# Patient Record
Sex: Female | Born: 1960 | Race: White | Hispanic: No | Smoking: Never smoker
Health system: Southern US, Community
[De-identification: ages and names within clinical notes are randomized; demographics above are authoritative.]

## PROBLEM LIST (undated history)

## (undated) DIAGNOSIS — E039 Hypothyroidism, unspecified: Secondary | ICD-10-CM

## (undated) DIAGNOSIS — F419 Anxiety disorder, unspecified: Secondary | ICD-10-CM

## (undated) DIAGNOSIS — N939 Abnormal uterine and vaginal bleeding, unspecified: Secondary | ICD-10-CM

## (undated) DIAGNOSIS — M1990 Unspecified osteoarthritis, unspecified site: Secondary | ICD-10-CM

## (undated) DIAGNOSIS — T7840XA Allergy, unspecified, initial encounter: Secondary | ICD-10-CM

## (undated) DIAGNOSIS — D649 Anemia, unspecified: Secondary | ICD-10-CM

## (undated) DIAGNOSIS — G4700 Insomnia, unspecified: Secondary | ICD-10-CM

## (undated) DIAGNOSIS — E079 Disorder of thyroid, unspecified: Secondary | ICD-10-CM

## (undated) DIAGNOSIS — R269 Unspecified abnormalities of gait and mobility: Secondary | ICD-10-CM

## (undated) DIAGNOSIS — F32A Depression, unspecified: Secondary | ICD-10-CM

## (undated) DIAGNOSIS — M810 Age-related osteoporosis without current pathological fracture: Secondary | ICD-10-CM

## (undated) DIAGNOSIS — F329 Major depressive disorder, single episode, unspecified: Secondary | ICD-10-CM

## (undated) DIAGNOSIS — Z5189 Encounter for other specified aftercare: Secondary | ICD-10-CM

## (undated) HISTORY — DX: Anemia, unspecified: D64.9

## (undated) HISTORY — DX: Disorder of thyroid, unspecified: E07.9

## (undated) HISTORY — DX: Depression, unspecified: F32.A

## (undated) HISTORY — DX: Insomnia, unspecified: G47.00

## (undated) HISTORY — DX: Major depressive disorder, single episode, unspecified: F32.9

## (undated) HISTORY — PX: OTHER SURGICAL HISTORY: SHX169

## (undated) HISTORY — PX: DILATION AND CURETTAGE OF UTERUS: SHX78

## (undated) HISTORY — PX: BACK SURGERY: SHX140

## (undated) HISTORY — DX: Encounter for other specified aftercare: Z51.89

## (undated) HISTORY — DX: Age-related osteoporosis without current pathological fracture: M81.0

## (undated) HISTORY — PX: LAPAROSCOPY ABDOMEN DIAGNOSTIC: PRO50

## (undated) HISTORY — DX: Allergy, unspecified, initial encounter: T78.40XA

## (undated) HISTORY — PX: APPENDECTOMY: SHX54

## (undated) HISTORY — DX: Unspecified osteoarthritis, unspecified site: M19.90

## (undated) HISTORY — PX: LUMBAR DISC SURGERY: SHX700

## (undated) HISTORY — PX: SHOULDER SURGERY: SHX246

## (undated) HISTORY — DX: Anxiety disorder, unspecified: F41.9

---
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
## 1998-04-27 ENCOUNTER — Emergency Department (HOSPITAL_COMMUNITY): Admission: EM | Admit: 1998-04-27 | Discharge: 1998-04-27 | Payer: Self-pay | Admitting: Internal Medicine

## 1998-06-08 ENCOUNTER — Ambulatory Visit (HOSPITAL_COMMUNITY): Admission: RE | Admit: 1998-06-08 | Discharge: 1998-06-08 | Payer: Self-pay | Admitting: Obstetrics and Gynecology

## 1998-12-29 ENCOUNTER — Encounter: Payer: Self-pay | Admitting: Obstetrics and Gynecology

## 1998-12-29 ENCOUNTER — Inpatient Hospital Stay (HOSPITAL_COMMUNITY): Admission: RE | Admit: 1998-12-29 | Discharge: 1998-12-29 | Payer: Self-pay | Admitting: Obstetrics and Gynecology

## 1999-04-30 ENCOUNTER — Encounter (INDEPENDENT_AMBULATORY_CARE_PROVIDER_SITE_OTHER): Payer: Self-pay

## 1999-04-30 ENCOUNTER — Ambulatory Visit (HOSPITAL_COMMUNITY): Admission: AD | Admit: 1999-04-30 | Discharge: 1999-04-30 | Payer: Self-pay | Admitting: Obstetrics and Gynecology

## 1999-09-23 ENCOUNTER — Encounter: Admission: RE | Admit: 1999-09-23 | Discharge: 1999-09-23 | Payer: Self-pay | Admitting: Obstetrics and Gynecology

## 1999-09-23 ENCOUNTER — Encounter: Payer: Self-pay | Admitting: Obstetrics and Gynecology

## 1999-10-08 ENCOUNTER — Ambulatory Visit (HOSPITAL_COMMUNITY): Admission: RE | Admit: 1999-10-08 | Discharge: 1999-10-08 | Payer: Self-pay | Admitting: Obstetrics and Gynecology

## 1999-10-21 ENCOUNTER — Ambulatory Visit (HOSPITAL_COMMUNITY): Admission: RE | Admit: 1999-10-21 | Discharge: 1999-10-21 | Payer: Self-pay | Admitting: Neurosurgery

## 1999-10-21 ENCOUNTER — Encounter: Payer: Self-pay | Admitting: Neurosurgery

## 1999-10-28 ENCOUNTER — Encounter: Payer: Self-pay | Admitting: Neurosurgery

## 1999-10-29 ENCOUNTER — Inpatient Hospital Stay (HOSPITAL_COMMUNITY): Admission: RE | Admit: 1999-10-29 | Discharge: 1999-10-31 | Payer: Self-pay | Admitting: Neurosurgery

## 1999-10-29 ENCOUNTER — Encounter: Payer: Self-pay | Admitting: Neurosurgery

## 1999-12-12 ENCOUNTER — Other Ambulatory Visit: Admission: RE | Admit: 1999-12-12 | Discharge: 1999-12-12 | Payer: Self-pay | Admitting: Obstetrics and Gynecology

## 2000-11-26 ENCOUNTER — Encounter: Admission: RE | Admit: 2000-11-26 | Discharge: 2000-11-26 | Payer: Self-pay | Admitting: Obstetrics and Gynecology

## 2000-11-26 ENCOUNTER — Encounter: Payer: Self-pay | Admitting: Obstetrics and Gynecology

## 2001-02-09 ENCOUNTER — Other Ambulatory Visit: Admission: RE | Admit: 2001-02-09 | Discharge: 2001-02-09 | Payer: Self-pay | Admitting: Obstetrics and Gynecology

## 2001-09-08 ENCOUNTER — Other Ambulatory Visit: Admission: RE | Admit: 2001-09-08 | Discharge: 2001-09-08 | Payer: Self-pay | Admitting: Obstetrics and Gynecology

## 2002-04-06 ENCOUNTER — Inpatient Hospital Stay (HOSPITAL_COMMUNITY): Admission: AD | Admit: 2002-04-06 | Discharge: 2002-04-08 | Payer: Self-pay | Admitting: Obstetrics and Gynecology

## 2002-05-01 ENCOUNTER — Other Ambulatory Visit: Admission: RE | Admit: 2002-05-01 | Discharge: 2002-05-01 | Payer: Self-pay | Admitting: Obstetrics and Gynecology

## 2002-08-27 ENCOUNTER — Emergency Department (HOSPITAL_COMMUNITY): Admission: EM | Admit: 2002-08-27 | Discharge: 2002-08-27 | Payer: Self-pay | Admitting: Emergency Medicine

## 2003-09-18 ENCOUNTER — Other Ambulatory Visit: Admission: RE | Admit: 2003-09-18 | Discharge: 2003-09-18 | Payer: Self-pay | Admitting: Obstetrics and Gynecology

## 2003-10-24 ENCOUNTER — Encounter: Admission: RE | Admit: 2003-10-24 | Discharge: 2003-10-24 | Payer: Self-pay | Admitting: Obstetrics and Gynecology

## 2005-06-01 ENCOUNTER — Encounter: Admission: RE | Admit: 2005-06-01 | Discharge: 2005-06-01 | Payer: Self-pay | Admitting: Obstetrics and Gynecology

## 2006-04-12 ENCOUNTER — Emergency Department (HOSPITAL_COMMUNITY): Admission: EM | Admit: 2006-04-12 | Discharge: 2006-04-12 | Payer: Self-pay | Admitting: Emergency Medicine

## 2006-04-15 ENCOUNTER — Encounter: Admission: RE | Admit: 2006-04-15 | Discharge: 2006-04-15 | Payer: Self-pay | Admitting: Endocrinology

## 2006-07-05 ENCOUNTER — Encounter: Admission: RE | Admit: 2006-07-05 | Discharge: 2006-07-05 | Payer: Self-pay | Admitting: Obstetrics and Gynecology

## 2007-03-28 ENCOUNTER — Emergency Department (HOSPITAL_COMMUNITY): Admission: EM | Admit: 2007-03-28 | Discharge: 2007-03-28 | Payer: Self-pay | Admitting: Emergency Medicine

## 2007-06-14 ENCOUNTER — Encounter: Admission: RE | Admit: 2007-06-14 | Discharge: 2007-06-14 | Payer: Self-pay | Admitting: Obstetrics and Gynecology

## 2007-07-19 ENCOUNTER — Encounter: Admission: RE | Admit: 2007-07-19 | Discharge: 2007-07-19 | Payer: Self-pay | Admitting: Obstetrics and Gynecology

## 2008-08-07 ENCOUNTER — Encounter: Admission: RE | Admit: 2008-08-07 | Discharge: 2008-08-07 | Payer: Self-pay | Admitting: Obstetrics and Gynecology

## 2010-06-09 ENCOUNTER — Encounter: Admission: RE | Admit: 2010-06-09 | Discharge: 2010-06-09 | Payer: Self-pay | Admitting: Obstetrics and Gynecology

## 2010-11-09 ENCOUNTER — Encounter: Payer: Self-pay | Admitting: Obstetrics and Gynecology

## 2011-03-06 NOTE — H&P (Signed)
[place]. [facility]  Patient:    Carla [name]                    MRN: [id number] Adm. Date:  [id number] Attending:  Barton Fanny Dictator:   Hewitt Shorts, M.D. CC:         Hewitt Shorts, M.D.                         History and Physical  CHIEF COMPLAINT:  The patient is a 50 year old right handed white female who was evaluated for lumbar radiculopathy secondary to a large left L5-S1 lumbar disk herniation superimposed on underlying degenerative disk disease.  HISTORY OF PRESENT ILLNESS:  The patient claims that in October 1999 she was doing some sanding and refinishing in her barn and developed some discomfort in her back. It is primarily sacral.  She used some Cataflam and muscle relaxants and did some stretching and improved until the summer of 2000.  In July 2000 she suffered a miscarriage.  Subsequently she resumed horseback riding and in September of 2000 she developed some sacral pain once again.  The pain extended into the left thigh and became more severe, and she was seen by a local orthopedist.  X-rays were done which showed narrowing of the L5-S1 disk space, and she was referred to physical therapy for McKenzie exercises.  Over the Thanksgiving weekend she was visiting family and was sitting in a recliner in a lounge chair-like position and when she got up from the recliner she had severe sciatic pain from the left side of her low back into her left buttock. he thought she might be pregnant but because the pain persisted she sought medical  evaluation.  She describes pain from the left side of her low back that extends to the left buttock.  She denies any midline low back pain.  She has occasional tightness in the left calf and numbness in the left calf and in the left sole of her foot, and weakness in the left lower extremity.  She does walk with a slight limp.  She denies any right lower extremity pain.   She finds that sitting tend o aggravate her pain, standing and walking are okay.  She finds that coughing, sneezing, and yelling tend to aggravate her pain.  She has been uncomfortable when she lays down, but she has gotten a new pillow that seems to make it easier and  more comfortable when she lays down.  PAST MEDICAL HISTORY:  Notable for a significant history of infertility.  Her obstetrician is Dr. Billy Coast with [facility] and her infertility specialist s Dr. Elesa Hacker at [facility].  She suffered a miscarriage this  past summer that genetics revealed a Turners syndrome.  She suffered another miscarriage last month and the genetics are pending.  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  She has a history of hypothyroidism.  There is no history of hypertension, myocardial infarction, cancer, stroke, diabetes, peptic ulcer disease, or lung disease.  PAST SURGICAL HISTORY:  Previous surgery includes tonsillectomy, adenoidectomy;  both as an early child, laparotomy, hysteroscopy in August 1999, D&Cs in July and December of 2000.  ALLERGIES:  She reports allergies to ERYTHROMYCIN AND CODEINE.  CURRENT  MEDICATIONS: 1.  Synthroid 0.1 mg q.d. for hypothyroidism. 2.  Zoloft 50 mg q.d. for depression, which is mild. 3.  Vioxx 25 mg q.d. for back pain. 4.  Prenatal -, a prenatal vitamin which she takes daily.  FAMILY HISTORY:  Mother is in good health at age 72.  Her father is in very poor health at age 88 due to a cortical basal ganglia degeneration in the late stage.  SOCIAL HISTORY:  The patient is married.  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  Her husband is working.  She does not smoke.   She drinks alcoholic beverages socially.  She denies a history of substance abuse.  REVIEW OF SYSTEMS:  Notable for those discomforts described in the History of Present Illness and Past Medical History, but otherwise unremarkable.  PHYSICAL EXAMINATION:  GENERAL:  The patient is a well-developed, well-nourished white female in no acute distress.  VITAL SIGNS:  Temperature 97.3, pulse 91, blood pressure 93/55, respiratory rate 20.  Weight 125 pounds.  Height 5 feet 5 inches.  LUNGS:  Clear to auscultation.  She had symmetrical respiratory excursion.  HEART:  Regular rate and rhythm.  Normal S1, S2.  No murmur.  ABDOMEN:  Soft, nondistended.  Bowel sounds present.  EXTREMITIES:  No clubbing, cyanosis, or edema.  MUSCULOSKELETAL:  No tenderness over the lumbosacral spinous process or right columnar musculature, but there is some discomfort to palpation of the left columnar musculature.  She is limited in forward flexion to about 60 degrees due to discomfort.  She is able to extend well.  Straight leg raising is positive on the left at 60 degrees, with pain shooting into the left buttock.  Straight leg raising is negative on the right.  NEUROLOGIC:  Weakness of the left dorsiflexor and extensor hallucis longus, the  left dorsiflexor is 4/5 and the left extensor hallucis longus is 3 to 4-/5. There is a hint of weakness of the left plantar flexor, 5-/5.  The right lower extremity shows full strength through the dorsiflexor, plantar flexor, and extensor hallucis longus.  Sensation is intact to pinprick to the lower extremities.  Reflexes are 2 at the quadriceps, 1 at the gastrocnemius and are symmetric bilaterally.  Toes downgoing bilaterally.  Gait shows mild circumduction of her left foot as she walks.  She has a normal stance.  DIAGNOSTIC STUDIES:  Lumbosacral spine MRI done without gadolinium and x-rays with flexion and extensor views were available for review and  there is significant degeneration at the L5-S1 disk level with disk space narrowing and sclerosis of the end plates, circumferential disk bulging, but the most significant finding is of an  enormous central left L5-S1 lumbar disk herniation.  IMPRESSION: 1.  Left lumbar radiculopathy secondary to a large left L5-S1 lumbar disk     herniation superimposed upon underlying degenerative disk disease at     L5-S1.  PLAN:  The patient is admitted for a left L5-S1 lumbar laminotomy and microdiskectomy.  We discussed the nature of surgery to include surgery, hospital stay, and overall recuperation and limitations during the postoperative period, and risks of surgery to include risk of infection, bleeding, possible need for transfusion, risk of nerve dysfunction with pain, weakness, numbness, or paresthesia, the risk of dural tear and CSF leak and possible need for further surgery, risk of recurrent disk herniation and possible need for further surgery, and anesthetic  risks of myocardial infarction, stroke, pneumonia, and death. Understanding all this the patient does wish to proceed with surgery and is admitted for such. DD:  10/29/99 TD:  10/29/99 Job: [id number]

## 2011-03-06 NOTE — Op Note (Signed)
[place]. [facility]  Patient:    Carla [name]                    MRN: [id number] Proc. Date: 10/29/99 Adm. Date:  [id number] Attending:  Barton Fanny CC:         Hewitt Shorts, M.D.                           Operative Report  PREOPERATIVE DIAGNOSIS:  Left L5-S1 lumbar disk herniation.  POSTOPERATIVE DIAGNOSIS:  Left L5-S1 lumbar disk herniation.  OPERATION PERFORMED:  Left L5-S1 lumbar laminotomy and microdiskectomy.  SURGEON:  Hewitt Shorts, M.D.  ASSISTANT:  Mena Goes. Franky Macho, M.D.  ANESTHESIA:  General endotracheal.  INDICATIONS FOR PROCEDURE:  The patient is a 50 year old woman who presented with a left lumbar radiculopathy with evidence of both L5 and S1 nerve root dysfunction and he was admitted for an elective laminotomy and microdiskectomy for disk herniation.  DESCRIPTION OF PROCEDURE:  The patient was brought to the operating room and placed under general endotracheal anesthesia.  The patient was turned to a prone position, lumbar region was prepped with Betadine soap and solution and draped in a sterile fashion.  The midline was infiltrated with local anesthetic with epinephrine, a  localizing x-ray taken and the L5-S1 level identified.  A midline incision was ade and carried down through subcutaneous tissue.  Bipolar cautery and electrocautery were used to maintain hemostasis.  Dissection was carried down to the lumbar fascia which was incised on the left side of the midline and paraspinal muscles were dissected from the spinous process and lamina in a subperiosteal fashion.  The L5-S1 level was identified and confirmed with x-ray localization and then a laminotomy was performed using the Midas Rex drill with an AM-8 bur and Kerrison punches.  The ligamentum flavum was carefully removed and the underlying thecal sac and nerve root identified.  The microscope was draped and brought into the field  to provide additional magnification, illumination and visualization.  The remainder of the procedure was performed using microdissection technique.  We carefully dissected in the epidural space and encountered immediately several fragments of disk herniation.  The disk herniation itself was quite large and it was anticipated that this large disk herniation would be removed in a piecemeal fashion.  There was moderate spondylitic overgrowth posteriorly from the posterior aspects of L5 and S1 and this was carefully removed and we gradually removed numerous small fragments of disk herniation.  We identified the left L5 and left S1 nerve roots as well as he thecal sac.  Gradually we were able to remove all loose fragments of disk material that had herniated in the epidural space.  We also entered the disk space and removed additional degenerated disk material.  In addition spondylitic overgrowth on the posterior aspect of L5 and S1 was removed.  In doing so, we were able to  decompress the thecal sac and left L5 and S1 nerve roots and all loose fragments of disk material were removed from both the disk space and the epidural space. Once hemostasis was established with the use of Gelfoam and thrombin, all the Gelfoam was removed and then 2 cc of fentanyl and 80 mg of Depo-Medrol were infused into the epidural space.  We then proceeded with closure.  The deep fascia closed with interrupted undyed 0 Vicryl sutures, the subcutaneous layer was closed with interrupted inverted 2-0  undyed Vicryl sutures and the subcuticular layer was closed with interrupted inverted 3-0 undyed Vicryl sutures and the skin edges were approximated with Steri-Strips.  The wound was dressed with Adaptic and sterile  gauze.  The patient tolerated the procedure well.  Estimated blood loss was less than 50 cc.  Sponge and instrument counts were correct.  Following surgery, the  patient was turned back to a  supine position to be reversed from the anesthetic and transferred to the recovery room for further care. DD:  10/29/99 TD:  10/29/99 Job: [id number]

## 2011-03-06 NOTE — H&P (Signed)
[facility]  Patient:    Carla [name], Carla [name] Visit Number: [id number] MRN: [id number]          Service Type: OBS Location: 910A [id number] [id number] Attending Physician:  Genia Del Dictated by:   Lenoard Aden, M.D. Admit Date:  04/06/2002                           History and Physical  CHIEF COMPLAINT:              Labor.  HISTORY OF PRESENT ILLNESS:   The patient is a 50 year old white female, G3, P0, EDD of April 22, 2002 who presents in active labor.  PAST MEDICAL HISTORY:         Remarkable for myomectomy hysteroscopically resected, history of HSV, history of hypothyroidism, history of depression.  MEDICATIONS:                  Prenatal vitamins, thyroid medication, Effexor and Valtrex.  FAMILY HISTORY:               Heart disease, hypertension and schizophrenia, personal history of irritable bowel syndrome as well.  PHYSICAL EXAMINATION:  GENERAL:                      She is a well-developed, well-nourished, white female in no apparent distress.  HEENT:                        Normal.  LUNGS:                        Clear.  HEART:                        Regular rhythm.  ABDOMEN:                      Soft, gravid, nontender. Estimated fetal weight is 7-1/2 pounds. Cervix is 3-4 cm, 100% vertex and +1.  IMPRESSION:                   Term intrauterine pregnancy in active labor.  PLAN:                         Proceed with anticipated attempts at vaginal delivery, epidural anesthesia and Pitocin augmentation as needed. Dictated by:   Lenoard Aden, M.D. Attending Physician:  Genia Del DD:  04/06/02 TD:  04/06/02 Job: [id number]

## 2011-03-08 ENCOUNTER — Emergency Department (HOSPITAL_COMMUNITY)
Admission: EM | Admit: 2011-03-08 | Discharge: 2011-03-08 | Disposition: A | Discharge status: Home / Self Care | Payer: BC Managed Care – PPO | Attending: Emergency Medicine | Admitting: Emergency Medicine

## 2011-03-08 DIAGNOSIS — W269XXA Contact with unspecified sharp object(s), initial encounter: Secondary | ICD-10-CM | POA: Insufficient documentation

## 2011-03-08 DIAGNOSIS — S91309A Unspecified open wound, unspecified foot, initial encounter: Secondary | ICD-10-CM | POA: Insufficient documentation

## 2011-03-08 DIAGNOSIS — S81009A Unspecified open wound, unspecified knee, initial encounter: Secondary | ICD-10-CM | POA: Insufficient documentation

## 2011-04-14 ENCOUNTER — Encounter: Payer: Self-pay | Admitting: Internal Medicine

## 2011-04-14 ENCOUNTER — Ambulatory Visit (AMBULATORY_SURGERY_CENTER): Payer: BC Managed Care – PPO | Admitting: *Deleted

## 2011-04-14 VITALS — Ht 64.5 in | Wt 134.0 lb

## 2011-04-14 DIAGNOSIS — Z1211 Encounter for screening for malignant neoplasm of colon: Secondary | ICD-10-CM

## 2011-04-14 MED ORDER — PEG-KCL-NACL-NASULF-NA ASC-C 100 G PO SOLR: ORAL | Status: DC

## 2011-04-28 ENCOUNTER — Encounter: Payer: Self-pay | Admitting: Internal Medicine

## 2011-04-28 ENCOUNTER — Ambulatory Visit (AMBULATORY_SURGERY_CENTER): Payer: BC Managed Care – PPO | Admitting: Internal Medicine

## 2011-04-28 VITALS — BP 102/60 | HR 68 | Temp 97.8°F | Resp 16 | Ht 64.5 in | Wt 135.0 lb

## 2011-04-28 DIAGNOSIS — Z1211 Encounter for screening for malignant neoplasm of colon: Secondary | ICD-10-CM

## 2011-04-28 HISTORY — PX: COLONOSCOPY: SHX174

## 2011-04-28 MED ORDER — SODIUM CHLORIDE 0.9 % IV SOLN: 500.0000 mL | INTRAVENOUS | Status: DC

## 2011-04-28 NOTE — Patient Instructions (Signed)
Please review all discharge papers given to you by the recovery room nurse.  Dr Leone Payor is recommending a repeat colonoscopy in ten years. Please call [phone] if you experience any problems once you are discharged today.  We will give you a call in the am to see how you are doing and if you have any questions.

## 2011-04-29 ENCOUNTER — Telehealth: Payer: Self-pay | Admitting: *Deleted

## 2011-04-29 NOTE — Telephone Encounter (Signed)

## 2011-12-28 ENCOUNTER — Other Ambulatory Visit: Payer: Self-pay | Admitting: Obstetrics and Gynecology

## 2011-12-28 DIAGNOSIS — Z1231 Encounter for screening mammogram for malignant neoplasm of breast: Secondary | ICD-10-CM

## 2012-01-01 ENCOUNTER — Ambulatory Visit
Admission: RE | Admit: 2012-01-01 | Discharge: 2012-01-01 | Disposition: A | Discharge status: Home / Self Care | Payer: BC Managed Care – PPO | Source: Ambulatory Visit | Attending: Obstetrics and Gynecology | Admitting: Obstetrics and Gynecology

## 2012-01-01 DIAGNOSIS — Z1231 Encounter for screening mammogram for malignant neoplasm of breast: Secondary | ICD-10-CM

## 2012-12-27 ENCOUNTER — Other Ambulatory Visit: Payer: Self-pay

## 2012-12-27 DIAGNOSIS — Z1231 Encounter for screening mammogram for malignant neoplasm of breast: Secondary | ICD-10-CM

## 2013-02-02 ENCOUNTER — Ambulatory Visit: Payer: BC Managed Care – PPO

## 2013-02-24 ENCOUNTER — Other Ambulatory Visit: Payer: Self-pay | Admitting: Obstetrics and Gynecology

## 2013-02-24 DIAGNOSIS — N6325 Unspecified lump in the left breast, overlapping quadrants: Secondary | ICD-10-CM

## 2013-03-08 ENCOUNTER — Ambulatory Visit
Admission: RE | Admit: 2013-03-08 | Discharge: 2013-03-08 | Disposition: A | Discharge status: Home / Self Care | Payer: BC Managed Care – PPO | Source: Ambulatory Visit | Attending: Obstetrics and Gynecology | Admitting: Obstetrics and Gynecology

## 2013-03-08 DIAGNOSIS — N6325 Unspecified lump in the left breast, overlapping quadrants: Secondary | ICD-10-CM

## 2013-07-06 ENCOUNTER — Other Ambulatory Visit: Payer: Self-pay | Admitting: Obstetrics and Gynecology

## 2013-07-06 DIAGNOSIS — Z780 Asymptomatic menopausal state: Secondary | ICD-10-CM

## 2013-07-06 DIAGNOSIS — N951 Menopausal and female climacteric states: Secondary | ICD-10-CM

## 2013-08-23 ENCOUNTER — Ambulatory Visit
Admission: RE | Admit: 2013-08-23 | Discharge: 2013-08-23 | Disposition: A | Discharge status: Home / Self Care | Payer: BC Managed Care – PPO | Source: Ambulatory Visit | Attending: Obstetrics and Gynecology | Admitting: Obstetrics and Gynecology

## 2013-08-23 DIAGNOSIS — Z780 Asymptomatic menopausal state: Secondary | ICD-10-CM

## 2013-09-04 ENCOUNTER — Encounter: Payer: Self-pay | Admitting: Podiatry

## 2013-09-04 ENCOUNTER — Ambulatory Visit (INDEPENDENT_AMBULATORY_CARE_PROVIDER_SITE_OTHER): Payer: BC Managed Care – PPO | Admitting: Podiatry

## 2013-09-04 ENCOUNTER — Ambulatory Visit (INDEPENDENT_AMBULATORY_CARE_PROVIDER_SITE_OTHER): Payer: BC Managed Care – PPO

## 2013-09-04 VITALS — BP 101/60 | HR 74 | Resp 12 | Ht 65.0 in | Wt 139.0 lb

## 2013-09-04 DIAGNOSIS — M2020 Hallux rigidus, unspecified foot: Secondary | ICD-10-CM

## 2013-09-04 DIAGNOSIS — R52 Pain, unspecified: Secondary | ICD-10-CM

## 2013-09-04 DIAGNOSIS — M779 Enthesopathy, unspecified: Secondary | ICD-10-CM

## 2013-09-04 DIAGNOSIS — M2021 Hallux rigidus, right foot: Secondary | ICD-10-CM

## 2013-09-04 MED ORDER — MELOXICAM 15 MG PO TABS: 15.0000 mg | ORAL_TABLET | Freq: Every day | ORAL | Status: DC

## 2013-09-04 MED ORDER — TRIAMCINOLONE ACETONIDE 10 MG/ML IJ SUSP
5.0000 mg | Freq: Once | INTRAMUSCULAR | Status: AC
  Administered 2013-09-04: 5 mg via INTRA_ARTICULAR

## 2013-09-04 NOTE — Progress Notes (Signed)
Subjective:     Patient ID: Carla [name], female   DOB: 1960/11/15, 52 y.o.   MRN: [id number]  Foot Pain   patient presents stating that my big toe joint of my right foot has been hurting me for approximately 1 year. States that she wore a high-heeled shoe and that's when it seemed to start but it has been giving her trouble since   Review of Systems  All other systems reviewed and are negative.       Objective:   Physical Exam  Nursing note and vitals reviewed. Constitutional: She is oriented to person, place, and time.  Cardiovascular: Intact distal pulses.   Musculoskeletal: Normal range of motion.  Neurological: She is oriented to person, place, and time.  Skin: Skin is warm.   patient is found to have pain in the right first MPJ with good range of motion but diminished over the left foot. There is no crepitus within the joint or indications of restriction. 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 At this time I injected the first MPJ 3 mg Kenalog 5 mg Xylocaine Marcaine mixture and discussed long-term orthotics to reduce stress against the joint. Did explain at one point in the future this may require hallux limitus repair

## 2013-09-04 NOTE — Progress Notes (Signed)
N- SORE L-RT FOOT  D-2 MONTHS O-SLOWLY C-WORSE A-WALKING T-INSERTS

## 2013-09-04 NOTE — Progress Notes (Signed)
  Subjective:    Patient ID: Carla [name], female    DOB: 1960-12-27, 52 y.o.   MRN: [id number]  HPI    Review of Systems  Musculoskeletal: Positive for gait problem and joint swelling.  All other systems reviewed and are negative.       Objective:   Physical Exam        Assessment & Plan:

## 2013-09-27 ENCOUNTER — Ambulatory Visit: Payer: BC Managed Care – PPO | Admitting: Podiatry

## 2014-03-15 ENCOUNTER — Other Ambulatory Visit: Payer: Self-pay

## 2014-03-15 DIAGNOSIS — Z1231 Encounter for screening mammogram for malignant neoplasm of breast: Secondary | ICD-10-CM

## 2014-04-04 ENCOUNTER — Ambulatory Visit
Admission: RE | Admit: 2014-04-04 | Discharge: 2014-04-04 | Disposition: A | Discharge status: Home / Self Care | Payer: BC Managed Care – PPO | Source: Ambulatory Visit

## 2014-04-04 ENCOUNTER — Encounter (INDEPENDENT_AMBULATORY_CARE_PROVIDER_SITE_OTHER): Payer: Self-pay

## 2014-04-04 DIAGNOSIS — Z1231 Encounter for screening mammogram for malignant neoplasm of breast: Secondary | ICD-10-CM

## 2014-04-30 ENCOUNTER — Telehealth: Payer: Self-pay | Admitting: *Deleted

## 2014-04-30 ENCOUNTER — Ambulatory Visit (INDEPENDENT_AMBULATORY_CARE_PROVIDER_SITE_OTHER): Payer: BC Managed Care – PPO | Admitting: Podiatry

## 2014-04-30 ENCOUNTER — Encounter: Payer: Self-pay | Admitting: Podiatry

## 2014-04-30 ENCOUNTER — Ambulatory Visit (INDEPENDENT_AMBULATORY_CARE_PROVIDER_SITE_OTHER): Payer: BC Managed Care – PPO

## 2014-04-30 VITALS — BP 110/63 | HR 88 | Resp 13 | Ht 64.5 in | Wt 130.0 lb

## 2014-04-30 DIAGNOSIS — R52 Pain, unspecified: Secondary | ICD-10-CM

## 2014-04-30 DIAGNOSIS — M8448XA Pathological fracture, other site, initial encounter for fracture: Secondary | ICD-10-CM

## 2014-04-30 NOTE — Progress Notes (Signed)
   Subjective:    Patient ID: Carla [name], female    DOB: 08/05/1961, 53 y.o.   MRN: [id number]  HPI Comments: N forefoot pain L right forefoot MPJ areas D 2.5 weeks O day before the long drive, after gardening and housework C sharp pain in right 2, 3rd MPJ area and squeezing pain in midfoot A strapped shoes, and pressure from side-to-side T Urgent Care diagnosed with Metatarsal algia and prescribed Prednisone 6 day pack, and hard bottom shoe and Ibuprofen 600mg  every 6 to 12 hours, and occasional Mobic  Foot Pain      Review of Systems  All other systems reviewed and are negative.      Objective:   Physical Exam  Orientated x3 white female  Vascular: DP and PT pulses 2/4 bilaterally  Neurological: Ankle reflexes equal and reactive bilaterally Vibratory sensation intact bilaterally Sensation to 10 g monofilament wire intact 5/5 bilaterally  Dermatological: Low-grade edema localized over the bases second third right metatarsal area  Musculoskeletal: Exquisite palpable tenderness to his last visit the third right metatarsal without crepitus  X-ray examination right foot  Fracture line distal aspect of the third metatarsal shaft with slight angular displacement  Radiographic impression:  Stress fracture third right metatarsal        Assessment & Plan:   Assessment: Stress fracture third right metatarsal  Plan: Patient advised that she has stress fracture third right metatarsal Limits staining walking Dispensed short walking boot  Reappoint x4 weeks for progress x-ray

## 2014-04-30 NOTE — Telephone Encounter (Signed)
I saw him this morning.  I need clarification how to wear the air cam boot.  Call me back on my cell.  I returned her call.  She asked what angle is she supposed to have the foot, how much air to pump into it.  I advised her to pump it about 15-20 times.  She asked if it was okay to leave the boot on while she's relaxing because she is up and down, single mom.  I told her yes, it is okay to keep the boot on while relaxing.  She asked if she can take it off at night.  I told her yes.  She asked if it was okay to take it off to shower.  I told her yes.  She asked if she had to sit while showering.  I told her it's not necessary.  She stated she would do her best to keep the weight off it.

## 2014-05-01 ENCOUNTER — Encounter: Payer: Self-pay | Admitting: Podiatry

## 2014-05-01 ENCOUNTER — Telehealth: Payer: Self-pay | Admitting: *Deleted

## 2014-05-01 NOTE — Telephone Encounter (Signed)
[facility] Theotis Bur[facility]man[facility] nurse?  I'm still not clear how to put this boot on.  I want to go over exactly what she told me today.

## 2014-05-01 NOTE — Telephone Encounter (Signed)
[facility], because she was unable to get around in the boot.  Pt states her 3rd metatarsal is broken all the way through and she shouldn't be weight bearing, and will be using a knee scooter from their office.  Pt states the PA wants to x-ray the foot weekly, and she will follow up with their office.

## 2014-05-07 ENCOUNTER — Ambulatory Visit: Payer: BC Managed Care – PPO | Admitting: Podiatry

## 2014-05-28 ENCOUNTER — Ambulatory Visit: Payer: BC Managed Care – PPO | Admitting: Podiatry

## 2014-06-25 ENCOUNTER — Ambulatory Visit (HOSPITAL_COMMUNITY)
Admission: RE | Admit: 2014-06-25 | Discharge: 2014-06-25 | Disposition: A | Discharge status: Home / Self Care | Payer: BC Managed Care – PPO | Attending: Psychiatry | Admitting: Psychiatry

## 2014-06-25 NOTE — BH Assessment (Signed)
Patient presented to [facility] as a "walk-in," complains of "insomnia because my outpatient physician changed my antidepressant and he is not available today."  Patient verbalizes "I am sleeping about six hours per night." Patient not willing to wait for assessment by TTS counselor. Patient denies SI, HI and AVH, patient signed "no harm contract." Patient refused MSE, signed informed consent. Patient states "I will follow up with Dr Westley Chandler tomorrow."

## 2014-06-28 ENCOUNTER — Encounter (HOSPITAL_COMMUNITY): Payer: Self-pay | Admitting: Emergency Medicine

## 2014-06-28 ENCOUNTER — Emergency Department (HOSPITAL_COMMUNITY)
Admission: EM | Admit: 2014-06-28 | Discharge: 2014-06-28 | Disposition: A | Discharge status: Home / Self Care | Payer: BC Managed Care – PPO | Attending: Emergency Medicine | Admitting: Emergency Medicine

## 2014-06-28 DIAGNOSIS — M129 Arthropathy, unspecified: Secondary | ICD-10-CM | POA: Insufficient documentation

## 2014-06-28 DIAGNOSIS — Z733 Stress, not elsewhere classified: Secondary | ICD-10-CM | POA: Insufficient documentation

## 2014-06-28 DIAGNOSIS — Z79899 Other long term (current) drug therapy: Secondary | ICD-10-CM | POA: Diagnosis not present

## 2014-06-28 DIAGNOSIS — Z658 Other specified problems related to psychosocial circumstances: Secondary | ICD-10-CM

## 2014-06-28 DIAGNOSIS — IMO0002 Reserved for concepts with insufficient information to code with codable children: Secondary | ICD-10-CM | POA: Diagnosis not present

## 2014-06-28 DIAGNOSIS — G4700 Insomnia, unspecified: Secondary | ICD-10-CM

## 2014-06-28 DIAGNOSIS — M810 Age-related osteoporosis without current pathological fracture: Secondary | ICD-10-CM | POA: Insufficient documentation

## 2014-06-28 DIAGNOSIS — F3289 Other specified depressive episodes: Secondary | ICD-10-CM | POA: Diagnosis not present

## 2014-06-28 DIAGNOSIS — F411 Generalized anxiety disorder: Secondary | ICD-10-CM | POA: Insufficient documentation

## 2014-06-28 DIAGNOSIS — F329 Major depressive disorder, single episode, unspecified: Secondary | ICD-10-CM | POA: Diagnosis not present

## 2014-06-28 DIAGNOSIS — Z792 Long term (current) use of antibiotics: Secondary | ICD-10-CM | POA: Diagnosis not present

## 2014-06-28 DIAGNOSIS — E039 Hypothyroidism, unspecified: Secondary | ICD-10-CM | POA: Insufficient documentation

## 2014-06-28 DIAGNOSIS — Z8742 Personal history of other diseases of the female genital tract: Secondary | ICD-10-CM | POA: Diagnosis not present

## 2014-06-28 LAB — COMPREHENSIVE METABOLIC PANEL
ALT: 13 U/L (ref 0–35)
AST: 19 U/L (ref 0–37)
Albumin: 4.3 g/dL (ref 3.5–5.2)
Alkaline Phosphatase: 52 U/L (ref 39–117)
Anion gap: 12 (ref 5–15)
BUN: 8 mg/dL (ref 6–23)
CO2: 27 mEq/L (ref 19–32)
Calcium: 9.8 mg/dL (ref 8.4–10.5)
Chloride: 100 mEq/L (ref 96–112)
Creatinine, Ser: 0.84 mg/dL (ref 0.50–1.10)
GFR calc Af Amer: >90 mL/min (ref >90)
GFR calc non Af Amer: 78 mL/min — ABNORMAL LOW (ref >90)
Glucose, Bld: 126 mg/dL — ABNORMAL HIGH (ref 70–99)
Potassium: 4.5 mEq/L (ref 3.7–5.3)
Sodium: 139 mEq/L (ref 137–147)
Total Bilirubin: 0.3 mg/dL (ref 0.3–1.2)
Total Protein: 7.1 g/dL (ref 6.0–8.3)

## 2014-06-28 LAB — SALICYLATE LEVEL: Salicylate Lvl: <2 mg/dL — ABNORMAL LOW (ref 2.8–20.0)

## 2014-06-28 LAB — RAPID URINE DRUG SCREEN, HOSP PERFORMED
Amphetamines: NOT DETECTED
Barbiturates: NOT DETECTED
Benzodiazepines: NOT DETECTED
Cocaine: NOT DETECTED
Opiates: NOT DETECTED
Tetrahydrocannabinol: NOT DETECTED

## 2014-06-28 LAB — CBC
HCT: 42.3 % (ref 36.0–46.0)
Hemoglobin: 14 g/dL (ref 12.0–15.0)
MCH: 30.6 pg (ref 26.0–34.0)
MCHC: 33.1 g/dL (ref 30.0–36.0)
MCV: 92.6 fL (ref 78.0–100.0)
Platelets: 210 10*3/uL (ref 150–400)
RBC: 4.57 MIL/uL (ref 3.87–5.11)
RDW: 13 % (ref 11.5–15.5)
WBC: 4.3 10*3/uL (ref 4.0–10.5)

## 2014-06-28 LAB — ETHANOL: Alcohol, Ethyl (B): <11 mg/dL (ref 0–11)

## 2014-06-28 LAB — ACETAMINOPHEN LEVEL: Acetaminophen (Tylenol), Serum: <15 ug/mL (ref 10–30)

## 2014-06-28 NOTE — ED Notes (Signed)
MD at bedside. 

## 2014-06-28 NOTE — ED Notes (Addendum)
Pt unwilling to answer RN questions regarding difficulties at home and reasons for coming to the hospital. Pt just sts "I have anxiety and things at home are bad."

## 2014-06-28 NOTE — ED Provider Notes (Signed)
CSN: [id number]     Arrival date & time 06/28/14  1103 History   First MD Initiated Contact with Patient 06/28/14 1505     Chief Complaint  Patient presents with  . Anxiety     (Consider location/radiation/quality/duration/timing/severity/associated sxs/prior Treatment) HPI  Carla [name] is a 53 y.o. female who is here for evaluation of "serotonin syndrome". She states that she he developed a panic attack, with trouble breathing through her nose, last night after taking trazodone. This made her think that she was having a reaction, "serotonin syndrome". She vomited a few times, after that. She tried to go to sleep, but was only able to sleep about 1-1/2 hours. She has had chronic insomnia for several weeks. Her psychiatrist has changed her medications, recently, from Ativan to Xanax, and had her Lexapro discontinued. She remains unable to sleep. She has been communicating with her sister, who is here with her today, and has been open with her. Her sister feels like the patient is "not functioning well." She is concerned that the patient cannot manage herself. The patient states that she is worried about driving her children around, when she is taking Xanax. Her concern is that she might get into an accident. She denies suicidal ideation or intent at this time. She denies any consideration for harming her children or anyone else. She recently had an accidental laceration to her left index finger, which has been treated, with improvement, with oral Bactrim. She fractured her right foot, 3 months ago, and is doing better, wearing orthopedic devices, the direction of her orthopedist. She last saw her psychiatrist yesterday, and talked to her on the phone today. She is taking her medication as prescribed. There are no other known modifying factors.   Past Medical History  Diagnosis Date  . Allergy   . Anxiety   . Depression   . Arthritis   . Thyroid disease     hypothyroidism  . Endometriosis    . Osteoporosis    Past Surgical History  Procedure Laterality Date  . Dilation and curettage of uterus    . Laparoscopy abdomen diagnostic    . Lumbar disc surgery    . Left shoulder arthroscopy    . Cyst left 2nd finger    . Colonoscopy  04/28/11    Normal   No family history on file. History  Substance Use Topics  . Smoking status: Never Smoker   . Smokeless tobacco: Not on file  . Alcohol Use: 2.4 oz/week    4 Glasses of wine per week   OB History   Grav Para Term Preterm Abortions TAB SAB Ect Mult Living                 Review of Systems  All other systems reviewed and are negative.     Allergies  Clarithromycin; Codeine; Erythromycin; and Trazodone and nefazodone  Home Medications   Prior to Admission medications   Medication Sig Start Date End Date Taking? Authorizing Provider  acetaminophen (TYLENOL) 500 MG tablet Take 750 mg by mouth every 6 (six) hours as needed for moderate pain.   Yes Historical Provider, MD  ALPRAZolam (XANAX) 0.25 MG tablet Take 0.25 mg by mouth 3 (three) times daily as needed for anxiety.   Yes Historical Provider, MD  azelastine (ASTELIN) 137 MCG/SPRAY nasal spray Place 1 spray into the nose 2 (two) times daily as needed for allergies. Use in each nostril as directed   Yes Historical Provider, MD  calcium  citrate-vitamin D (CITRACAL+D) 315-200 MG-UNIT per tablet Take 1 tablet by mouth 2 (two) times daily.   Yes Historical Provider, MD  Cholecalciferol (VITAMIN D-3) 5000 UNITS TABS Take 5,000 Units by mouth daily.   Yes Historical Provider, MD  ibuprofen (ADVIL,MOTRIN) 200 MG tablet Take 200 mg by mouth every 6 (six) hours as needed.     Yes Historical Provider, MD  levothyroxine (SYNTHROID, LEVOTHROID) 112 MCG tablet Take 112 mcg by mouth daily before breakfast.   Yes Historical Provider, MD  liothyronine (CYTOMEL) 5 MCG tablet Take 5 mcg by mouth daily.     Yes Historical Provider, MD  Melatonin 5 MG TABS Take 5 mg by mouth at bedtime.    Yes Historical Provider, MD  mometasone (NASONEX) 50 MCG/ACT nasal spray Place 2 sprays into the nose daily.   Yes Historical Provider, MD  mupirocin cream (BACTROBAN) 2 % Apply 1 application topically 3 (three) times daily.   Yes Historical Provider, MD  sulfamethoxazole-trimethoprim (BACTRIM DS) 800-160 MG per tablet Take 1 tablet by mouth 2 (two) times daily.   Yes Historical Provider, MD  valACYclovir (VALTREX) 1000 MG tablet Take 500 mg by mouth daily.  02/05/11  Yes Historical Provider, MD  Vilazodone HCl 20 MG TABS Take 20 mg by mouth daily. Started dose pack two weeks ago and told to stay with  daily.   Yes Historical Provider, MD  zolpidem (AMBIEN) 10 MG tablet Take 2.5-5 mg by mouth at bedtime. Takes a quarter to a half a tab depending on how she feels.   Yes Historical Provider, MD   BP 113/60  Pulse 75  Temp(Src) 98.8 F (37.1 C) (Oral)  Resp 16  SpO2 100%  LMP 04/22/2011 Physical Exam  Nursing note and vitals reviewed. Constitutional: She is oriented to person, place, and time. She appears well-developed and well-nourished.  HENT:  Head: Normocephalic and atraumatic.  Eyes: Conjunctivae and EOM are normal. Pupils are equal, round, and reactive to light.  Neck: Normal range of motion and phonation normal. Neck supple.  Cardiovascular: Normal rate, regular rhythm and intact distal pulses.   Pulmonary/Chest: Effort normal and breath sounds normal. She exhibits no tenderness.  Abdominal: Soft. She exhibits no distension. There is no tenderness. There is no guarding.  Musculoskeletal: Normal range of motion.  Neurological: She is alert and oriented to person, place, and time. She exhibits normal muscle tone.  Normal Romberg. No ataxia. Normal gait.  Skin: Skin is warm and dry.  Psychiatric: Her behavior is normal. Judgment and thought content normal.  She appears depressed    ED Course  Procedures (including critical care time) Medications - No data to  display  Patient Vitals for the past 24 hrs:  BP Temp Temp src Pulse Resp SpO2  06/28/14 1536 113/60 mmHg - - 75 16 100 %  06/28/14 1126 117/71 mmHg 98.8 F (37.1 C) Oral 92 20 98 %    3:52 PM Reevaluation with update and discussion. After initial assessment and treatment, an updated evaluation reveals findings discussed with patient, and sister-in-law. All questions answered. The pt has a f/u appt. With her psychiatrist on 07/01/14. Janilah Hojnacki L    Labs Review Labs Reviewed  COMPREHENSIVE METABOLIC PANEL - Abnormal; Notable for the following:    Glucose, Bld 126 (*)    GFR calc non Af Amer 78 (*)    All other components within normal limits  SALICYLATE LEVEL - Abnormal; Notable for the following:    Salicylate Lvl <2.0 (*)  All other components within normal limits  ACETAMINOPHEN LEVEL  CBC  ETHANOL  URINE RAPID DRUG SCREEN (HOSP PERFORMED)    Imaging Review No results found.   EKG Interpretation None      MDM   Final diagnoses:  Insomnia  Psychosocial stressors   Depression, psychosocial stressors, anxiety, and progressive problems for several months. No apparent suicidality at this time. There is no evidence for serotonin syndrome. The patient is stable, psychiatrically.  Nursing Notes Reviewed/ Care Coordinated Applicable Imaging Reviewed Interpretation of Laboratory Data incorporated into ED treatment  The patient appears reasonably screened and/or stabilized for discharge and I doubt any other medical condition or other [facility] requiring further screening, evaluation, or treatment in the ED at this time prior to discharge.  Plan: Home Medications- usual; Home Treatments- rest; return here if the recommended treatment, does not improve the symptoms; Recommended follow up- Psych as scheduled. Return prn    Flint Melter, MD 06/28/14 [phone]

## 2014-06-28 NOTE — ED Notes (Signed)
Pt ambulating to restroom to obtain urine specimen

## 2014-06-28 NOTE — ED Notes (Addendum)
Pt c/o having anxiety attacks and being stressed. Pt seeking inpatient treatment. Pt denies SI/HI. Pt states she has not been sleeping for a couple of weeks, pt has tried different meds but nothing working. Psychiatrist sent pt over for eval. Pt having family issues at home.

## 2014-06-28 NOTE — ED Notes (Signed)
RN requested urine sample from pt. Pt unable to give one at this time, she urinated right before she got back to the room.

## 2014-06-28 NOTE — Discharge Instructions (Signed)
Insomnia Insomnia is frequent trouble falling and/or staying asleep. Insomnia can be a long term problem or a short term problem. Both are common. Insomnia can be a short term problem when the wakefulness is related to a certain stress or worry. Long term insomnia is often related to ongoing stress during waking hours and/or poor sleeping habits. Overtime, sleep deprivation itself can make the problem worse. Every little thing feels more severe because you are overtired and your ability to cope is decreased. CAUSES   Stress, anxiety, and depression.  Poor sleeping habits.  Distractions such as TV in the bedroom.  Naps close to bedtime.  Engaging in emotionally charged conversations before bed.  Technical reading before sleep.  Alcohol and other sedatives. They may make the problem worse. They can hurt normal sleep patterns and normal dream activity.  Stimulants such as caffeine for several hours prior to bedtime.  Pain syndromes and shortness of breath can cause insomnia.  Exercise late at night.  Changing time zones may cause sleeping problems (jet lag). It is sometimes helpful to have someone observe your sleeping patterns. They should look for periods of not breathing during the night (sleep apnea). They should also look to see how long those periods last. If you live alone or observers are uncertain, you can also be observed at a sleep clinic where your sleep patterns will be professionally monitored. Sleep apnea requires a checkup and treatment. Give your caregivers your medical history. Give your caregivers observations your family has made about your sleep.  SYMPTOMS   Not feeling rested in the morning.  Anxiety and restlessness at bedtime.  Difficulty falling and staying asleep. TREATMENT   Your caregiver may prescribe treatment for an underlying medical disorders. Your caregiver can give advice or help if you are using alcohol or other drugs for self-medication. Treatment  of underlying problems will usually eliminate insomnia problems.  Medications can be prescribed for short time use. They are generally not recommended for lengthy use.  Over-the-counter sleep medicines are not recommended for lengthy use. They can be habit forming.  You can promote easier sleeping by making lifestyle changes such as:  Using relaxation techniques that help with breathing and reduce muscle tension.  Exercising earlier in the day.  Changing your diet and the time of your last meal. No night time snacks.  Establish a regular time to go to bed.  Counseling can help with stressful problems and worry.  Soothing music and white noise may be helpful if there are background noises you cannot remove.  Stop tedious detailed work at least one hour before bedtime. HOME CARE INSTRUCTIONS   Keep a diary. Inform your caregiver about your progress. This includes any medication side effects. See your caregiver regularly. Take note of:  Times when you are asleep.  Times when you are awake during the night.  The quality of your sleep.  How you feel the next day. This information will help your caregiver care for you.  Get out of bed if you are still awake after 15 minutes. Read or do some quiet activity. Keep the lights down. Wait until you feel sleepy and go back to bed.  Keep regular sleeping and waking hours. Avoid naps.  Exercise regularly.  Avoid distractions at bedtime. Distractions include watching television or engaging in any intense or detailed activity like attempting to balance the household checkbook.  Develop a bedtime ritual. Keep a familiar routine of bathing, brushing your teeth, climbing into bed at the same   time each night, listening to soothing music. Routines increase the success of falling to sleep faster.  Use relaxation techniques. This can be using breathing and muscle tension release routines. It can also include visualizing peaceful scenes. You can  also help control troubling or intruding thoughts by keeping your mind occupied with boring or repetitive thoughts like the old concept of counting sheep. You can make it more creative like imagining planting one beautiful flower after another in your backyard garden.  During your day, work to eliminate stress. When this is not possible use some of the previous suggestions to help reduce the anxiety that accompanies stressful situations. MAKE SURE YOU:   Understand these instructions.  Will watch your condition.  Will get help right away if you are not doing well or get worse. Document Released: 10/02/2000 Document Revised: 12/28/2011 Document Reviewed: 11/02/2007 Exit[employer] Patient Information 2015 [employer]. This information is not intended to replace advice given to you by your health care provider. Make sure you discuss any questions you have with your health care provider.  

## 2014-07-07 ENCOUNTER — Encounter (HOSPITAL_COMMUNITY): Payer: Self-pay | Admitting: Emergency Medicine

## 2014-07-07 ENCOUNTER — Emergency Department (HOSPITAL_COMMUNITY)
Admission: EM | Admit: 2014-07-07 | Discharge: 2014-07-07 | Disposition: A | Discharge status: Home / Self Care | Payer: BC Managed Care – PPO | Attending: Emergency Medicine | Admitting: Emergency Medicine

## 2014-07-07 DIAGNOSIS — F411 Generalized anxiety disorder: Secondary | ICD-10-CM | POA: Insufficient documentation

## 2014-07-07 DIAGNOSIS — G4700 Insomnia, unspecified: Secondary | ICD-10-CM | POA: Diagnosis not present

## 2014-07-07 DIAGNOSIS — F3289 Other specified depressive episodes: Secondary | ICD-10-CM | POA: Insufficient documentation

## 2014-07-07 DIAGNOSIS — Z792 Long term (current) use of antibiotics: Secondary | ICD-10-CM | POA: Diagnosis not present

## 2014-07-07 DIAGNOSIS — Z87448 Personal history of other diseases of urinary system: Secondary | ICD-10-CM | POA: Insufficient documentation

## 2014-07-07 DIAGNOSIS — Z79899 Other long term (current) drug therapy: Secondary | ICD-10-CM | POA: Diagnosis not present

## 2014-07-07 DIAGNOSIS — R45851 Suicidal ideations: Secondary | ICD-10-CM | POA: Diagnosis not present

## 2014-07-07 DIAGNOSIS — M810 Age-related osteoporosis without current pathological fracture: Secondary | ICD-10-CM | POA: Insufficient documentation

## 2014-07-07 DIAGNOSIS — F329 Major depressive disorder, single episode, unspecified: Secondary | ICD-10-CM | POA: Insufficient documentation

## 2014-07-07 DIAGNOSIS — Z008 Encounter for other general examination: Secondary | ICD-10-CM | POA: Insufficient documentation

## 2014-07-07 DIAGNOSIS — E039 Hypothyroidism, unspecified: Secondary | ICD-10-CM | POA: Insufficient documentation

## 2014-07-07 DIAGNOSIS — M129 Arthropathy, unspecified: Secondary | ICD-10-CM | POA: Insufficient documentation

## 2014-07-07 LAB — COMPREHENSIVE METABOLIC PANEL
ALT: 18 U/L (ref 0–35)
AST: 23 U/L (ref 0–37)
Albumin: 4.3 g/dL (ref 3.5–5.2)
Alkaline Phosphatase: 51 U/L (ref 39–117)
Anion gap: 14 (ref 5–15)
BUN: 11 mg/dL (ref 6–23)
CO2: 25 mEq/L (ref 19–32)
Calcium: 9.5 mg/dL (ref 8.4–10.5)
Chloride: 100 mEq/L (ref 96–112)
Creatinine, Ser: 0.73 mg/dL (ref 0.50–1.10)
GFR calc Af Amer: >90 mL/min (ref >90)
GFR calc non Af Amer: >90 mL/min (ref >90)
Glucose, Bld: 150 mg/dL — ABNORMAL HIGH (ref 70–99)
Potassium: 4.1 mEq/L (ref 3.7–5.3)
Sodium: 139 mEq/L (ref 137–147)
Total Bilirubin: 0.4 mg/dL (ref 0.3–1.2)
Total Protein: 7.2 g/dL (ref 6.0–8.3)

## 2014-07-07 LAB — SALICYLATE LEVEL: Salicylate Lvl: <2 mg/dL — ABNORMAL LOW (ref 2.8–20.0)

## 2014-07-07 LAB — ACETAMINOPHEN LEVEL: Acetaminophen (Tylenol), Serum: <15 ug/mL (ref 10–30)

## 2014-07-07 LAB — CBC
HCT: 41.5 % (ref 36.0–46.0)
Hemoglobin: 13.7 g/dL (ref 12.0–15.0)
MCH: 30.8 pg (ref 26.0–34.0)
MCHC: 33 g/dL (ref 30.0–36.0)
MCV: 93.3 fL (ref 78.0–100.0)
Platelets: 218 10*3/uL (ref 150–400)
RBC: 4.45 MIL/uL (ref 3.87–5.11)
RDW: 13 % (ref 11.5–15.5)
WBC: 5.3 10*3/uL (ref 4.0–10.5)

## 2014-07-07 LAB — RAPID URINE DRUG SCREEN, HOSP PERFORMED
Amphetamines: NOT DETECTED
Barbiturates: NOT DETECTED
Benzodiazepines: NOT DETECTED
Cocaine: NOT DETECTED
Opiates: NOT DETECTED
Tetrahydrocannabinol: NOT DETECTED

## 2014-07-07 LAB — ETHANOL: Alcohol, Ethyl (B): <11 mg/dL (ref 0–11)

## 2014-07-07 MED ORDER — MOMETASONE FUROATE 50 MCG/ACT NA SUSP: 2.0000 | Freq: Every day | NASAL | Status: DC

## 2014-07-07 MED ORDER — ACETAMINOPHEN 500 MG PO TABS: 750.0000 mg | ORAL_TABLET | Freq: Four times a day (QID) | ORAL | Status: DC | PRN

## 2014-07-07 MED ORDER — LORAZEPAM 1 MG PO TABS: 1.0000 mg | ORAL_TABLET | Freq: Three times a day (TID) | ORAL | Status: DC | PRN

## 2014-07-07 MED ORDER — HYDROXYZINE HCL 25 MG PO TABS: 25.0000 mg | ORAL_TABLET | Freq: Four times a day (QID) | ORAL | Status: DC | PRN

## 2014-07-07 MED ORDER — TRAZODONE HCL 50 MG PO TABS: 50.0000 mg | ORAL_TABLET | Freq: Every day | ORAL | Status: DC

## 2014-07-07 MED ORDER — ALUM & MAG HYDROXIDE-SIMETH 200-200-20 MG/5ML PO SUSP: 30.0000 mL | ORAL | Status: DC | PRN

## 2014-07-07 MED ORDER — VILAZODONE HCL 20 MG PO TABS: 40.0000 mg | ORAL_TABLET | Freq: Every day | ORAL | Status: DC

## 2014-07-07 MED ORDER — CALCIUM CITRATE-VITAMIN D 315-200 MG-UNIT PO TABS: 1.0000 | ORAL_TABLET | Freq: Two times a day (BID) | ORAL | Status: DC

## 2014-07-07 MED ORDER — MUPIROCIN CALCIUM 2 % EX CREA
1.0000 "application " | TOPICAL_CREAM | Freq: Three times a day (TID) | CUTANEOUS | Status: DC
  Filled 2014-07-07: qty 15

## 2014-07-07 MED ORDER — MUPIROCIN CALCIUM 2 % EX CREA: 1.0000 "application " | TOPICAL_CREAM | Freq: Three times a day (TID) | CUTANEOUS | Status: DC

## 2014-07-07 MED ORDER — IBUPROFEN 200 MG PO TABS
600.0000 mg | ORAL_TABLET | Freq: Three times a day (TID) | ORAL | Status: DC | PRN
Start: 2014-07-07 — End: 2014-07-07

## 2014-07-07 MED ORDER — VALACYCLOVIR HCL 500 MG PO TABS: 500.0000 mg | ORAL_TABLET | Freq: Every day | ORAL | Status: DC

## 2014-07-07 MED ORDER — VITAMIN D-3 125 MCG (5000 UT) PO TABS: 5000.0000 [IU] | ORAL_TABLET | Freq: Every day | ORAL | Status: DC

## 2014-07-07 MED ORDER — LIOTHYRONINE SODIUM 5 MCG PO TABS: 5.0000 ug | ORAL_TABLET | Freq: Every day | ORAL | Status: DC

## 2014-07-07 MED ORDER — LEVOTHYROXINE SODIUM 112 MCG PO TABS: 112.0000 ug | ORAL_TABLET | Freq: Every day | ORAL | Status: DC

## 2014-07-07 MED ORDER — VITAMIN D3 25 MCG (1000 UNIT) PO TABS: 5000.0000 [IU] | ORAL_TABLET | Freq: Every day | ORAL | Status: DC

## 2014-07-07 MED ORDER — ZOLPIDEM TARTRATE 5 MG PO TABS: 5.0000 mg | ORAL_TABLET | Freq: Every evening | ORAL | Status: DC | PRN

## 2014-07-07 MED ORDER — MIRTAZAPINE 30 MG PO TABS: 15.0000 mg | ORAL_TABLET | Freq: Every day | ORAL | Status: DC

## 2014-07-07 MED ORDER — LEVOTHYROXINE SODIUM 112 MCG PO TABS
112.0000 ug | ORAL_TABLET | Freq: Every day | ORAL | Status: DC
  Filled 2014-07-07: qty 1

## 2014-07-07 MED ORDER — ZOLPIDEM TARTRATE 5 MG PO TABS: 2.5000 mg | ORAL_TABLET | Freq: Every day | ORAL | Status: DC

## 2014-07-07 MED ORDER — MELATONIN 5 MG PO TABS: 5.0000 mg | ORAL_TABLET | Freq: Every day | ORAL | Status: DC

## 2014-07-07 MED ORDER — ONDANSETRON HCL 4 MG PO TABS: 4.0000 mg | ORAL_TABLET | Freq: Three times a day (TID) | ORAL | Status: DC | PRN

## 2014-07-07 MED ORDER — CALCIUM CARBONATE-VITAMIN D 500-200 MG-UNIT PO TABS
1.0000 | ORAL_TABLET | Freq: Two times a day (BID) | ORAL | Status: DC
  Filled 2014-07-07: qty 1

## 2014-07-07 MED ORDER — FLUTICASONE PROPIONATE 50 MCG/ACT NA SUSP
2.0000 | Freq: Every day | NASAL | Status: DC
  Administered 2014-07-07: 2 via NASAL
  Filled 2014-07-07: qty 16

## 2014-07-07 MED ORDER — NICOTINE 21 MG/24HR TD PT24: 21.0000 mg | MEDICATED_PATCH | Freq: Every day | TRANSDERMAL | Status: DC

## 2014-07-07 MED ORDER — VALACYCLOVIR HCL 1 G PO TABS: 500.0000 mg | ORAL_TABLET | Freq: Every day | ORAL | Status: DC

## 2014-07-07 NOTE — Progress Notes (Signed)
PHARMACIST - PHYSICIAN ORDER COMMUNICATION  CONCERNING: P&T Medication Policy on Herbal Medications  DESCRIPTION:  This patient's order for:  Melatonin  has been noted.  This product(s) is classified as an "herbal" or natural product. Due to a lack of definitive safety studies or FDA approval, nonstandard manufacturing practices, plus the potential risk of unknown drug-drug interactions while on inpatient medications, the Pharmacy and Therapeutics Committee does not permit the use of "herbal" or natural products of this type within [place].   ACTION TAKEN: The pharmacy department is unable to verify this order at this time and your patient has been informed of this safety policy. Please reevaluate patient's clinical condition at discharge and address if the herbal or natural product(s) should be resumed at that time.  Thanks, Aemilia Dedrick Ann 07/07/2014  

## 2014-07-07 NOTE — ED Notes (Signed)
Pt belongings sent home with family. Pt has glasses.

## 2014-07-07 NOTE — Discharge Instructions (Signed)
Insomnia Insomnia is frequent trouble falling and/or staying asleep. Insomnia can be a long term problem or a short term problem. Both are common. Insomnia can be a short term problem when the wakefulness is related to a certain stress or worry. Long term insomnia is often related to ongoing stress during waking hours and/or poor sleeping habits. Overtime, sleep deprivation itself can make the problem worse. Every little thing feels more severe because you are overtired and your ability to cope is decreased. CAUSES   Stress, anxiety, and depression.  Poor sleeping habits.  Distractions such as TV in the bedroom.  Naps close to bedtime.  Engaging in emotionally charged conversations before bed.  Technical reading before sleep.  Alcohol and other sedatives. They may make the problem worse. They can hurt normal sleep patterns and normal dream activity.  Stimulants such as caffeine for several hours prior to bedtime.  Pain syndromes and shortness of breath can cause insomnia.  Exercise late at night.  Changing time zones may cause sleeping problems (jet lag). It is sometimes helpful to have someone observe your sleeping patterns. They should look for periods of not breathing during the night (sleep apnea). They should also look to see how long those periods last. If you live alone or observers are uncertain, you can also be observed at a sleep clinic where your sleep patterns will be professionally monitored. Sleep apnea requires a checkup and treatment. Give your caregivers your medical history. Give your caregivers observations your family has made about your sleep.  SYMPTOMS   Not feeling rested in the morning.  Anxiety and restlessness at bedtime.  Difficulty falling and staying asleep. TREATMENT   Your caregiver may prescribe treatment for an underlying medical disorders. Your caregiver can give advice or help if you are using alcohol or other drugs for self-medication. Treatment  of underlying problems will usually eliminate insomnia problems.  Medications can be prescribed for short time use. They are generally not recommended for lengthy use.  Over-the-counter sleep medicines are not recommended for lengthy use. They can be habit forming.  You can promote easier sleeping by making lifestyle changes such as:  Using relaxation techniques that help with breathing and reduce muscle tension.  Exercising earlier in the day.  Changing your diet and the time of your last meal. No night time snacks.  Establish a regular time to go to bed.  Counseling can help with stressful problems and worry.  Soothing music and white noise may be helpful if there are background noises you cannot remove.  Stop tedious detailed work at least one hour before bedtime. HOME CARE INSTRUCTIONS   Keep a diary. Inform your caregiver about your progress. This includes any medication side effects. See your caregiver regularly. Take note of:  Times when you are asleep.  Times when you are awake during the night.  The quality of your sleep.  How you feel the next day. This information will help your caregiver care for you.  Get out of bed if you are still awake after 15 minutes. Read or do some quiet activity. Keep the lights down. Wait until you feel sleepy and go back to bed.  Keep regular sleeping and waking hours. Avoid naps.  Exercise regularly.  Avoid distractions at bedtime. Distractions include watching television or engaging in any intense or detailed activity like attempting to balance the household checkbook.  Develop a bedtime ritual. Keep a familiar routine of bathing, brushing your teeth, climbing into bed at the same   time each night, listening to soothing music. Routines increase the success of falling to sleep faster.  Use relaxation techniques. This can be using breathing and muscle tension release routines. It can also include visualizing peaceful scenes. You can  also help control troubling or intruding thoughts by keeping your mind occupied with boring or repetitive thoughts like the old concept of counting sheep. You can make it more creative like imagining planting one beautiful flower after another in your backyard garden.  During your day, work to eliminate stress. When this is not possible use some of the previous suggestions to help reduce the anxiety that accompanies stressful situations. MAKE SURE YOU:   Understand these instructions.  Will watch your condition.  Will get help right away if you are not doing well or get worse. Document Released: 10/02/2000 Document Revised: 12/28/2011 Document Reviewed: 11/02/2007 Exit[employer] Patient Information 2015 [employer]. This information is not intended to replace advice given to you by your health care provider. Make sure you discuss any questions you have with your health care provider.  

## 2014-07-07 NOTE — [facility] Suicide Risk Assessment (Signed)
Suicide Risk Assessment  Discharge Assessment     Demographic Factors:  Caucasian and Unemployed  Total Time spent with patient: 20 minutes   PsPsychiatric Specialty Exam:     Blood pressure 115/76, pulse 98, temperature 98.5 F (36.9 C), temperature source Oral, resp. rate 18, last menstrual period 04/22/2011, SpO2 99.00%.There is no weight on file to calculate BMI.  General Appearance: Casual  Eye Contact::  Good  Speech:  Normal Rate  Volume:  Normal  Mood:  Anxious, depressed  Affect:  Congruent  Thought Process:  Coherent  Orientation:  Full (Time, Place, and Person)  Thought Content:  Rumination  Suicidal Thoughts:  No  Homicidal Thoughts:  No  Memory:  Immediate;   Good Recent;   Good Remote;   Good  Judgement:  Good  Insight:  Good  Psychomotor Activity:  Normal  Concentration:  Good  Recall:  Good  Fund of Knowledge:Good  Language: Good  Akathisia:  No  Handed:  Right  AIMS (if indicated):     Assets:  Communication Skills Desire for Improvement Financial Resources/Insurance Housing Leisure Time Physical Health Resilience Social Support Talents/Skills Transportation Vocational/Educational  Sleep:      Musculoskeletal: Strength & Muscle Tone: within normal limits Gait & Station: normal Patient leans: N/A  Mental Status Per Nursing Assessment::   On Admission:   insomnia, anxiety  Current Mental Status by Physician: NA  Loss Factors: NA  Historical Factors: NA  Risk Reduction Factors:   Responsible for children under 86 years of age, Sense of responsibility to family, Living with another person, especially a relative, Positive social support and Positive coping skills or problem solving skills  Continued Clinical Symptoms:  Anxiety   Cognitive Features That Contribute To Risk:  None  Suicide Risk:  Minimal: No identifiable suicidal ideation.  Patients presenting with no risk factors but with morbid ruminations; may be classified as  minimal risk based on the severity of the depressive symptoms  Discharge Diagnoses:   AXIS I:  Generalized Anxiety Disorder AXIS II:  Deferred AXIS III:   Past Medical History  Diagnosis Date  . Allergy   . Anxiety   . Depression   . Arthritis   . Thyroid disease     hypothyroidism  . Endometriosis   . Osteoporosis    AXIS IV:  other psychosocial or environmental problems, problems related to social environment and problems with primary support group AXIS V:  61-70 mild symptoms  Plan Of Care/Follow-up recommendations:  Activity:  as tolerated Diet:  low-sodium heart healthy diet  Is patient on multiple antipsychotic therapies at discharge:  No   Has Patient had three or more failed trials of antipsychotic monotherapy by history:  No  Recommended Plan for Multiple Antipsychotic Therapies: NA    Adaleigh Warf, PMH-NP 07/07/2014, 3:11 PM

## 2014-07-07 NOTE — ED Notes (Signed)
Pt reports struggling with depression for past several years. Pt reports this am she started having thoughts of killing herself by taking a bottle of ambien, denies having SI before. Has been struggling with insomnia, and psychiatrist has been adjusting medication for that issue. Pt states she has been having personal issues for the past few weeks that has made things worse.

## 2014-07-07 NOTE — Consult Note (Signed)
[place] [facility] Consult   Reason for Consult:  Insomnia  Referring Physician:  EDP  AMERAH PULEO is an 53 y.o. female. Total Time spent with patient: 20 minutes  Assessment: AXIS I:  Depressive Disorder NOS and Generalized Anxiety Disorder AXIS II:  Deferred AXIS III:   Past Medical History  Diagnosis Date  . Allergy   . Anxiety   . Depression   . Arthritis   . Thyroid disease     hypothyroidism  . Endometriosis   . Osteoporosis    AXIS IV:  other psychosocial or environmental problems, problems related to social environment and problems with primary support group AXIS V:  61-70 mild symptoms  Plan:  Supportive therapy provided about ongoing stressors.Refer to IOP, Dr. Dwyane Dee consulted and she will need to go through Dr. Toy Care first.  Subjective:   LAURALIE BLACKSHER is a 53 y.o. female patient does not warrant admission  HPI:  The patient has been having issues sleeping due to multiple stressors:  Issues with her ex-husband, broken foot a month ago, children concerns.  She stated she had fleeting thought of suicide when she was not able to sleep which scared her and she came to the ED, a supportive friend is at her bedside.  Denies current suicidal/homicidal ideations, hallucinations, and alcohol/drug use.  She was coping by exercising until she broke her foot.  Her anxiety increased with decrease in sleep.  Mindfulness and sleep were discussed along with her current stressors at length.  Wrenna does not feel she needs inpatient hospitalization but is agreeable to go to [facility] IOP, Dr. Dwyane Dee called and stated she could come to IOP after concurring with her psychiatrist, Dr. Toy Care.  Patient also agreeable to take Vistaril 25 mg for sleep with repeat x 1 in one hour if not effective.  Also, Vistaril 25 mg every 6 hours PRN anxiety.  Trazodone Rx given as a back-up to get her through the weekend if the Vistaril is not effective for sleep.  She asked her friend to take her  medications except for the few she will take prior to bed, Vistaril, for safety.  Family and friends are supportive, she has a 6 and 79 yo who is staying with her ex-husband for the week-end.  Family and friends will be available, safe to return home and follow-up with IOP and her regular psychiatrist. HPI Elements:   Location:  generalized. Quality:  acute. Severity:  mild to moderate. Timing:  constant. Duration:  month. Context:  stressors.  Past Psychiatric History: Past Medical History  Diagnosis Date  . Allergy   . Anxiety   . Depression   . Arthritis   . Thyroid disease     hypothyroidism  . Endometriosis   . Osteoporosis     reports that she has never smoked. She does not have any smokeless tobacco history on file. She reports that she drinks about 2.4 ounces of alcohol per week. She reports that she does not use illicit drugs. History reviewed. No pertinent family history.         Allergies:   Allergies  Allergen Reactions  . Clarithromycin Nausea And Vomiting  . Codeine Other (See Comments)    headache  . Erythromycin Nausea And Vomiting  . Trazodone And Nefazodone     Single-dose of Trazodone caused severe nasal congestion.    ACT Assessment Complete:  Yes:    Educational Status    Risk to Self: Risk to self with the past 6  months Is patient at risk for suicide?: Yes Substance abuse history and/or treatment for substance abuse?: No  Risk to Others:    Abuse:    Prior Inpatient Therapy:    Prior Outpatient Therapy:    Additional Information:                    Objective: Blood pressure 115/76, pulse 98, temperature 98.5 F (36.9 C), temperature source Oral, resp. rate 18, last menstrual period 04/22/2011, SpO2 99.00%.There is no weight on file to calculate BMI. Results for orders placed during the hospital encounter of 07/07/14 (from the past 72 hour(s))  ACETAMINOPHEN LEVEL     Status: None   Collection Time    07/07/14 12:34 PM       Result Value Ref Range   Acetaminophen (Tylenol), Serum <15.0  10 - 30 ug/mL   Comment:            THERAPEUTIC CONCENTRATIONS VARY     SIGNIFICANTLY. A RANGE OF 10-30     ug/mL MAY BE AN EFFECTIVE     CONCENTRATION FOR MANY PATIENTS.     HOWEVER, SOME ARE BEST TREATED     AT CONCENTRATIONS OUTSIDE THIS     RANGE.     ACETAMINOPHEN CONCENTRATIONS     >150 ug/mL AT 4 HOURS AFTER     INGESTION AND >50 ug/mL AT 12     HOURS AFTER INGESTION ARE     OFTEN ASSOCIATED WITH TOXIC     REACTIONS.  CBC     Status: None   Collection Time    07/07/14 12:34 PM      Result Value Ref Range   WBC 5.3  4.0 - 10.5 K/uL   RBC 4.45  3.87 - 5.11 MIL/uL   Hemoglobin 13.7  12.0 - 15.0 g/dL   HCT 41.5  36.0 - 46.0 %   MCV 93.3  78.0 - 100.0 fL   MCH 30.8  26.0 - 34.0 pg   MCHC 33.0  30.0 - 36.0 g/dL   RDW 13.0  11.5 - 15.5 %   Platelets 218  150 - 400 K/uL  COMPREHENSIVE METABOLIC PANEL     Status: Abnormal   Collection Time    07/07/14 12:34 PM      Result Value Ref Range   Sodium 139  137 - 147 mEq/L   Potassium 4.1  3.7 - 5.3 mEq/L   Chloride 100  96 - 112 mEq/L   CO2 25  19 - 32 mEq/L   Glucose, Bld 150 (*) 70 - 99 mg/dL   BUN 11  6 - 23 mg/dL   Creatinine, Ser 0.73  0.50 - 1.10 mg/dL   Calcium 9.5  8.4 - 10.5 mg/dL   Total Protein 7.2  6.0 - 8.3 g/dL   Albumin 4.3  3.5 - 5.2 g/dL   AST 23  0 - 37 U/L   ALT 18  0 - 35 U/L   Alkaline Phosphatase 51  39 - 117 U/L   Total Bilirubin 0.4  0.3 - 1.2 mg/dL   GFR calc non Af Amer >90  >90 mL/min   GFR calc Af Amer >90  >90 mL/min   Comment: (NOTE)     The eGFR has been calculated using the CKD EPI equation.     This calculation has not been validated in all clinical situations.     eGFR's persistently <90 mL/min signify possible Chronic Kidney     Disease.   Anion  gap 14  5 - 15  ETHANOL     Status: None   Collection Time    07/07/14 12:34 PM      Result Value Ref Range   Alcohol, Ethyl (B) <11  0 - 11 mg/dL   Comment:             LOWEST DETECTABLE LIMIT FOR     SERUM ALCOHOL IS 11 mg/dL     FOR MEDICAL PURPOSES ONLY  SALICYLATE LEVEL     Status: Abnormal   Collection Time    07/07/14 12:34 PM      Result Value Ref Range   Salicylate Lvl <8.2 (*) 2.8 - 20.0 mg/dL  URINE RAPID DRUG SCREEN (HOSP PERFORMED)     Status: None   Collection Time    07/07/14 12:45 PM      Result Value Ref Range   Opiates NONE DETECTED  NONE DETECTED   Cocaine NONE DETECTED  NONE DETECTED   Benzodiazepines NONE DETECTED  NONE DETECTED   Amphetamines NONE DETECTED  NONE DETECTED   Tetrahydrocannabinol NONE DETECTED  NONE DETECTED   Barbiturates NONE DETECTED  NONE DETECTED   Comment:            DRUG SCREEN FOR MEDICAL PURPOSES     ONLY.  IF CONFIRMATION IS NEEDED     FOR ANY PURPOSE, NOTIFY LAB     WITHIN 5 DAYS.                LOWEST DETECTABLE LIMITS     FOR URINE DRUG SCREEN     Drug Class       Cutoff (ng/mL)     Amphetamine      1000     Barbiturate      200     Benzodiazepine   956     Tricyclics       213     Opiates          300     Cocaine          300     THC              50   Labs are reviewed and are pertinent for no medical issues noted.  Current Facility-Administered Medications  Medication Dose Route Frequency Provider Last Rate Last Dose  . 0.9 %  sodium chloride infusion  500 mL Intravenous Continuous Gatha Mayer, MD      . acetaminophen (TYLENOL) tablet 750 mg  750 mg Oral Q6H PRN Nicole Pisciotta, PA-C      . alum & mag hydroxide-simeth (MAALOX/MYLANTA) 200-200-20 MG/5ML suspension 30 mL  30 mL Oral PRN Nicole Pisciotta, PA-C      . calcium-vitamin D (OSCAL WITH D) 500-200 MG-UNIT per tablet 1 tablet  1 tablet Oral BID Monico Blitz, PA-C      . [START ON 07/08/2014] cholecalciferol (VITAMIN D) tablet 5,000 Units  5,000 Units Oral Daily Nicole Pisciotta, PA-C      . fluticasone (FLONASE) 50 MCG/ACT nasal spray 2 spray  2 spray Each Nare Daily Nicole Pisciotta, PA-C      . ibuprofen (ADVIL,MOTRIN)  tablet 600 mg  600 mg Oral Q8H PRN Nicole Pisciotta, PA-C      . [START ON 07/08/2014] levothyroxine (SYNTHROID, LEVOTHROID) tablet 112 mcg  112 mcg Oral QAC breakfast Nicole Pisciotta, PA-C      . [START ON 07/08/2014] liothyronine (CYTOMEL) tablet 5 mcg  5 mcg Oral Daily Nicole Pisciotta, PA-C      .  LORazepam (ATIVAN) tablet 1 mg  1 mg Oral Q8H PRN Nicole Pisciotta, PA-C      . mirtazapine (REMERON) tablet 15 mg  15 mg Oral QHS Nicole Pisciotta, PA-C      . mupirocin cream (BACTROBAN) 2 % 1 application  1 application Topical TID Nicole Pisciotta, PA-C      . nicotine (NICODERM CQ - dosed in mg/24 hours) patch 21 mg  21 mg Transdermal Daily Nicole Pisciotta, PA-C      . ondansetron (ZOFRAN) tablet 4 mg  4 mg Oral Q8H PRN Nicole Pisciotta, PA-C      . [START ON 07/08/2014] valACYclovir (VALTREX) tablet 500 mg  500 mg Oral Daily Nicole Pisciotta, PA-C      . [START ON 07/08/2014] Vilazodone HCl TABS 40 mg  40 mg Oral Daily Nicole Pisciotta, PA-C      . zolpidem (AMBIEN) tablet 2.5-5 mg  2.5-5 mg Oral QHS Nicole Pisciotta, PA-C      . zolpidem (AMBIEN) tablet 5 mg  5 mg Oral QHS PRN Monico Blitz, PA-C       Current Outpatient Prescriptions  Medication Sig Dispense Refill  . acetaminophen (TYLENOL) 500 MG tablet Take 750 mg by mouth every 6 (six) hours as needed for moderate pain.      . calcium citrate-vitamin D (CITRACAL+D) 315-200 MG-UNIT per tablet Take 1 tablet by mouth 2 (two) times daily.      . Cholecalciferol (VITAMIN D-3) 5000 UNITS TABS Take 5,000 Units by mouth daily.      Marland Kitchen gabapentin (NEURONTIN) 600 MG tablet Take 600 mg by mouth 4 (four) times daily.      Marland Kitchen levothyroxine (SYNTHROID, LEVOTHROID) 112 MCG tablet Take 112 mcg by mouth daily before breakfast.      . liothyronine (CYTOMEL) 5 MCG tablet Take 5 mcg by mouth daily.        . Melatonin 5 MG TABS Take 5 mg by mouth at bedtime.      . mirtazapine (REMERON) 15 MG tablet Take 15 mg by mouth at bedtime.      . mometasone  (NASONEX) 50 MCG/ACT nasal spray Place 2 sprays into the nose daily.      . mupirocin cream (BACTROBAN) 2 % Apply 1 application topically 3 (three) times daily.      . valACYclovir (VALTREX) 1000 MG tablet Take 500 mg by mouth daily.       . Vilazodone HCl 20 MG TABS Take 40 mg by mouth daily.       Marland Kitchen zolpidem (AMBIEN) 10 MG tablet Take 2.5-5 mg by mouth at bedtime. Takes a quarter to a half a tab depending on how she feels.      . ALPRAZolam (XANAX) 0.25 MG tablet Take 0.25 mg by mouth 3 (three) times daily as needed for anxiety.        Psychiatric Specialty Exam:     Blood pressure 115/76, pulse 98, temperature 98.5 F (36.9 C), temperature source Oral, resp. rate 18, last menstrual period 04/22/2011, SpO2 99.00%.There is no weight on file to calculate BMI.  General Appearance: Casual  Eye Contact::  Good  Speech:  Normal Rate  Volume:  Normal  Mood:  Anxious, depressed  Affect:  Congruent  Thought Process:  Coherent  Orientation:  Full (Time, Place, and Person)  Thought Content:  Rumination  Suicidal Thoughts:  No  Homicidal Thoughts:  No  Memory:  Immediate;   Good Recent;   Good Remote;   Good  Judgement:  Good  Insight:  Good  Psychomotor Activity:  Normal  Concentration:  Good  Recall:  Good  Fund of Knowledge:Good  Language: Good  Akathisia:  No  Handed:  Right  AIMS (if indicated):     Assets:  Communication Skills Desire for Improvement Financial Resources/Insurance Housing Leisure Time [place] Talents/Skills Transportation Vocational/Educational  Sleep:      Musculoskeletal: Strength & Muscle Tone: within normal limits Gait & Station: normal Patient leans: N/A  Treatment Plan Summary: Vistaril 25 mg every 6 hours PRN anxiety, Trazodone 50 mg for sleep PRN  Waylan Boga, PMH-NP 07/07/2014 3:07 PM

## 2014-07-07 NOTE — ED Provider Notes (Signed)
CSN: [id number]     Arrival date & time 07/07/14  1154 History  This chart was scribed for non-physician practitioner, Wynetta Emery, PA-C,working with Nelia Shi, MD, by Karle Plumber, ED Scribe. This patient was seen in room WTR3/WLPT3 and the patient's care was started at 1:01 PM.  Chief Complaint  Patient presents with  . Suicidal   The history is provided by the patient. No language interpreter was used.   HPI Comments:  Carla [name] is a 53 y.o. female with PMH of depression and anxiety who presents to the Emergency Department complaining of increased depression and suicidal ideations that began approximately one month ago. She reports associated insomnia for the past month. She states she has been seeing Dr. Sharl Ma to find an effective treatment for the insomnia. She has been taking Ambien with significant relief of the insomnia but states it is now not effective. She states she was started on Remeron yesterday and reports sleeping throughout the night last night. Her physician started her on Gabapentin and she reports taking it yesterday but states she absolutely did not like the way it made her feel, as if she "has cobb webs in her head". Reports suicidal ideations secondary to not being able to sleep. Taking a bottle of Ambien is her plan of suicide just so she can get restful sleep. Reporting that she has never been suicidal in the past and has never had these types of thoughts in the past. Pt reports some mild pain of her  foot secondary to a fracture of her third metatarsal a few weeks ago. She denies audio or visual hallucinations, alcohol or drug abuse or numbness, weakness or tingling of the  foot.  Past Medical History  Diagnosis Date  . Allergy   . Anxiety   . Depression   . Arthritis   . Thyroid disease     hypothyroidism  . Endometriosis   . Osteoporosis    Past Surgical History  Procedure Laterality Date  . Dilation and curettage of uterus    . Laparoscopy  abdomen diagnostic    . Lumbar disc surgery    . Left shoulder arthroscopy    . Cyst left 2nd finger    . Colonoscopy  04/28/11    Normal   History reviewed. No pertinent family history. History  Substance Use Topics  . Smoking status: Never Smoker   . Smokeless tobacco: Not on file  . Alcohol Use: 2.4 oz/week    4 Glasses of wine per week   OB History   Grav Para Term Preterm Abortions TAB SAB Ect Mult Living                 Review of Systems  Psychiatric/Behavioral: Positive for suicidal ideas.  All other systems reviewed and are negative.   Allergies  Clarithromycin; Codeine; Erythromycin; and Trazodone and nefazodone  Home Medications   Prior to Admission medications   Medication Sig Start Date End Date Taking? Authorizing Provider  acetaminophen (TYLENOL) 500 MG tablet Take 750 mg by mouth every 6 (six) hours as needed for moderate pain.    Historical Provider, MD  ALPRAZolam Prudy Feeler) 0.25 MG tablet Take 0.25 mg by mouth 3 (three) times daily as needed for anxiety.    Historical Provider, MD  azelastine (ASTELIN) 137 MCG/SPRAY nasal spray Place 1 spray into the nose 2 (two) times daily as needed for allergies. Use in each nostril as directed    Historical Provider, MD  calcium citrate-vitamin D (  CITRACAL+D) 315-200 MG-UNIT per tablet Take 1 tablet by mouth 2 (two) times daily.    Historical Provider, MD  Cholecalciferol (VITAMIN D-3) 5000 UNITS TABS Take 5,000 Units by mouth daily.    Historical Provider, MD  ibuprofen (ADVIL,MOTRIN) 200 MG tablet Take 200 mg by mouth every 6 (six) hours as needed.      Historical Provider, MD  levothyroxine (SYNTHROID, LEVOTHROID) 112 MCG tablet Take 112 mcg by mouth daily before breakfast.    Historical Provider, MD  liothyronine (CYTOMEL) 5 MCG tablet Take 5 mcg by mouth daily.      Historical Provider, MD  Melatonin 5 MG TABS Take 5 mg by mouth at bedtime.    Historical Provider, MD  mometasone (NASONEX) 50 MCG/ACT nasal spray Place 2  sprays into the nose daily.    Historical Provider, MD  mupirocin cream (BACTROBAN) 2 % Apply 1 application topically 3 (three) times daily.    Historical Provider, MD  sulfamethoxazole-trimethoprim (BACTRIM DS) 800-160 MG per tablet Take 1 tablet by mouth 2 (two) times daily.    Historical Provider, MD  valACYclovir (VALTREX) 1000 MG tablet Take 500 mg by mouth daily.  02/05/11   Historical Provider, MD  Vilazodone HCl 20 MG TABS Take 20 mg by mouth daily. Started dose pack two weeks ago and told to stay with  daily.    Historical Provider, MD  zolpidem (AMBIEN) 10 MG tablet Take 2.5-5 mg by mouth at bedtime. Takes a quarter to a half a tab depending on how she feels.    Historical Provider, MD   Triage Vitals: BP 115/76  Pulse 98  Temp(Src) 98.5 F (36.9 C) (Oral)  Resp 18  SpO2 99%  LMP 04/22/2011 Physical Exam  Nursing note and vitals reviewed. Constitutional: She is oriented to person, place, and time. She appears well-developed and well-nourished.  HENT:  Head: Normocephalic and atraumatic.  Mouth/Throat: Oropharynx is clear and moist.  Eyes: EOM are normal. Pupils are equal, round, and reactive to light.  Neck: Normal range of motion. Neck supple.  Cardiovascular: Normal rate, regular rhythm and intact distal pulses.   Pulmonary/Chest: Effort normal and breath sounds normal.  Abdominal: Soft.  Musculoskeletal: Normal range of motion. She exhibits no edema and no tenderness.  Neurological: She is alert and oriented to person, place, and time. No cranial nerve deficit.  Skin: Skin is warm and dry.  Psychiatric: Her speech is normal and behavior is normal. She exhibits a depressed mood. She expresses suicidal ideation. She expresses no homicidal ideation.    ED Course  Procedures (including critical care time) DIAGNOSTIC STUDIES: Oxygen Saturation is 99% on RA, normal by my interpretation.   COORDINATION OF CARE: 1:11 PM- Pt medically cleared for transfer to psych. Pt  verbalizes understanding and agreement to plan.  Medications - No data to display  Labs Review Labs Reviewed  COMPREHENSIVE METABOLIC PANEL - Abnormal; Notable for the following:    Glucose, Bld 150 (*)    All other components within normal limits  SALICYLATE LEVEL - Abnormal; Notable for the following:    Salicylate Lvl <2.0 (*)    All other components within normal limits  ACETAMINOPHEN LEVEL  CBC  ETHANOL  URINE RAPID DRUG SCREEN (HOSP PERFORMED)    Imaging Review No results found.   EKG Interpretation None      MDM   Final diagnoses:  Generalized anxiety disorder  Insomnia    Filed Vitals:   07/07/14 1212 07/07/14 1550  BP: 115/76 129/73  Pulse: 98 79  Temp: 98.5 F (36.9 C) 98.9 F (37.2 C)  TempSrc: Oral Oral  Resp: 18 18  SpO2: 99% 98%    Carla [name] is a 53 y.o. female presenting with vague suicidal ideation triggered by insomnia and difficulties in social interaction with her ex-husband.   Patient is medically cleared for psychiatric evaluation will be transferred to the psych ED. TTS consulted, home meds and psych standard holding orders placed.    Wynetta Emery, PA-C 07/07/14 1950

## 2014-07-08 NOTE — ED Provider Notes (Signed)
Medical screening examination/treatment/procedure(s) were performed by non-physician practitioner and as supervising physician I was immediately available for consultation/collaboration.    Rosealynn Mateus L Aldea Avis, MD 07/08/14 0717 

## 2014-07-16 NOTE — Consult Note (Signed)
Case discussed, agree with plan 

## 2014-10-30 ENCOUNTER — Other Ambulatory Visit: Payer: Self-pay | Admitting: Obstetrics and Gynecology

## 2014-10-30 DIAGNOSIS — N6310 Unspecified lump in the right breast, unspecified quadrant: Secondary | ICD-10-CM

## 2014-11-07 ENCOUNTER — Ambulatory Visit
Admission: RE | Admit: 2014-11-07 | Discharge: 2014-11-07 | Disposition: A | Discharge status: Home / Self Care | Payer: BLUE CROSS/BLUE SHIELD | Source: Ambulatory Visit | Attending: Obstetrics and Gynecology | Admitting: Obstetrics and Gynecology

## 2014-11-07 DIAGNOSIS — N6310 Unspecified lump in the right breast, unspecified quadrant: Secondary | ICD-10-CM

## 2015-03-07 ENCOUNTER — Other Ambulatory Visit: Payer: Self-pay | Admitting: Obstetrics and Gynecology

## 2015-03-13 NOTE — H&P (Signed)
NAMGeralynn Ochs:  Markham, Reece            ACCOUNT NO.:  [id number]  MEDICAL RECORD NO.:  [id number]  LOCATION:  PERIO                         FACILITY:  [facility]  PHYSICIAN:  Lenoard Adenichard J. Liandra Mendia, M.D.DATE OF BIRTH:  May 20, 1961  DATE OF ADMISSION:  03/06/2015 DATE OF DISCHARGE:                             HISTORY & PHYSICAL   CHIEF COMPLAINT:  Refractory __________ menopausal bleeding.  HISTORY OF PRESENT ILLNESS:  This is a 54 year old white female G3, P1, who presents with menopausal bleeding __________ followed by recurrent bleeding.  ALLERGIES:  She has allergies to Biaxin, __________.  SOCIAL HISTORY:  She is a nonsmoker, nondrinker.  She denies domestic or physical violence.  FAMILY HISTORY:  Breast cancer, bipolar disorder, hypertension, heart disease, thyroid disease, __________ osteoporosis.  MEDICATIONS:  Medication history includes Aygestin, thyroid medication, ibuprofen as needed, magnesium __________, Pristiq, Remeron, and Rexulti.  PAST MEDICAL HISTORY:  She has a history of vaginal delivery x1, miscarriage x2, history of resection of submucous fibroid __________.  PHYSICAL EXAMINATION:  GENERAL:  She is a well-developed, well- nourished, white female, in no acute distress. HEENT:  Normal. NECK:  Supple.  Full range of motion. LUNGS:  Clear. HEART:  Regular rate and rhythm. ABDOMEN:  Soft, nontender. PELVIC:  Normal sized uterus and no adnexal masses. EXTREMITIES:  There are no cords. NEUROLOGIC:  Nonfocal. SKIN:  Intact.  IMPRESSION:  Refractory postmenopausal bleeding __________.  PLAN:  Proceed with diagnostic hysteroscopy, D and C, NovaSure endometrial ablation.  Risks of anesthesia, infection, bleeding, injury to surrounding organs with possible need for repair was discussed, delayed versus immediate complications to include bowel and bladder injury noted. __________ the patient desires NovaSure prophylactically __________ postmenopausal bleeding __________  menorrhagia, however, due to her other medical issues and possibility __________, she elected to proceed with NovaSure __________.     Lenoard Adenichard J. Dereka Lueras, M.D.     RJT/MEDQ  D:  03/13/2015  T:  03/13/2015  Job:  [id number]  cc:   Lenoard Adenichard J. Mustapha Colson, M.D. Fax: [phone]

## 2015-03-13 NOTE — Anesthesia Preprocedure Evaluation (Addendum)
Anesthesia Evaluation  Patient identified by MRN, date of birth, ID band Patient awake    Reviewed: Allergy & Precautions, NPO status , Patient's Chart, lab work & pertinent test results, reviewed documented beta blocker date and time   Airway Mallampati: II   Neck ROM: Full    Dental  (+) Teeth Intact, Dental Advisory Given   Pulmonary neg pulmonary ROS,  breath sounds clear to auscultation        Cardiovascular negative cardio ROS  Rhythm:Regular     Neuro/Psych Anxiety Depression negative neurological ROS     GI/Hepatic Neg liver ROS,   Endo/Other  Hypothyroidism   Renal/GU negative Renal ROS     Musculoskeletal  (+) Arthritis -,   Abdominal (+)  Abdomen: soft.    Peds  Hematology   Anesthesia Other Findings   Reproductive/Obstetrics                            Anesthesia Physical Anesthesia Plan  ASA: II  Anesthesia Plan: General   Post-op Pain Management:    Induction: Intravenous  Airway Management Planned: LMA and Oral ETT  Additional Equipment:   Intra-op Plan:   Post-operative Plan: Extubation in OR  Informed Consent: I have reviewed the patients History and Physical, chart, labs and discussed the procedure including the risks, benefits and alternatives for the proposed anesthesia with the patient or authorized representative who has indicated his/her understanding and acceptance.     Plan Discussed with:   Anesthesia Plan Comments: (Check am labs)        Anesthesia Quick Evaluation

## 2015-03-14 ENCOUNTER — Ambulatory Visit (HOSPITAL_COMMUNITY): Payer: BLUE CROSS/BLUE SHIELD | Admitting: Anesthesiology

## 2015-03-14 ENCOUNTER — Ambulatory Visit (HOSPITAL_COMMUNITY)
Admission: RE | Admit: 2015-03-14 | Discharge: 2015-03-14 | Disposition: A | Discharge status: Home / Self Care | Payer: BLUE CROSS/BLUE SHIELD | Source: Ambulatory Visit | Attending: Obstetrics and Gynecology | Admitting: Obstetrics and Gynecology

## 2015-03-14 ENCOUNTER — Encounter (HOSPITAL_COMMUNITY)
Admission: RE | Disposition: A | Discharge status: Home / Self Care | Payer: Self-pay | Source: Ambulatory Visit | Attending: Obstetrics and Gynecology

## 2015-03-14 DIAGNOSIS — N858 Other specified noninflammatory disorders of uterus: Secondary | ICD-10-CM | POA: Insufficient documentation

## 2015-03-14 DIAGNOSIS — M1990 Unspecified osteoarthritis, unspecified site: Secondary | ICD-10-CM | POA: Diagnosis not present

## 2015-03-14 DIAGNOSIS — Z881 Allergy status to other antibiotic agents status: Secondary | ICD-10-CM | POA: Diagnosis not present

## 2015-03-14 DIAGNOSIS — N840 Polyp of corpus uteri: Secondary | ICD-10-CM | POA: Insufficient documentation

## 2015-03-14 DIAGNOSIS — Z885 Allergy status to narcotic agent status: Secondary | ICD-10-CM | POA: Diagnosis not present

## 2015-03-14 DIAGNOSIS — F419 Anxiety disorder, unspecified: Secondary | ICD-10-CM | POA: Insufficient documentation

## 2015-03-14 DIAGNOSIS — Z888 Allergy status to other drugs, medicaments and biological substances status: Secondary | ICD-10-CM | POA: Insufficient documentation

## 2015-03-14 DIAGNOSIS — N950 Postmenopausal bleeding: Secondary | ICD-10-CM | POA: Insufficient documentation

## 2015-03-14 DIAGNOSIS — F329 Major depressive disorder, single episode, unspecified: Secondary | ICD-10-CM | POA: Diagnosis not present

## 2015-03-14 DIAGNOSIS — Z79899 Other long term (current) drug therapy: Secondary | ICD-10-CM | POA: Diagnosis not present

## 2015-03-14 DIAGNOSIS — E039 Hypothyroidism, unspecified: Secondary | ICD-10-CM | POA: Diagnosis not present

## 2015-03-14 HISTORY — PX: DILITATION & CURRETTAGE/HYSTROSCOPY WITH NOVASURE ABLATION: SHX5568

## 2015-03-14 LAB — BASIC METABOLIC PANEL
Anion gap: 6 (ref 5–15)
BUN: 15 mg/dL (ref 6–20)
CO2: 25 mmol/L (ref 22–32)
Calcium: 8.4 mg/dL — ABNORMAL LOW (ref 8.9–10.3)
Chloride: 107 mmol/L (ref 101–111)
Creatinine, Ser: 0.79 mg/dL (ref 0.44–1.00)
GFR calc Af Amer: >60 mL/min (ref >60)
GFR calc non Af Amer: >60 mL/min (ref >60)
Glucose, Bld: 102 mg/dL — ABNORMAL HIGH (ref 65–99)
Potassium: 3.6 mmol/L (ref 3.5–5.1)
Sodium: 138 mmol/L (ref 135–145)

## 2015-03-14 LAB — CBC
HCT: 38.5 % (ref 36.0–46.0)
Hemoglobin: 13 g/dL (ref 12.0–15.0)
MCH: 32.3 pg (ref 26.0–34.0)
MCHC: 33.8 g/dL (ref 30.0–36.0)
MCV: 95.5 fL (ref 78.0–100.0)
Platelets: 160 10*3/uL (ref 150–400)
RBC: 4.03 MIL/uL (ref 3.87–5.11)
RDW: 13.9 % (ref 11.5–15.5)
WBC: 5.5 10*3/uL (ref 4.0–10.5)

## 2015-03-14 SURGERY — DILATATION & CURETTAGE/HYSTEROSCOPY WITH NOVASURE ABLATION
Anesthesia: General | Site: Vagina

## 2015-03-14 MED ORDER — GLYCOPYRROLATE 0.2 MG/ML IJ SOLN
INTRAMUSCULAR | Status: DC | PRN
  Administered 2015-03-14: 0.1 mg via INTRAVENOUS

## 2015-03-14 MED ORDER — SCOPOLAMINE 1 MG/3DAYS TD PT72
MEDICATED_PATCH | TRANSDERMAL | Status: AC
  Filled 2015-03-14: qty 1

## 2015-03-14 MED ORDER — ONDANSETRON HCL 4 MG/2ML IJ SOLN
INTRAMUSCULAR | Status: AC
  Filled 2015-03-14: qty 2

## 2015-03-14 MED ORDER — SCOPOLAMINE 1 MG/3DAYS TD PT72
1.0000 | MEDICATED_PATCH | Freq: Once | TRANSDERMAL | Status: DC
  Administered 2015-03-14: 1.5 mg via TRANSDERMAL

## 2015-03-14 MED ORDER — EPHEDRINE SULFATE 50 MG/ML IJ SOLN
INTRAMUSCULAR | Status: DC | PRN
  Administered 2015-03-14: 5 mg via INTRAVENOUS

## 2015-03-14 MED ORDER — FENTANYL CITRATE (PF) 100 MCG/2ML IJ SOLN
INTRAMUSCULAR | Status: AC
  Filled 2015-03-14: qty 2

## 2015-03-14 MED ORDER — DEXAMETHASONE SODIUM PHOSPHATE 4 MG/ML IJ SOLN
INTRAMUSCULAR | Status: DC | PRN
  Administered 2015-03-14: 4 mg via INTRAVENOUS

## 2015-03-14 MED ORDER — HYDROMORPHONE HCL 1 MG/ML IJ SOLN
INTRAMUSCULAR | Status: AC
  Filled 2015-03-14: qty 1

## 2015-03-14 MED ORDER — PHENYLEPHRINE 40 MCG/ML (10ML) SYRINGE FOR IV PUSH (FOR BLOOD PRESSURE SUPPORT)
PREFILLED_SYRINGE | INTRAVENOUS | Status: AC
  Filled 2015-03-14: qty 10

## 2015-03-14 MED ORDER — HYDROMORPHONE HCL 1 MG/ML IJ SOLN
INTRAMUSCULAR | Status: DC | PRN
  Administered 2015-03-14: .2 mg via INTRAVENOUS
  Administered 2015-03-14: .3 mg via INTRAVENOUS
  Administered 2015-03-14: .2 mg via INTRAVENOUS

## 2015-03-14 MED ORDER — LACTATED RINGERS IV SOLN
INTRAVENOUS | Status: DC
  Administered 2015-03-14 (×2): via INTRAVENOUS

## 2015-03-14 MED ORDER — CEFAZOLIN SODIUM-DEXTROSE 2-3 GM-% IV SOLR: 2.0000 g | INTRAVENOUS | Status: DC

## 2015-03-14 MED ORDER — GLYCOPYRROLATE 0.2 MG/ML IJ SOLN
INTRAMUSCULAR | Status: AC
  Filled 2015-03-14: qty 1

## 2015-03-14 MED ORDER — MEPERIDINE HCL 25 MG/ML IJ SOLN
6.2500 mg | INTRAMUSCULAR | Status: DC | PRN
  Administered 2015-03-14: 12.5 mg via INTRAVENOUS

## 2015-03-14 MED ORDER — OXYCODONE-ACETAMINOPHEN 5-325 MG PO TABS: 1.0000 | ORAL_TABLET | ORAL | Status: DC | PRN

## 2015-03-14 MED ORDER — PROMETHAZINE HCL 25 MG/ML IJ SOLN: 6.2500 mg | INTRAMUSCULAR | Status: DC | PRN

## 2015-03-14 MED ORDER — FENTANYL CITRATE (PF) 100 MCG/2ML IJ SOLN
25.0000 ug | INTRAMUSCULAR | Status: DC | PRN
  Administered 2015-03-14: 50 ug via INTRAVENOUS

## 2015-03-14 MED ORDER — KETOROLAC TROMETHAMINE 30 MG/ML IJ SOLN
INTRAMUSCULAR | Status: AC
  Filled 2015-03-14: qty 1

## 2015-03-14 MED ORDER — LIDOCAINE HCL (CARDIAC) 20 MG/ML IV SOLN
INTRAVENOUS | Status: DC | PRN
  Administered 2015-03-14: 60 mg via INTRAVENOUS

## 2015-03-14 MED ORDER — PROPOFOL 10 MG/ML IV BOLUS
INTRAVENOUS | Status: AC
  Filled 2015-03-14: qty 20

## 2015-03-14 MED ORDER — ONDANSETRON HCL 4 MG/2ML IJ SOLN
INTRAMUSCULAR | Status: DC | PRN
  Administered 2015-03-14: 4 mg via INTRAVENOUS

## 2015-03-14 MED ORDER — OXYCODONE-ACETAMINOPHEN 5-325 MG PO TABS
1.0000 | ORAL_TABLET | ORAL | Status: DC | PRN
  Administered 2015-03-14: 1 via ORAL

## 2015-03-14 MED ORDER — LIDOCAINE HCL (CARDIAC) 20 MG/ML IV SOLN
INTRAVENOUS | Status: AC
Start: 2015-03-14 — End: 2015-03-14
  Filled 2015-03-14: qty 5

## 2015-03-14 MED ORDER — BUPIVACAINE HCL (PF) 0.25 % IJ SOLN
INTRAMUSCULAR | Status: DC | PRN
  Administered 2015-03-14: 20 mL

## 2015-03-14 MED ORDER — MEPERIDINE HCL 25 MG/ML IJ SOLN
INTRAMUSCULAR | Status: AC
  Filled 2015-03-14: qty 1

## 2015-03-14 MED ORDER — PROPOFOL 10 MG/ML IV BOLUS
INTRAVENOUS | Status: DC | PRN
  Administered 2015-03-14: 50 mg via INTRAVENOUS
  Administered 2015-03-14: 150 mg via INTRAVENOUS

## 2015-03-14 MED ORDER — KETOROLAC TROMETHAMINE 30 MG/ML IJ SOLN
INTRAMUSCULAR | Status: DC | PRN
  Administered 2015-03-14: 30 mg via INTRAVENOUS

## 2015-03-14 MED ORDER — MIDAZOLAM HCL 2 MG/2ML IJ SOLN
INTRAMUSCULAR | Status: DC | PRN
  Administered 2015-03-14: 2 mg via INTRAVENOUS

## 2015-03-14 MED ORDER — DEXAMETHASONE SODIUM PHOSPHATE 4 MG/ML IJ SOLN
INTRAMUSCULAR | Status: AC
Start: 2015-03-14 — End: 2015-03-14
  Filled 2015-03-14: qty 1

## 2015-03-14 MED ORDER — SODIUM CHLORIDE 0.9 % IJ SOLN
INTRAMUSCULAR | Status: AC
  Filled 2015-03-14: qty 10

## 2015-03-14 MED ORDER — EPHEDRINE 5 MG/ML INJ
INTRAVENOUS | Status: AC
  Filled 2015-03-14: qty 10

## 2015-03-14 MED ORDER — BUPIVACAINE HCL (PF) 0.25 % IJ SOLN
INTRAMUSCULAR | Status: AC
  Filled 2015-03-14: qty 30

## 2015-03-14 MED ORDER — FENTANYL CITRATE (PF) 100 MCG/2ML IJ SOLN
INTRAMUSCULAR | Status: AC
  Administered 2015-03-14: 50 ug via INTRAVENOUS
  Filled 2015-03-14: qty 2

## 2015-03-14 MED ORDER — OXYCODONE-ACETAMINOPHEN 5-325 MG PO TABS
ORAL_TABLET | ORAL | Status: DC
Start: 2015-03-14 — End: 2015-03-14
  Filled 2015-03-14: qty 1

## 2015-03-14 MED ORDER — CEFAZOLIN SODIUM-DEXTROSE 2-3 GM-% IV SOLR
INTRAVENOUS | Status: AC
  Administered 2015-03-14: 2 g via INTRAVENOUS
  Filled 2015-03-14: qty 50

## 2015-03-14 MED ORDER — MIDAZOLAM HCL 2 MG/2ML IJ SOLN
INTRAMUSCULAR | Status: AC
  Filled 2015-03-14: qty 2

## 2015-03-14 MED ORDER — LACTATED RINGERS IR SOLN
Status: DC | PRN
  Administered 2015-03-14: 3000 mL

## 2015-03-14 SURGICAL SUPPLY — 14 items
ABLATOR ENDOMETRIAL BIPOLAR (ABLATOR) ×2 IMPLANT
CATH ROBINSON RED A/P 16FR (CATHETERS) ×2 IMPLANT
CLOTH BEACON ORANGE TIMEOUT ST (SAFETY) ×2 IMPLANT
CONTAINER PREFILL 10% NBF 60ML (FORM) ×4 IMPLANT
GLOVE BIO SURGEON STRL SZ7.5 (GLOVE) ×2 IMPLANT
GOWN STRL REUS W/TWL LRG LVL3 (GOWN DISPOSABLE) ×4 IMPLANT
PACK VAGINAL MINOR WOMEN LF (CUSTOM PROCEDURE TRAY) ×2 IMPLANT
PAD OB MATERNITY 4.3X12.25 (PERSONAL CARE ITEMS) ×2 IMPLANT
PAD PREP 24X48 CUFFED NSTRL (MISCELLANEOUS) ×2 IMPLANT
SYR TB 1ML 25GX5/8 (SYRINGE) ×2 IMPLANT
TOWEL OR 17X24 6PK STRL BLUE (TOWEL DISPOSABLE) ×4 IMPLANT
TUBING AQUILEX INFLOW (TUBING) ×2 IMPLANT
TUBING AQUILEX OUTFLOW (TUBING) ×2 IMPLANT
WATER STERILE IRR 1000ML POUR (IV SOLUTION) ×2 IMPLANT

## 2015-03-14 NOTE — Anesthesia Procedure Notes (Signed)
Procedure Name: LMA Insertion Date/Time: 03/14/2015 12:35 PM Performed by: Graciela HusbandsFUSSELL, Tenna Lacko O Pre-anesthesia Checklist: Patient identified, Patient being monitored, Timeout performed, Emergency Drugs available and Suction available Patient Re-evaluated:Patient Re-evaluated prior to inductionOxygen Delivery Method: Circle system utilized Preoxygenation: Pre-oxygenation with 100% oxygen Intubation Type: IV induction Ventilation: Mask ventilation without difficulty LMA: LMA inserted LMA Size: 4.0 Number of attempts: 1 Placement Confirmation: breath sounds checked- equal and bilateral and positive ETCO2 Tube secured with: Tape Dental Injury: Teeth and Oropharynx as per pre-operative assessment

## 2015-03-14 NOTE — Discharge Instructions (Signed)

## 2015-03-14 NOTE — Transfer of Care (Signed)
Immediate Anesthesia Transfer of Care Note  Patient: Carla [name]  Procedure(s) Performed: Procedure(s): DILATATION & CURETTAGE/HYSTEROSCOPY WITH POSSIBLE NOVASURE ABLATION (N/A)  Patient Location: PACU  Anesthesia Type:General  Level of Consciousness: awake, alert  and oriented  Airway & Oxygen Therapy: Patient Spontanous Breathing and Patient connected to nasal cannula oxygen  Post-op Assessment: Report given to RN and Post -op Vital signs reviewed and stable  Post vital signs: Reviewed and stable  Last Vitals:  Filed Vitals:   03/14/15 1109  BP: 119/72  Pulse:   Temp:   Resp:     Complications: No apparent anesthesia complications

## 2015-03-14 NOTE — Anesthesia Postprocedure Evaluation (Signed)
  Anesthesia Post-op Note  Patient: Carla [name]  Procedure(s) Performed: Procedure(s): DILATATION & CURETTAGE/HYSTEROSCOPY WITH POSSIBLE NOVASURE ABLATION (N/A)  Patient Location: PACU  Anesthesia Type:General  Level of Consciousness: awake  Airway and Oxygen Therapy: Patient Spontanous Breathing  Post-op Pain: none  Post-op Assessment: Post-op Vital signs reviewed, Patient's Cardiovascular Status Stable, Respiratory Function Stable, Patent Airway and No signs of Nausea or vomiting  Post-op Vital Signs: Reviewed and stable  Last Vitals:  Filed Vitals:   03/14/15 1109  BP: 119/72  Pulse:   Temp:   Resp:     Complications: No apparent anesthesia complications

## 2015-03-14 NOTE — Op Note (Signed)
03/14/2015  12:59 PM  PATIENT:  Carla [name]  54 y.o. female  PRE-OPERATIVE DIAGNOSIS:  POST MENOPAUSAL BLEEDING   POST-OPERATIVE DIAGNOSIS:  POST MENOPAUSAL BLEEDING  ENDOCERVICAL POLYP  PROCEDURE:  Procedure(s): DILATATION & CURETTAGE DIAG HYSTEROSCOPY NOVASURE ABLATION REMOVAL OF ENDOCERVICAL POLYP  SURGEON:  Surgeon(s): Olivia Mackieichard Taray Normoyle, MD  ASSISTANTS: none NONE  ANESTHESIA:   local and general  ESTIMATED BLOOD LOSS: MINIMAL  DRAINS: none   LOCAL MEDICATIONS USED:  MARCAINE    and Amount: 20 ml  SPECIMEN:  Source of Specimen:  POLYP, [facility]  DISPOSITION OF SPECIMEN:  PATHOLOGY  COUNTS:  YES  DICTATION #: [id number]  PLAN OF CARE: DC HOME  PATIENT DISPOSITION:  PACU - hemodynamically stable.

## 2015-03-14 NOTE — Progress Notes (Signed)
Patient ID: Carla [name], female   DOB: 08/30/1961, 54 y.o.   MRN: [id number] Patient seen and examined. Consent witnessed and signed. No changes noted. Update completed.

## 2015-03-15 ENCOUNTER — Encounter (HOSPITAL_COMMUNITY): Payer: Self-pay | Admitting: Obstetrics and Gynecology

## 2015-03-15 NOTE — Op Note (Signed)
NAMGeralynn [name]:  [name], [name]            ACCOUNT NO.:  [id number]  MEDICAL RECORD NO.:  [id number]  LOCATION:  [facility]                          FACILITY:  [facility]  PHYSICIAN:  Lenoard Adenichard J. Katonya Blecher, M.D.DATE OF BIRTH:  05-Nov-1960  DATE OF PROCEDURE:  03/14/2015 DATE OF DISCHARGE:  03/14/2015                              OPERATIVE REPORT   PREOPERATIVE DIAGNOSIS:  Postmenopausal bleeding.  POSTOPERATIVE DIAGNOSIS:  Postmenopausal bleeding plus endocervical polyp.  PROCEDURE:  Diagnostic hysteroscopy, D and C, removal of endocervical polyp.  NovaSure endometrial ablation.  SURGEON:  Lenoard Adenichard J. Philip Kotlyar, M.D.  ASSISTANT:  None.  ANESTHESIA:  General.  ESTIMATED BLOOD LOSS:  Less than 50 mL.  COMPLICATIONS:  None.  DRAINS:  None.  COUNTS:  Correct.  DISPOSITION:  The patient to recovery in good condition.  FLUID DEFICIT:  100 mL.  BRIEF OPERATIVE NOTE:  After being apprised of risks of anesthesia, infection, bleeding, injury to surrounding organs, possible need for repair, delayed versus immediate complications to include bowel and bladder injury, possible need for repair, the patient was brought to the operating room, where she was administered general anesthetic without complications.  Prepped and draped in usual sterile fashion.  Feet were carefully placed into the Yellofin stirrups.  Bladder was catheterized until empty.  Exam under anesthesia revealed a small anteflexed uterus and no adnexal masses.  Dilute paracervical block was placed using 20 mL of dilute Marcaine solution.  A sessile prolapsed endocervical polyp was identified and removed bluntly without difficulty.  Minimal bleeding noted.  Cervix easily dilated up to a #23 Pratt dilator.  Hysteroscopy reveals an otherwise normal endocervical canal, endometrial cavity, bilateral normal tubal ostia, and an atrophic appearing endometrial cavity.  At this time, D and C was performed using sharp curettage in a 4-quadrant method.   The NovaSure device was then placed, seated to a length of 6.5, a length of 3.8 and initiated for 39 seconds to 136 watts without difficulty after a negative CO2 test was performed.  Post NovaSure, reinspection of the endometrial cavity reveals a well-ablated endometrial cavity.  No evidence of perforation. Good hemostasis was noted.  The patient tolerated the procedure well, was awakened and transferred to recovery in good condition.     Lenoard Adenichard J. Milka Windholz, M.D.     RJT/MEDQ  D:  03/14/2015  T:  03/15/2015  Job:  [id number]

## 2015-03-31 ENCOUNTER — Inpatient Hospital Stay (HOSPITAL_COMMUNITY)
Admission: AD | Admit: 2015-03-31 | Discharge: 2015-03-31 | Disposition: A | Discharge status: Home / Self Care | Payer: BLUE CROSS/BLUE SHIELD | Source: Ambulatory Visit | Attending: Obstetrics & Gynecology | Admitting: Obstetrics & Gynecology

## 2015-03-31 ENCOUNTER — Inpatient Hospital Stay (HOSPITAL_COMMUNITY): Payer: BLUE CROSS/BLUE SHIELD

## 2015-03-31 ENCOUNTER — Encounter (HOSPITAL_COMMUNITY): Payer: Self-pay | Admitting: Obstetrics and Gynecology

## 2015-03-31 DIAGNOSIS — R109 Unspecified abdominal pain: Secondary | ICD-10-CM | POA: Diagnosis not present

## 2015-03-31 DIAGNOSIS — N939 Abnormal uterine and vaginal bleeding, unspecified: Secondary | ICD-10-CM | POA: Diagnosis not present

## 2015-03-31 HISTORY — DX: Abnormal uterine and vaginal bleeding, unspecified: N93.9

## 2015-03-31 LAB — CBC
HCT: 36 % (ref 36.0–46.0)
Hemoglobin: 12.3 g/dL (ref 12.0–15.0)
MCH: 32.4 pg (ref 26.0–34.0)
MCHC: 34.2 g/dL (ref 30.0–36.0)
MCV: 94.7 fL (ref 78.0–100.0)
Platelets: 152 10*3/uL (ref 150–400)
RBC: 3.8 MIL/uL — ABNORMAL LOW (ref 3.87–5.11)
RDW: 13.1 % (ref 11.5–15.5)
WBC: 7.2 10*3/uL (ref 4.0–10.5)

## 2015-03-31 MED ORDER — MEDROXYPROGESTERONE ACETATE 5 MG PO TABS: 5.0000 mg | ORAL_TABLET | Freq: Every day | ORAL | Status: DC

## 2015-03-31 MED ORDER — TRAMADOL HCL 50 MG PO TABS: 50.0000 mg | ORAL_TABLET | Freq: Four times a day (QID) | ORAL | Status: AC | PRN

## 2015-03-31 NOTE — Discharge Instructions (Signed)
Abnormal Uterine Bleeding Abnormal uterine bleeding can affect women at various stages in life, including teenagers, women in their reproductive years, pregnant women, and women who have reached menopause. Several kinds of uterine bleeding are considered abnormal, including:  Bleeding or spotting between periods.   Bleeding after sexual intercourse.   Bleeding that is heavier or more than normal.   Periods that last longer than usual.  Bleeding after menopause.  Many cases of abnormal uterine bleeding are minor and simple to treat, while others are more serious. Any type of abnormal bleeding should be evaluated by your health care provider. Treatment will depend on the cause of the bleeding. HOME CARE INSTRUCTIONS Monitor your condition for any changes. The following actions may help to alleviate any discomfort you are experiencing:  Avoid the use of tampons and douches as directed by your health care provider.  Change your pads frequently. You should get regular pelvic exams and Pap tests. Keep all follow-up appointments for diagnostic tests as directed by your health care provider.  SEEK MEDICAL CARE IF:   Your bleeding lasts more than 1 week.   You feel dizzy at times.  SEEK IMMEDIATE MEDICAL CARE IF:   You pass out.   You are changing pads every 15 to 30 minutes.   You have abdominal pain.  You have a fever.   You become sweaty or weak.   You are passing large blood clots from the vagina.   You start to feel nauseous and vomit. MAKE SURE YOU:   Understand these instructions.  Will watch your condition.  Will get help right away if you are not doing well or get worse. Document Released: 10/05/2005 Document Revised: 10/10/2013 Document Reviewed: 05/04/2013 [facility] Patient Information 2015 [place], [place]. This information is not intended to replace advice given to you by your health care provider. Make sure you discuss any questions you have with your  health care provider.  Hysteroscopy Hysteroscopy is a procedure used for looking inside the womb (uterus). It may be done for various reasons, including:  To evaluate abnormal bleeding, fibroid (benign, noncancerous) tumors, polyps, scar tissue (adhesions), and possibly cancer of the uterus.  To look for lumps (tumors) and other uterine growths.  To look for causes of why a woman cannot get pregnant (infertility), causes of recurrent loss of pregnancy (miscarriages), or a lost intrauterine device (IUD).  To perform a sterilization by blocking the fallopian tubes from inside the uterus. In this procedure, a thin, flexible tube with a tiny light and camera on the end of it (hysteroscope) is used to look inside the uterus. A hysteroscopy should be done right after a menstrual period to be sure you are not pregnant. LET [facility] CARE PROVIDER KNOW ABOUT:   Any allergies you have.  All medicines you are taking, including vitamins, herbs, eye drops, creams, and over-the-counter medicines.  Previous problems you or members of your family have had with the use of anesthetics.  Any blood disorders you have.  Previous surgeries you have had.  Medical conditions you have. RISKS AND COMPLICATIONS  Generally, this is a safe procedure. However, as with any procedure, complications can occur. Possible complications include:  Putting a hole in the uterus.  Excessive bleeding.  Infection.  Damage to the cervix.  Injury to other organs.  Allergic reaction to medicines.  Too much fluid used in the uterus for the procedure. BEFORE THE PROCEDURE   Ask your health care provider about changing or stopping any regular medicines.  Do not take aspirin or blood thinners for 1 week before the procedure, or as directed by your health care provider. These can cause bleeding.  If you smoke, do not smoke for 2 weeks before the procedure.  In some cases, a medicine is placed in the cervix the day  before the procedure. This medicine makes the cervix have a larger opening (dilate). This makes it easier for the instrument to be inserted into the uterus during the procedure.  Do not eat or drink anything for at least 8 hours before the surgery.  Arrange for someone to take you home after the procedure. PROCEDURE   You may be given a medicine to relax you (sedative). You may also be given one of the following:  A medicine that numbs the area around the cervix (local anesthetic).  A medicine that makes you sleep through the procedure (general anesthetic).  The hysteroscope is inserted through the vagina into the uterus. The camera on the hysteroscope sends a picture to a TV screen. This gives the surgeon a good view inside the uterus.  During the procedure, air or a liquid is put into the uterus, which allows the surgeon to see better.  Sometimes, tissue is gently scraped from inside the uterus. These tissue samples are sent to a lab for testing. AFTER THE PROCEDURE   If you had a general anesthetic, you may be groggy for a couple hours after the procedure.  If you had a local anesthetic, you will be able to go home as soon as you are stable and feel ready.  You may have some cramping. This normally lasts for a couple days.  You may have bleeding, which varies from light spotting for a few days to menstrual-like bleeding for 3-7 days. This is normal.  If your test results are not back during the visit, make an appointment with your health care provider to find out the results. Document Released: 01/11/2001 Document Revised: 07/26/2013 Document Reviewed: 05/04/2013 [employer] Patient Information 2015 [place], [place]. This information is not intended to replace advice given to you by your health care provider. Make sure you discuss any questions you have with your health care provider. Pelvic Pain Female pelvic pain can be caused by many different things and start from a variety of  places. Pelvic pain refers to pain that is located in the lower half of the abdomen and between your hips. The pain may occur over a short period of time (acute) or may be reoccurring (chronic). The cause of pelvic pain may be related to disorders affecting the female reproductive organs (gynecologic), but it may also be related to the bladder, kidney stones, an intestinal complication, or muscle or skeletal problems. Getting help right away for pelvic pain is important, especially if there has been severe, sharp, or a sudden onset of unusual pain. It is also important to get help right away because some types of pelvic pain can be life threatening.  CAUSES  Below are only some of the causes of pelvic pain. The causes of pelvic pain can be in one of several categories.   Gynecologic.  Pelvic inflammatory disease.  Sexually transmitted infection.  Ovarian cyst or a twisted ovarian ligament (ovarian torsion).  Uterine lining that grows outside the uterus (endometriosis).  Fibroids, cysts, or tumors.  Ovulation.  Pregnancy.  Pregnancy that occurs outside the uterus (ectopic pregnancy).  Miscarriage.  Labor.  Abruption of the placenta or ruptured uterus.  Infection.  Uterine infection (endometritis).  Bladder  infection.  Diverticulitis.  Miscarriage related to a uterine infection (septic abortion).  Bladder.  Inflammation of the bladder (cystitis).  Kidney stone(s).  Gastrointestinal.  Constipation.  Diverticulitis.  Neurologic.  Trauma.  Feeling pelvic pain because of mental or emotional causes (psychosomatic).  Cancers of the bowel or pelvis. EVALUATION  Your caregiver will want to take a careful history of your concerns. This includes recent changes in your health, a careful gynecologic history of your periods (menses), and a sexual history. Obtaining your family history and medical history is also important. Your caregiver may suggest a pelvic exam. A pelvic  exam will help identify the location and severity of the pain. It also helps in the evaluation of which organ system may be involved. In order to identify the cause of the pelvic pain and be properly treated, your caregiver may order tests. These tests may include:   A pregnancy test.  Pelvic ultrasonography.  An X-ray exam of the abdomen.  A urinalysis or evaluation of vaginal discharge.  Blood tests. HOME CARE INSTRUCTIONS   Only take over-the-counter or prescription medicines for pain, discomfort, or fever as directed by your caregiver.   Rest as directed by your caregiver.   Eat a balanced diet.   Drink enough fluids to make your urine clear or pale yellow, or as directed.   Avoid sexual intercourse if it causes pain.   Apply warm or cold compresses to the lower abdomen depending on which one helps the pain.   Avoid stressful situations.   Keep a journal of your pelvic pain. Write down when it started, where the pain is located, and if there are things that seem to be associated with the pain, such as food or your menstrual cycle.  Follow up with your caregiver as directed.  SEEK MEDICAL CARE IF:  Your medicine does not help your pain.  You have abnormal vaginal discharge. SEEK IMMEDIATE MEDICAL CARE IF:   You have heavy bleeding from the vagina.   Your pelvic pain increases.   You feel light-headed or faint.   You have chills.   You have pain with urination or blood in your urine.   You have uncontrolled diarrhea or vomiting.   You have a fever or persistent symptoms for more than 3 days.  You have a fever and your symptoms suddenly get worse.   You are being physically or sexually abused.  MAKE SURE YOU:  Understand these instructions.  Will watch your condition.  Will get help if you are not doing well or get worse. Document Released: 09/01/2004 Document Revised: 02/19/2014 Document Reviewed: 01/25/2012 [facility] Patient Information  2015 [place], [place]. This information is not intended to replace advice given to you by your health care provider. Make sure you discuss any questions you have with your health care provider.

## 2015-03-31 NOTE — MAU Note (Signed)
Pt presents to MAU with complaints of vaginal bleeding for 2 weeks post op. Reports that she had a D&E and hysteroscopy on May the 26th.

## 2015-03-31 NOTE — MAU Provider Note (Signed)
History     CSN: [id number]  Arrival date and time: 03/31/15 1139  Provider on unit: 1005 Provider at bedside: 1215     Chief Complaint  Patient presents with  . Vaginal Bleeding   HPI  Ms. Carla [name] is a 54 yo female presenting with complaints of heavy vaginal bleeding since D&C/Hysteroscopy on 5/26; there was a polyp removed as well. Given Doxycycline last week. Feels weak and dizzy. Her primary GYN provider at [facility] is Dr. Billy Coast.     Past Medical History  Diagnosis Date  . Allergy   . Anxiety   . Depression   . Arthritis   . Thyroid disease     hypothyroidism  . Endometriosis   . Osteoporosis   . Episode of heavy vaginal bleeding 03/31/2015    Past Surgical History  Procedure Laterality Date  . Dilation and curettage of uterus    . Laparoscopy abdomen diagnostic    . Lumbar disc surgery    . Left shoulder arthroscopy    . Cyst left 2nd finger    . Colonoscopy  04/28/11    Normal  . Dilitation & currettage/hystroscopy with novasure ablation N/A 03/14/2015    Procedure: DILATATION & CURETTAGE/HYSTEROSCOPY WITH POSSIBLE NOVASURE ABLATION;  Surgeon: Olivia Mackie, MD;  Location: [facility];  Service: Gynecology;  Laterality: N/A;    History reviewed. No pertinent family history.  History  Substance Use Topics  . Smoking status: Never Smoker   . Smokeless tobacco: Not on file  . Alcohol Use: 2.4 oz/week    4 Glasses of wine per week    Allergies:  Allergies  Allergen Reactions  . Clarithromycin Nausea And Vomiting  . Codeine Other (See Comments)    headache  . Erythromycin Nausea And Vomiting  . Trazodone And Nefazodone     Single-dose of Trazodone caused severe nasal congestion.    Prescriptions prior to admission  Medication Sig Dispense Refill Last Dose  . acetaminophen (TYLENOL) 650 MG CR tablet Take 650-1,300 mg by mouth 2 (two) times daily as needed for pain (back pain).   03/30/2015 at Unknown time  . calcium citrate-vitamin D (CITRACAL+D)  315-200 MG-UNIT per tablet Take 1 tablet by mouth 2 (two) times daily.   03/31/2015 at Unknown time  . desvenlafaxine (PRISTIQ) 50 MG 24 hr tablet Take 50 mg by mouth daily.   03/31/2015 at Unknown time  . diazepam (VALIUM) 10 MG tablet Take 5-10 mg by mouth 3 (three) times daily. Take 5 mg twice daily and take 10 mg daily at bedtime.   03/31/2015 at Unknown time  . docusate sodium (COLACE) 100 MG capsule Take 100 mg by mouth daily.   03/31/2015 at Unknown time  . ibuprofen (ADVIL,MOTRIN) 600 MG tablet Take 600 mg by mouth 3 (three) times daily.   03/31/2015 at Unknown time  . levothyroxine (SYNTHROID, LEVOTHROID) 112 MCG tablet Take 1 tablet (112 mcg total) by mouth daily before breakfast.   03/31/2015 at Unknown time  . Magnesium 400 MG TABS Take 1 tablet by mouth daily.   03/31/2015 at Unknown time  . Melatonin 5 MG CAPS Take 1 capsule by mouth at bedtime.   03/30/2015 at Unknown time  . mirtazapine (REMERON) 30 MG tablet Take 30 mg by mouth at bedtime.   03/30/2015 at Unknown time  . norethindrone (AYGESTIN) 5 MG tablet Take 5-10 mg by mouth daily.   Past Month at Unknown time  . OMEGA FATTY ACIDS-VITAMINS PO Take 2 capsules by mouth daily.  03/31/2015 at Unknown time  . oxyCODONE-acetaminophen (ROXICET) 5-325 MG per tablet Take 1-2 tablets by mouth every 4 (four) hours as needed for severe pain. 30 tablet 0 03/30/2015 at Unknown time  . sulfacetamide (BLEPH-10) 10 % ophthalmic ointment Apply 2 drops to eye every 6 (six) hours.   03/31/2015 at Unknown time  . valACYclovir (VALTREX) 1000 MG tablet Take 0.5 tablets (500 mg total) by mouth daily. (Patient taking differently: Take 1,000 mg by mouth 3 (three) times daily. )   03/31/2015 at Unknown time  . Brexpiprazole 1 MG TABS Take 1 tablet by mouth daily.   03/14/2015 at 0700  . hydrOXYzine (ATARAX/VISTARIL) 25 MG tablet Take 1 tablet (25 mg total) by mouth every 6 (six) hours as needed for anxiety (may take one extra at bedtime for sleep, total of 50 mg).  (Patient not taking: Reported on 03/07/2015) 30 tablet 0 Not Taking at Unknown time  . liothyronine (CYTOMEL) 5 MCG tablet Take 1 tablet (5 mcg total) by mouth daily. (Patient not taking: Reported on 03/07/2015)   Not Taking at Unknown time  . mometasone (NASONEX) 50 MCG/ACT nasal spray Place 2 sprays into the nose daily. 17 g  prn  . Polyethyl Glycol-Propyl Glycol (SYSTANE OP) Apply 1 drop to eye as needed (dry eyes).   Not Taking at Unknown time    Review of Systems  Constitutional: Positive for malaise/fatigue.  HENT: Negative.   Eyes: Negative.   Respiratory: Negative.   Gastrointestinal: Positive for abdominal pain.  Genitourinary:       Heavy vaginal bleeding since surgery on 5/26; cramping   Musculoskeletal: Negative.   Skin: Negative.   Neurological: Positive for dizziness and weakness.  Endo/Heme/Allergies: Negative.   Psychiatric/Behavioral: The patient is nervous/anxious.    Results for orders placed or performed during the hospital encounter of 03/31/15 (from the past 24 hour(s))  CBC     Status: Abnormal   Collection Time: 03/31/15 11:52 AM  Result Value Ref Range   WBC 7.2 4.0 - 10.5 K/uL   RBC 3.80 (L) 3.87 - 5.11 MIL/uL   Hemoglobin 12.3 12.0 - 15.0 g/dL   HCT 16.1 09.6 - 04.5 %   MCV 94.7 78.0 - 100.0 fL   MCH 32.4 26.0 - 34.0 pg   MCHC 34.2 30.0 - 36.0 g/dL   RDW 40.9 81.1 - 91.4 %   Platelets 152 150 - 400 K/uL   US Pelvis Complete  03/31/2015   CLINICAL DATA:  54 year old female with uterine bleeding since 03/14/2015. Patient underwent endometrial polypectomy and ablation that same day.  EXAM: TRANSABDOMINAL AND TRANSVAGINAL ULTRASOUND OF PELVIS  TECHNIQUE: Both transabdominal and transvaginal ultrasound examinations of the pelvis were performed. Transabdominal technique was performed for global imaging of the pelvis including uterus, ovaries, adnexal regions, and pelvic cul-de-sac. It was necessary to proceed with endovaginal exam following the transabdominal  exam to visualize the endometrium.  COMPARISON:  None  FINDINGS: Uterus  Measurements: 9.2 x 4.2 x 5.2 cm. Mildly heterogeneous. No discrete fibroid or other mass visualized.  Endometrium  Thickness: Mildly thickened at 14 mm. Complex echogenic fluid can be visualized moving under real-time sonography consistent with bleeding. At least 1 low-attenuation subendometrial cyst is present.  Right ovary  Measurements: 2.1 x 1.1 x 1.4 cm. Normal appearance/no adnexal mass.  Left ovary  Measurements: 3.0 x 1.1 x 1.4 cm. Normal appearance/no adnexal mass.  Other findings  No free fluid.  IMPRESSION: 1. Heterogeneous, ill-defined and thickened endometrial lining measuring up to  14 mm. Complex fluid is noted flowing within the endometrial canal consistent with the clinical history of bleeding. The imaging appearance is nonspecific but could be consistent with post polypectomy and post endometrial ablation changes. 2. Mild heterogeneity of the uterine parenchyma with at least 1 small sub-endometrial cyst. This raises the possibility of underlying adenomyosis.  Electronically Signed By: Malachy Moan M.D. On: 03/31/2015 13:21   Physical Exam   Blood pressure 107/76, pulse 83, temperature 98.1 F (36.7 C), resp. rate 18, height 5\' 4"  (1.626 m), weight 58.06 kg (128 lb), last menstrual period 04/22/2011.  Physical Exam  Constitutional: She is oriented to person, place, and time. She appears well-developed and well-nourished.  HENT:  Head: Normocephalic and atraumatic.  Eyes: Pupils are equal, round, and reactive to light.  Neck: Normal range of motion.  Cardiovascular: Normal rate, regular rhythm and normal heart sounds.   Respiratory: Effort normal and breath sounds normal.  GI: Soft. Bowel sounds are normal.  Genitourinary:  Small amount of dark red vaginal bleeding noted on speculum exam; mild tenderness with bimanual exam; no adnexal masses palpated; no CMT  Musculoskeletal: Normal range of motion.   Neurological: She is alert and oriented to person, place, and time.  Skin: Skin is warm and dry.  Psychiatric:  Mildly anxious    MAU Course  Procedures  Assessment and Plan  54 yo S/P D&C / Hysteroscopy / Endometrial Ablation / Endometrial Polypectomy Abnormal Vaginal Bleeding Abdominal Cramping  Discharge Home Provera 5 mg po x 10 days Ultram 50 mg po every 6 hrs prn pain Call the office, if bleeding increases/worsens  **Dr. Seymour Bars notified of assessment and plan - agrees     Kenard Gower MSN, CNM 03/31/2015, 12:38 PM

## 2015-10-15 ENCOUNTER — Other Ambulatory Visit: Payer: Self-pay

## 2015-10-15 DIAGNOSIS — Z1231 Encounter for screening mammogram for malignant neoplasm of breast: Secondary | ICD-10-CM

## 2015-11-08 ENCOUNTER — Ambulatory Visit
Admission: RE | Admit: 2015-11-08 | Discharge: 2015-11-08 | Disposition: A | Discharge status: Home / Self Care | Payer: BLUE CROSS/BLUE SHIELD | Source: Ambulatory Visit

## 2015-11-08 DIAGNOSIS — Z1231 Encounter for screening mammogram for malignant neoplasm of breast: Secondary | ICD-10-CM

## 2016-01-21 DIAGNOSIS — E039 Hypothyroidism, unspecified: Secondary | ICD-10-CM | POA: Diagnosis not present

## 2016-01-27 DIAGNOSIS — F411 Generalized anxiety disorder: Secondary | ICD-10-CM | POA: Diagnosis not present

## 2016-02-24 DIAGNOSIS — F411 Generalized anxiety disorder: Secondary | ICD-10-CM | POA: Diagnosis not present

## 2016-02-26 DIAGNOSIS — F411 Generalized anxiety disorder: Secondary | ICD-10-CM | POA: Diagnosis not present

## 2016-03-12 DIAGNOSIS — M533 Sacrococcygeal disorders, not elsewhere classified: Secondary | ICD-10-CM | POA: Diagnosis not present

## 2016-03-12 DIAGNOSIS — M4316 Spondylolisthesis, lumbar region: Secondary | ICD-10-CM | POA: Diagnosis not present

## 2016-03-12 DIAGNOSIS — M5430 Sciatica, unspecified side: Secondary | ICD-10-CM | POA: Diagnosis not present

## 2016-03-30 DIAGNOSIS — F411 Generalized anxiety disorder: Secondary | ICD-10-CM | POA: Diagnosis not present

## 2016-04-06 DIAGNOSIS — R5383 Other fatigue: Secondary | ICD-10-CM | POA: Diagnosis not present

## 2016-04-06 DIAGNOSIS — E559 Vitamin D deficiency, unspecified: Secondary | ICD-10-CM | POA: Diagnosis not present

## 2016-04-06 DIAGNOSIS — M810 Age-related osteoporosis without current pathological fracture: Secondary | ICD-10-CM | POA: Diagnosis not present

## 2016-04-09 DIAGNOSIS — Z0000 Encounter for general adult medical examination without abnormal findings: Secondary | ICD-10-CM | POA: Diagnosis not present

## 2016-04-09 DIAGNOSIS — Z79899 Other long term (current) drug therapy: Secondary | ICD-10-CM | POA: Diagnosis not present

## 2016-04-09 DIAGNOSIS — E039 Hypothyroidism, unspecified: Secondary | ICD-10-CM | POA: Diagnosis not present

## 2016-04-15 DIAGNOSIS — M810 Age-related osteoporosis without current pathological fracture: Secondary | ICD-10-CM | POA: Diagnosis not present

## 2016-04-15 DIAGNOSIS — M20012 Mallet finger of left finger(s): Secondary | ICD-10-CM | POA: Diagnosis not present

## 2016-04-15 DIAGNOSIS — E559 Vitamin D deficiency, unspecified: Secondary | ICD-10-CM | POA: Diagnosis not present

## 2016-04-16 DIAGNOSIS — F411 Generalized anxiety disorder: Secondary | ICD-10-CM | POA: Diagnosis not present

## 2016-04-16 DIAGNOSIS — E039 Hypothyroidism, unspecified: Secondary | ICD-10-CM | POA: Diagnosis not present

## 2016-04-16 DIAGNOSIS — Z0000 Encounter for general adult medical examination without abnormal findings: Secondary | ICD-10-CM | POA: Diagnosis not present

## 2016-04-16 DIAGNOSIS — M810 Age-related osteoporosis without current pathological fracture: Secondary | ICD-10-CM | POA: Diagnosis not present

## 2016-04-23 DIAGNOSIS — M20012 Mallet finger of left finger(s): Secondary | ICD-10-CM | POA: Diagnosis not present

## 2016-04-23 DIAGNOSIS — E559 Vitamin D deficiency, unspecified: Secondary | ICD-10-CM | POA: Diagnosis not present

## 2016-04-27 DIAGNOSIS — F411 Generalized anxiety disorder: Secondary | ICD-10-CM | POA: Diagnosis not present

## 2016-05-08 DIAGNOSIS — M20012 Mallet finger of left finger(s): Secondary | ICD-10-CM | POA: Diagnosis not present

## 2016-05-27 DIAGNOSIS — F411 Generalized anxiety disorder: Secondary | ICD-10-CM | POA: Diagnosis not present

## 2016-06-01 DIAGNOSIS — F411 Generalized anxiety disorder: Secondary | ICD-10-CM | POA: Diagnosis not present

## 2016-06-03 DIAGNOSIS — M533 Sacrococcygeal disorders, not elsewhere classified: Secondary | ICD-10-CM | POA: Diagnosis not present

## 2016-06-03 DIAGNOSIS — M546 Pain in thoracic spine: Secondary | ICD-10-CM | POA: Diagnosis not present

## 2016-06-03 DIAGNOSIS — M4316 Spondylolisthesis, lumbar region: Secondary | ICD-10-CM | POA: Diagnosis not present

## 2016-06-29 DIAGNOSIS — F411 Generalized anxiety disorder: Secondary | ICD-10-CM | POA: Diagnosis not present

## 2016-07-02 DIAGNOSIS — Z23 Encounter for immunization: Secondary | ICD-10-CM | POA: Diagnosis not present

## 2016-07-27 DIAGNOSIS — Z23 Encounter for immunization: Secondary | ICD-10-CM | POA: Diagnosis not present

## 2016-07-29 DIAGNOSIS — F411 Generalized anxiety disorder: Secondary | ICD-10-CM | POA: Diagnosis not present

## 2016-07-31 DIAGNOSIS — H524 Presbyopia: Secondary | ICD-10-CM | POA: Diagnosis not present

## 2016-08-19 DIAGNOSIS — J069 Acute upper respiratory infection, unspecified: Secondary | ICD-10-CM | POA: Diagnosis not present

## 2016-08-27 DIAGNOSIS — H0014 Chalazion left upper eyelid: Secondary | ICD-10-CM | POA: Diagnosis not present

## 2016-08-31 DIAGNOSIS — F411 Generalized anxiety disorder: Secondary | ICD-10-CM | POA: Diagnosis not present

## 2016-09-01 DIAGNOSIS — M8589 Other specified disorders of bone density and structure, multiple sites: Secondary | ICD-10-CM | POA: Diagnosis not present

## 2016-09-01 DIAGNOSIS — M533 Sacrococcygeal disorders, not elsewhere classified: Secondary | ICD-10-CM | POA: Diagnosis not present

## 2016-09-01 DIAGNOSIS — M546 Pain in thoracic spine: Secondary | ICD-10-CM | POA: Diagnosis not present

## 2016-09-01 DIAGNOSIS — M4316 Spondylolisthesis, lumbar region: Secondary | ICD-10-CM | POA: Diagnosis not present

## 2016-09-02 DIAGNOSIS — F411 Generalized anxiety disorder: Secondary | ICD-10-CM | POA: Diagnosis not present

## 2016-10-26 DIAGNOSIS — F411 Generalized anxiety disorder: Secondary | ICD-10-CM | POA: Diagnosis not present

## 2016-10-26 DIAGNOSIS — E559 Vitamin D deficiency, unspecified: Secondary | ICD-10-CM | POA: Diagnosis not present

## 2016-10-26 DIAGNOSIS — R5383 Other fatigue: Secondary | ICD-10-CM | POA: Diagnosis not present

## 2016-10-26 DIAGNOSIS — M810 Age-related osteoporosis without current pathological fracture: Secondary | ICD-10-CM | POA: Diagnosis not present

## 2016-10-29 DIAGNOSIS — M810 Age-related osteoporosis without current pathological fracture: Secondary | ICD-10-CM | POA: Diagnosis not present

## 2016-10-29 DIAGNOSIS — E559 Vitamin D deficiency, unspecified: Secondary | ICD-10-CM | POA: Diagnosis not present

## 2016-11-30 DIAGNOSIS — F411 Generalized anxiety disorder: Secondary | ICD-10-CM | POA: Diagnosis not present

## 2016-11-30 DIAGNOSIS — F331 Major depressive disorder, recurrent, moderate: Secondary | ICD-10-CM | POA: Diagnosis not present

## 2016-12-01 DIAGNOSIS — Z6825 Body mass index (BMI) 25.0-25.9, adult: Secondary | ICD-10-CM | POA: Diagnosis not present

## 2016-12-01 DIAGNOSIS — M533 Sacrococcygeal disorders, not elsewhere classified: Secondary | ICD-10-CM | POA: Diagnosis not present

## 2016-12-01 DIAGNOSIS — M4316 Spondylolisthesis, lumbar region: Secondary | ICD-10-CM | POA: Diagnosis not present

## 2016-12-01 DIAGNOSIS — M546 Pain in thoracic spine: Secondary | ICD-10-CM | POA: Diagnosis not present

## 2016-12-02 DIAGNOSIS — F411 Generalized anxiety disorder: Secondary | ICD-10-CM | POA: Diagnosis not present

## 2016-12-11 DIAGNOSIS — Z789 Other specified health status: Secondary | ICD-10-CM | POA: Diagnosis not present

## 2016-12-11 DIAGNOSIS — R5383 Other fatigue: Secondary | ICD-10-CM | POA: Diagnosis not present

## 2016-12-17 DIAGNOSIS — Z23 Encounter for immunization: Secondary | ICD-10-CM | POA: Diagnosis not present

## 2016-12-18 DIAGNOSIS — Z23 Encounter for immunization: Secondary | ICD-10-CM | POA: Diagnosis not present

## 2016-12-18 DIAGNOSIS — Z6824 Body mass index (BMI) 24.0-24.9, adult: Secondary | ICD-10-CM | POA: Diagnosis not present

## 2016-12-18 DIAGNOSIS — Z1231 Encounter for screening mammogram for malignant neoplasm of breast: Secondary | ICD-10-CM | POA: Diagnosis not present

## 2016-12-18 DIAGNOSIS — Z01419 Encounter for gynecological examination (general) (routine) without abnormal findings: Secondary | ICD-10-CM | POA: Diagnosis not present

## 2016-12-30 DIAGNOSIS — F331 Major depressive disorder, recurrent, moderate: Secondary | ICD-10-CM | POA: Diagnosis not present

## 2016-12-30 DIAGNOSIS — F411 Generalized anxiety disorder: Secondary | ICD-10-CM | POA: Diagnosis not present

## 2017-01-18 DIAGNOSIS — Z23 Encounter for immunization: Secondary | ICD-10-CM | POA: Diagnosis not present

## 2017-01-25 DIAGNOSIS — F411 Generalized anxiety disorder: Secondary | ICD-10-CM | POA: Diagnosis not present

## 2017-01-25 DIAGNOSIS — F331 Major depressive disorder, recurrent, moderate: Secondary | ICD-10-CM | POA: Diagnosis not present

## 2017-01-28 DIAGNOSIS — E039 Hypothyroidism, unspecified: Secondary | ICD-10-CM | POA: Diagnosis not present

## 2017-02-15 DIAGNOSIS — M5136 Other intervertebral disc degeneration, lumbar region: Secondary | ICD-10-CM | POA: Diagnosis not present

## 2017-02-15 DIAGNOSIS — M545 Low back pain: Secondary | ICD-10-CM | POA: Diagnosis not present

## 2017-02-22 DIAGNOSIS — F331 Major depressive disorder, recurrent, moderate: Secondary | ICD-10-CM | POA: Diagnosis not present

## 2017-02-22 DIAGNOSIS — F411 Generalized anxiety disorder: Secondary | ICD-10-CM | POA: Diagnosis not present

## 2017-02-23 DIAGNOSIS — M546 Pain in thoracic spine: Secondary | ICD-10-CM | POA: Diagnosis not present

## 2017-02-23 DIAGNOSIS — M4316 Spondylolisthesis, lumbar region: Secondary | ICD-10-CM | POA: Diagnosis not present

## 2017-02-23 DIAGNOSIS — M533 Sacrococcygeal disorders, not elsewhere classified: Secondary | ICD-10-CM | POA: Diagnosis not present

## 2017-03-02 DIAGNOSIS — F4310 Post-traumatic stress disorder, unspecified: Secondary | ICD-10-CM | POA: Diagnosis not present

## 2017-03-02 DIAGNOSIS — F411 Generalized anxiety disorder: Secondary | ICD-10-CM | POA: Diagnosis not present

## 2017-03-02 DIAGNOSIS — F332 Major depressive disorder, recurrent severe without psychotic features: Secondary | ICD-10-CM | POA: Diagnosis not present

## 2017-03-30 DIAGNOSIS — F411 Generalized anxiety disorder: Secondary | ICD-10-CM | POA: Diagnosis not present

## 2017-03-30 DIAGNOSIS — F331 Major depressive disorder, recurrent, moderate: Secondary | ICD-10-CM | POA: Diagnosis not present

## 2017-04-13 DIAGNOSIS — F411 Generalized anxiety disorder: Secondary | ICD-10-CM | POA: Diagnosis not present

## 2017-05-04 DIAGNOSIS — Z0000 Encounter for general adult medical examination without abnormal findings: Secondary | ICD-10-CM | POA: Diagnosis not present

## 2017-05-04 DIAGNOSIS — E039 Hypothyroidism, unspecified: Secondary | ICD-10-CM | POA: Diagnosis not present

## 2017-05-10 DIAGNOSIS — F411 Generalized anxiety disorder: Secondary | ICD-10-CM | POA: Diagnosis not present

## 2017-05-10 DIAGNOSIS — F331 Major depressive disorder, recurrent, moderate: Secondary | ICD-10-CM | POA: Diagnosis not present

## 2017-05-11 DIAGNOSIS — Z0000 Encounter for general adult medical examination without abnormal findings: Secondary | ICD-10-CM | POA: Diagnosis not present

## 2017-05-11 DIAGNOSIS — E039 Hypothyroidism, unspecified: Secondary | ICD-10-CM | POA: Diagnosis not present

## 2017-05-11 DIAGNOSIS — M810 Age-related osteoporosis without current pathological fracture: Secondary | ICD-10-CM | POA: Diagnosis not present

## 2017-05-11 DIAGNOSIS — R002 Palpitations: Secondary | ICD-10-CM | POA: Diagnosis not present

## 2017-05-12 DIAGNOSIS — M810 Age-related osteoporosis without current pathological fracture: Secondary | ICD-10-CM | POA: Diagnosis not present

## 2017-06-04 DIAGNOSIS — M4316 Spondylolisthesis, lumbar region: Secondary | ICD-10-CM | POA: Diagnosis not present

## 2017-06-04 DIAGNOSIS — M5430 Sciatica, unspecified side: Secondary | ICD-10-CM | POA: Diagnosis not present

## 2017-06-04 DIAGNOSIS — M533 Sacrococcygeal disorders, not elsewhere classified: Secondary | ICD-10-CM | POA: Diagnosis not present

## 2017-06-04 DIAGNOSIS — M546 Pain in thoracic spine: Secondary | ICD-10-CM | POA: Diagnosis not present

## 2017-06-07 DIAGNOSIS — F411 Generalized anxiety disorder: Secondary | ICD-10-CM | POA: Diagnosis not present

## 2017-06-07 DIAGNOSIS — F4310 Post-traumatic stress disorder, unspecified: Secondary | ICD-10-CM | POA: Diagnosis not present

## 2017-06-07 DIAGNOSIS — F332 Major depressive disorder, recurrent severe without psychotic features: Secondary | ICD-10-CM | POA: Diagnosis not present

## 2017-06-17 DIAGNOSIS — F331 Major depressive disorder, recurrent, moderate: Secondary | ICD-10-CM | POA: Diagnosis not present

## 2017-06-17 DIAGNOSIS — F411 Generalized anxiety disorder: Secondary | ICD-10-CM | POA: Diagnosis not present

## 2017-06-22 DIAGNOSIS — Z23 Encounter for immunization: Secondary | ICD-10-CM | POA: Diagnosis not present

## 2017-07-05 DIAGNOSIS — E039 Hypothyroidism, unspecified: Secondary | ICD-10-CM | POA: Diagnosis not present

## 2017-07-30 DIAGNOSIS — Z23 Encounter for immunization: Secondary | ICD-10-CM | POA: Diagnosis not present

## 2017-08-03 DIAGNOSIS — B0052 Herpesviral keratitis: Secondary | ICD-10-CM | POA: Diagnosis not present

## 2017-08-31 DIAGNOSIS — F411 Generalized anxiety disorder: Secondary | ICD-10-CM | POA: Diagnosis not present

## 2017-09-02 DIAGNOSIS — M546 Pain in thoracic spine: Secondary | ICD-10-CM | POA: Diagnosis not present

## 2017-09-02 DIAGNOSIS — M4316 Spondylolisthesis, lumbar region: Secondary | ICD-10-CM | POA: Diagnosis not present

## 2017-09-02 DIAGNOSIS — M5430 Sciatica, unspecified side: Secondary | ICD-10-CM | POA: Diagnosis not present

## 2017-09-02 DIAGNOSIS — M533 Sacrococcygeal disorders, not elsewhere classified: Secondary | ICD-10-CM | POA: Diagnosis not present

## 2017-09-08 DIAGNOSIS — F331 Major depressive disorder, recurrent, moderate: Secondary | ICD-10-CM | POA: Diagnosis not present

## 2017-09-08 DIAGNOSIS — F411 Generalized anxiety disorder: Secondary | ICD-10-CM | POA: Diagnosis not present

## 2017-10-08 DIAGNOSIS — F331 Major depressive disorder, recurrent, moderate: Secondary | ICD-10-CM | POA: Diagnosis not present

## 2017-10-08 DIAGNOSIS — F411 Generalized anxiety disorder: Secondary | ICD-10-CM | POA: Diagnosis not present

## 2017-11-11 DIAGNOSIS — M810 Age-related osteoporosis without current pathological fracture: Secondary | ICD-10-CM | POA: Diagnosis not present

## 2017-11-11 DIAGNOSIS — R5383 Other fatigue: Secondary | ICD-10-CM | POA: Diagnosis not present

## 2017-11-11 DIAGNOSIS — E559 Vitamin D deficiency, unspecified: Secondary | ICD-10-CM | POA: Diagnosis not present

## 2017-11-12 DIAGNOSIS — M810 Age-related osteoporosis without current pathological fracture: Secondary | ICD-10-CM | POA: Diagnosis not present

## 2017-11-29 DIAGNOSIS — R030 Elevated blood-pressure reading, without diagnosis of hypertension: Secondary | ICD-10-CM | POA: Diagnosis not present

## 2017-11-29 DIAGNOSIS — M546 Pain in thoracic spine: Secondary | ICD-10-CM | POA: Diagnosis not present

## 2017-11-29 DIAGNOSIS — Z6828 Body mass index (BMI) 28.0-28.9, adult: Secondary | ICD-10-CM | POA: Diagnosis not present

## 2017-11-29 DIAGNOSIS — M4316 Spondylolisthesis, lumbar region: Secondary | ICD-10-CM | POA: Diagnosis not present

## 2017-11-30 DIAGNOSIS — F411 Generalized anxiety disorder: Secondary | ICD-10-CM | POA: Diagnosis not present

## 2017-11-30 DIAGNOSIS — F331 Major depressive disorder, recurrent, moderate: Secondary | ICD-10-CM | POA: Diagnosis not present

## 2017-11-30 DIAGNOSIS — F4310 Post-traumatic stress disorder, unspecified: Secondary | ICD-10-CM | POA: Diagnosis not present

## 2017-12-28 DIAGNOSIS — F411 Generalized anxiety disorder: Secondary | ICD-10-CM | POA: Diagnosis not present

## 2017-12-28 DIAGNOSIS — F331 Major depressive disorder, recurrent, moderate: Secondary | ICD-10-CM | POA: Diagnosis not present

## 2018-01-25 DIAGNOSIS — F411 Generalized anxiety disorder: Secondary | ICD-10-CM | POA: Diagnosis not present

## 2018-01-25 DIAGNOSIS — F4310 Post-traumatic stress disorder, unspecified: Secondary | ICD-10-CM | POA: Diagnosis not present

## 2018-01-25 DIAGNOSIS — F331 Major depressive disorder, recurrent, moderate: Secondary | ICD-10-CM | POA: Diagnosis not present

## 2018-02-08 DIAGNOSIS — Z01419 Encounter for gynecological examination (general) (routine) without abnormal findings: Secondary | ICD-10-CM | POA: Diagnosis not present

## 2018-02-08 DIAGNOSIS — Z1231 Encounter for screening mammogram for malignant neoplasm of breast: Secondary | ICD-10-CM | POA: Diagnosis not present

## 2018-02-08 DIAGNOSIS — Z6826 Body mass index (BMI) 26.0-26.9, adult: Secondary | ICD-10-CM | POA: Diagnosis not present

## 2018-02-22 DIAGNOSIS — M546 Pain in thoracic spine: Secondary | ICD-10-CM | POA: Diagnosis not present

## 2018-02-22 DIAGNOSIS — Z6826 Body mass index (BMI) 26.0-26.9, adult: Secondary | ICD-10-CM | POA: Diagnosis not present

## 2018-02-22 DIAGNOSIS — M4316 Spondylolisthesis, lumbar region: Secondary | ICD-10-CM | POA: Diagnosis not present

## 2018-02-22 DIAGNOSIS — F331 Major depressive disorder, recurrent, moderate: Secondary | ICD-10-CM | POA: Diagnosis not present

## 2018-02-22 DIAGNOSIS — F411 Generalized anxiety disorder: Secondary | ICD-10-CM | POA: Diagnosis not present

## 2018-03-17 DIAGNOSIS — L603 Nail dystrophy: Secondary | ICD-10-CM | POA: Diagnosis not present

## 2018-03-23 DIAGNOSIS — F411 Generalized anxiety disorder: Secondary | ICD-10-CM | POA: Diagnosis not present

## 2018-03-23 DIAGNOSIS — F331 Major depressive disorder, recurrent, moderate: Secondary | ICD-10-CM | POA: Diagnosis not present

## 2018-04-05 DIAGNOSIS — R269 Unspecified abnormalities of gait and mobility: Secondary | ICD-10-CM | POA: Diagnosis not present

## 2018-04-05 DIAGNOSIS — R42 Dizziness and giddiness: Secondary | ICD-10-CM | POA: Diagnosis not present

## 2018-04-07 ENCOUNTER — Ambulatory Visit: Payer: BLUE CROSS/BLUE SHIELD | Admitting: Neurology

## 2018-04-07 ENCOUNTER — Encounter: Payer: Self-pay | Admitting: Neurology

## 2018-04-07 VITALS — BP 107/71 | HR 80 | Ht 64.0 in | Wt 144.0 lb

## 2018-04-07 DIAGNOSIS — H81399 Other peripheral vertigo, unspecified ear: Secondary | ICD-10-CM | POA: Diagnosis not present

## 2018-04-07 DIAGNOSIS — R42 Dizziness and giddiness: Secondary | ICD-10-CM

## 2018-04-07 NOTE — Progress Notes (Signed)
Reason for visit: Dizziness  Referring physician: Dr. Gregor Hamsamachandran  Carla [name] is a 57 y.o. female  History of present illness:  Carla [name] is a 57 year old right-handed white female with a history of severe organic depression, she comes to this office with a history of some sensation of dizziness and gait instability over the last 18 months.  She has been placed on several psychoactive medications including gabapentin, diazepam, and Rexulti about 4 years ago.  The patient has had a gradual worsening of a sensation of imbalance that has occurred over the last year and a half, when she stands up she immediately gets a sensation of lightheadedness, as if she is rolling forward, when she walks she feels somewhat bouncy.  If she reaches over and touches a table or chair the sensation markedly improves but does not normalize.  When she sits down, she feels normal.  She denies any true vertigo.  She reports no headaches, she has no change in vision or speech or swallowing.  The patient has not had any falls.  She denies issues controlling the bowels or the bladder.  She denies any numbness or weakness of the extremities.  The patient does have some low back pain issues, she denies any neck pain.  She gives a history of significant insomnia that is better controlled on her current medications.  She is sent to this office for an evaluation.  Past Medical History:  Diagnosis Date  . Allergy   . Anxiety   . Arthritis   . Depression   . Endometriosis   . Episode of heavy vaginal bleeding 03/31/2015  . Osteoporosis   . Thyroid disease    hypothyroidism    Past Surgical History:  Procedure Laterality Date  . COLONOSCOPY  04/28/11   Normal  . cyst left 2nd finger    . DILATION AND CURETTAGE OF UTERUS    . DILITATION & CURRETTAGE/HYSTROSCOPY WITH NOVASURE ABLATION N/A 03/14/2015   Procedure: DILATATION & CURETTAGE/HYSTEROSCOPY WITH POSSIBLE NOVASURE ABLATION;  Surgeon: Olivia Mackieichard Taavon, MD;   Location: [facility];  Service: Gynecology;  Laterality: N/A;  . LAPAROSCOPY ABDOMEN DIAGNOSTIC    . left shoulder arthroscopy    . LUMBAR DISC SURGERY      History reviewed. No pertinent family history.  Social history:  reports that she has never smoked. She has never used smokeless tobacco. She reports that she drinks about 2.4 oz of alcohol per week. She reports that she does not use drugs.  Medications:  Prior to Admission medications   Medication Sig Start Date End Date Taking? Authorizing Provider  acetaminophen (TYLENOL) 650 MG CR tablet Take 650-1,300 mg by mouth 2 (two) times daily as needed for pain (back pain).   Yes [provider]  Brexpiprazole 1 MG TABS Take 1 tablet by mouth daily.   Yes [provider]  cholecalciferol (VITAMIN D) 1000 units tablet Take 1,000 Units by mouth daily.   Yes [provider]  cyclobenzaprine (FLEXERIL) 10 MG tablet TK 1 T PO TID PRN 02/11/18  Yes [provider]  desvenlafaxine (PRISTIQ) 50 MG 24 hr tablet Take 50 mg by mouth daily.   Yes [provider]  diazepam (VALIUM) 10 MG tablet Take 5-10 mg by mouth 3 (three) times daily. Take 5 mg twice daily and take 10 mg daily at bedtime.   Yes [provider]  docusate sodium (COLACE) 100 MG capsule Take 100 mg by mouth daily.   Yes [provider]  gabapentin (NEURONTIN) 600 MG tablet Take 600 mg by mouth 3 (three) times daily.   Yes [provider]  ibuprofen (ADVIL,MOTRIN) 600 MG tablet Take 600 mg by mouth 3 (three) times daily.   Yes [provider]  L-LYSINE PO Take 1,000 mg by mouth daily.   Yes [provider]  Magnesium 400 MG TABS Take 1 tablet by mouth daily.   Yes [provider]  Melatonin 5 MG CAPS Take 1 capsule by mouth at bedtime.   Yes [provider]  OMEGA FATTY ACIDS-VITAMINS PO Take 2 capsules by mouth daily.   Yes [provider]  oxyCODONE-acetaminophen (ROXICET)  5-325 MG per tablet Take 1-2 tablets by mouth every 4 (four) hours as needed for severe pain. 03/14/15  Yes Olivia Mackie, MD  SYNTHROID 125 MCG tablet Take 1 tablet by mouth daily. 04/07/18  Yes [provider]  traZODone (DESYREL) 100 MG tablet  03/31/18  Yes [provider]  valACYclovir (VALTREX) 1000 MG tablet Take 0.5 tablets (500 mg total) by mouth daily. Patient taking differently: Take 1,000 mg by mouth 3 (three) times daily.  07/07/14  Yes Charm Rings, NP      Allergies  Allergen Reactions  . Clarithromycin Nausea And Vomiting  . Codeine Other (See Comments)    headache  . Erythromycin Nausea And Vomiting  . Trazodone And Nefazodone     Single-dose of Trazodone caused severe nasal congestion.    ROS:  Out of a complete 14 system review of symptoms, the patient complains only of the following symptoms, and all other reviewed systems are negative.  Joint pain, joint swelling Dizziness Depression, anxiety Insomnia  Blood pressure 107/71, pulse 80, height 5\' 4"  (1.626 m), weight 144 lb (65.3 kg), last menstrual period 04/22/2011.   Blood pressure, right arm, standing is 122/80.  Blood pressure, right arm, sitting is 126/82.  Physical Exam  General: The patient is alert and cooperative at the time of the examination.  The patient has a flat affect.  Eyes: Pupils are equal, round, and reactive to light. Discs are flat bilaterally.  Neck: The neck is supple, no carotid bruits are noted.  Respiratory: The respiratory examination is clear.  Cardiovascular: The cardiovascular examination reveals a regular rate and rhythm, no obvious murmurs or rubs are noted.  Skin: Extremities are without significant edema.  Neurologic Exam  Mental status: The patient is alert and oriented x 3 at the time of the examination. The patient has apparent normal recent and remote memory, with an apparently normal attention span and concentration ability.  Cranial  nerves: Facial symmetry is present. There is good sensation of the face to pinprick and soft touch bilaterally. The strength of the facial muscles and the muscles to head turning and shoulder shrug are normal bilaterally. Speech is well enunciated, no aphasia or dysarthria is noted. Extraocular movements are full. Visual fields are full. The tongue is midline, and the patient has symmetric elevation of the soft palate. No obvious hearing deficits are noted.  Motor: The motor testing reveals 5 over 5 strength of all 4 extremities. Good symmetric motor tone is noted throughout.  Sensory: Sensory testing is intact to pinprick, soft touch, vibration sensation, and position sense on all 4 extremities. No evidence of extinction is noted.  Coordination: Cerebellar testing reveals good finger-nose-finger and heel-to-shin bilaterally.  Gait and station: Gait is normal, with exception that the patient has decreased arm swing bilaterally. Tandem gait is normal. Romberg is negative. No  drift is seen.  Reflexes: Deep tendon reflexes are symmetric and normal bilaterally.  The ankle jerk reflexes are well-maintained bilaterally.  Toes are downgoing bilaterally.   Assessment/Plan:  1.  Sensation of dizziness, instability  The patient reports dizziness, but it actually appears to be a sensation of imbalance.  My highest concern is that the patient may be having medication side effects, particularly from diazepam which may have long-acting metabolites that may affect her throughout the day, gabapentin may also produce a sensation of gait instability.  The patient indicates that her symptoms are usually better in the morning and worse as the day goes on.  The patient has some mild features of parkinsonism, she has masking of the face and decreased arm swing with walking, this is likely a side effect from the Rexulti and may also have some impact on her sensation of imbalance.  We will set the patient up for MRI of the  brain, she will have blood work done today.  If the studies are unremarkable, modification of her medication regimen could potentially help her symptoms.  She will follow-up in 4 months.  I discussed the possibility of switching to Xanax which is a short acting benzodiazepine, but she indicates that she could not sleep well on this medication previously.  Marlan Palau MD 04/07/2018 4:04 PM  [facility] [address] [place], [place] [address]  Phone [phone] Fax [phone]

## 2018-04-08 ENCOUNTER — Telehealth: Payer: Self-pay | Admitting: Neurology

## 2018-04-08 NOTE — Telephone Encounter (Signed)
MR Brain w/wo contarst Dr. Lockie MolaWillis [employer] Auth: [id number] (exp. 04/08/18 to 05/07/18). Pt is scheduled at [facility] for 04/13/18.

## 2018-04-09 LAB — SEDIMENTATION RATE: Sed Rate: 2 mm/hr (ref 0–40)

## 2018-04-09 LAB — RPR: RPR Ser Ql: NONREACTIVE

## 2018-04-09 LAB — B. BURGDORFI ANTIBODIES: Lyme IgG/IgM Ab: <0.91 {ISR} (ref 0.00–0.90)

## 2018-04-09 LAB — VITAMIN B12: Vitamin B-12: 414 pg/mL (ref 232–1245)

## 2018-04-09 LAB — ANA W/REFLEX: Anti Nuclear Antibody(ANA): NEGATIVE

## 2018-04-13 ENCOUNTER — Ambulatory Visit: Payer: BLUE CROSS/BLUE SHIELD

## 2018-04-13 DIAGNOSIS — R42 Dizziness and giddiness: Secondary | ICD-10-CM

## 2018-04-13 DIAGNOSIS — H81399 Other peripheral vertigo, unspecified ear: Secondary | ICD-10-CM | POA: Diagnosis not present

## 2018-04-13 MED ORDER — GADOPENTETATE DIMEGLUMINE 469.01 MG/ML IV SOLN
14.0000 mL | Freq: Once | INTRAVENOUS | Status: AC | PRN
  Administered 2018-04-13: 14 mL via INTRAVENOUS

## 2018-04-14 ENCOUNTER — Telehealth: Payer: Self-pay | Admitting: Neurology

## 2018-04-14 NOTE — Telephone Encounter (Signed)
I called the patient.  MRI of the brain is normal.  The patient likely has side effects from medication, I recommended cutting back on diazepam which she has already done.  If this does not elicit an improvement in her symptoms, she will cut back on the gabapentin next.   MRI brain 04/14/18:  IMPRESSION:   Normal MRI brain (with and without).

## 2018-04-19 DIAGNOSIS — F4310 Post-traumatic stress disorder, unspecified: Secondary | ICD-10-CM | POA: Diagnosis not present

## 2018-04-19 DIAGNOSIS — F411 Generalized anxiety disorder: Secondary | ICD-10-CM | POA: Diagnosis not present

## 2018-04-19 DIAGNOSIS — F331 Major depressive disorder, recurrent, moderate: Secondary | ICD-10-CM | POA: Diagnosis not present

## 2018-04-25 DIAGNOSIS — F331 Major depressive disorder, recurrent, moderate: Secondary | ICD-10-CM | POA: Diagnosis not present

## 2018-04-25 DIAGNOSIS — F411 Generalized anxiety disorder: Secondary | ICD-10-CM | POA: Diagnosis not present

## 2018-05-16 DIAGNOSIS — E559 Vitamin D deficiency, unspecified: Secondary | ICD-10-CM | POA: Diagnosis not present

## 2018-05-16 DIAGNOSIS — R5383 Other fatigue: Secondary | ICD-10-CM | POA: Diagnosis not present

## 2018-05-16 DIAGNOSIS — M810 Age-related osteoporosis without current pathological fracture: Secondary | ICD-10-CM | POA: Diagnosis not present

## 2018-05-17 DIAGNOSIS — F411 Generalized anxiety disorder: Secondary | ICD-10-CM | POA: Diagnosis not present

## 2018-05-17 DIAGNOSIS — F331 Major depressive disorder, recurrent, moderate: Secondary | ICD-10-CM | POA: Diagnosis not present

## 2018-05-18 DIAGNOSIS — M5136 Other intervertebral disc degeneration, lumbar region: Secondary | ICD-10-CM | POA: Diagnosis not present

## 2018-05-18 DIAGNOSIS — M810 Age-related osteoporosis without current pathological fracture: Secondary | ICD-10-CM | POA: Diagnosis not present

## 2018-05-23 DIAGNOSIS — F331 Major depressive disorder, recurrent, moderate: Secondary | ICD-10-CM | POA: Diagnosis not present

## 2018-05-23 DIAGNOSIS — F411 Generalized anxiety disorder: Secondary | ICD-10-CM | POA: Diagnosis not present

## 2018-05-24 DIAGNOSIS — Z0000 Encounter for general adult medical examination without abnormal findings: Secondary | ICD-10-CM | POA: Diagnosis not present

## 2018-05-24 DIAGNOSIS — E039 Hypothyroidism, unspecified: Secondary | ICD-10-CM | POA: Diagnosis not present

## 2018-05-24 DIAGNOSIS — M4316 Spondylolisthesis, lumbar region: Secondary | ICD-10-CM | POA: Diagnosis not present

## 2018-05-24 DIAGNOSIS — M533 Sacrococcygeal disorders, not elsewhere classified: Secondary | ICD-10-CM | POA: Diagnosis not present

## 2018-05-24 DIAGNOSIS — M546 Pain in thoracic spine: Secondary | ICD-10-CM | POA: Diagnosis not present

## 2018-05-25 DIAGNOSIS — F411 Generalized anxiety disorder: Secondary | ICD-10-CM | POA: Diagnosis not present

## 2018-05-25 DIAGNOSIS — F4310 Post-traumatic stress disorder, unspecified: Secondary | ICD-10-CM | POA: Diagnosis not present

## 2018-05-25 DIAGNOSIS — F331 Major depressive disorder, recurrent, moderate: Secondary | ICD-10-CM | POA: Diagnosis not present

## 2018-05-27 DIAGNOSIS — F411 Generalized anxiety disorder: Secondary | ICD-10-CM | POA: Diagnosis not present

## 2018-05-27 DIAGNOSIS — F331 Major depressive disorder, recurrent, moderate: Secondary | ICD-10-CM | POA: Diagnosis not present

## 2018-05-31 DIAGNOSIS — F411 Generalized anxiety disorder: Secondary | ICD-10-CM | POA: Diagnosis not present

## 2018-05-31 DIAGNOSIS — Z0000 Encounter for general adult medical examination without abnormal findings: Secondary | ICD-10-CM | POA: Diagnosis not present

## 2018-05-31 DIAGNOSIS — F331 Major depressive disorder, recurrent, moderate: Secondary | ICD-10-CM | POA: Diagnosis not present

## 2018-05-31 DIAGNOSIS — L03011 Cellulitis of right finger: Secondary | ICD-10-CM | POA: Diagnosis not present

## 2018-05-31 DIAGNOSIS — E039 Hypothyroidism, unspecified: Secondary | ICD-10-CM | POA: Diagnosis not present

## 2018-06-03 DIAGNOSIS — F331 Major depressive disorder, recurrent, moderate: Secondary | ICD-10-CM | POA: Diagnosis not present

## 2018-06-03 DIAGNOSIS — F411 Generalized anxiety disorder: Secondary | ICD-10-CM | POA: Diagnosis not present

## 2018-06-10 DIAGNOSIS — F411 Generalized anxiety disorder: Secondary | ICD-10-CM | POA: Diagnosis not present

## 2018-06-10 DIAGNOSIS — F331 Major depressive disorder, recurrent, moderate: Secondary | ICD-10-CM | POA: Diagnosis not present

## 2018-06-14 DIAGNOSIS — F411 Generalized anxiety disorder: Secondary | ICD-10-CM | POA: Diagnosis not present

## 2018-06-14 DIAGNOSIS — F331 Major depressive disorder, recurrent, moderate: Secondary | ICD-10-CM | POA: Diagnosis not present

## 2018-06-15 DIAGNOSIS — F331 Major depressive disorder, recurrent, moderate: Secondary | ICD-10-CM | POA: Diagnosis not present

## 2018-06-15 DIAGNOSIS — F4310 Post-traumatic stress disorder, unspecified: Secondary | ICD-10-CM | POA: Diagnosis not present

## 2018-06-15 DIAGNOSIS — F411 Generalized anxiety disorder: Secondary | ICD-10-CM | POA: Diagnosis not present

## 2018-06-17 DIAGNOSIS — F331 Major depressive disorder, recurrent, moderate: Secondary | ICD-10-CM | POA: Diagnosis not present

## 2018-06-17 DIAGNOSIS — F411 Generalized anxiety disorder: Secondary | ICD-10-CM | POA: Diagnosis not present

## 2018-06-21 ENCOUNTER — Encounter (HOSPITAL_COMMUNITY): Payer: Self-pay | Admitting: Family

## 2018-06-21 ENCOUNTER — Other Ambulatory Visit (HOSPITAL_COMMUNITY): Payer: BLUE CROSS/BLUE SHIELD | Attending: Psychiatry | Admitting: Licensed Clinical Social Worker

## 2018-06-21 DIAGNOSIS — Z79899 Other long term (current) drug therapy: Secondary | ICD-10-CM | POA: Diagnosis not present

## 2018-06-21 DIAGNOSIS — E039 Hypothyroidism, unspecified: Secondary | ICD-10-CM | POA: Insufficient documentation

## 2018-06-21 DIAGNOSIS — Z79890 Hormone replacement therapy (postmenopausal): Secondary | ICD-10-CM | POA: Insufficient documentation

## 2018-06-21 DIAGNOSIS — G4700 Insomnia, unspecified: Secondary | ICD-10-CM | POA: Diagnosis not present

## 2018-06-21 DIAGNOSIS — M1990 Unspecified osteoarthritis, unspecified site: Secondary | ICD-10-CM | POA: Insufficient documentation

## 2018-06-21 DIAGNOSIS — F411 Generalized anxiety disorder: Secondary | ICD-10-CM | POA: Insufficient documentation

## 2018-06-21 DIAGNOSIS — F329 Major depressive disorder, single episode, unspecified: Secondary | ICD-10-CM | POA: Diagnosis not present

## 2018-06-21 DIAGNOSIS — F320 Major depressive disorder, single episode, mild: Secondary | ICD-10-CM | POA: Insufficient documentation

## 2018-06-21 DIAGNOSIS — M810 Age-related osteoporosis without current pathological fracture: Secondary | ICD-10-CM | POA: Diagnosis not present
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
## 2018-06-22 ENCOUNTER — Other Ambulatory Visit (HOSPITAL_COMMUNITY): Payer: BLUE CROSS/BLUE SHIELD | Admitting: Licensed Clinical Social Worker

## 2018-06-22 DIAGNOSIS — Z79899 Other long term (current) drug therapy: Secondary | ICD-10-CM | POA: Diagnosis not present

## 2018-06-22 DIAGNOSIS — F411 Generalized anxiety disorder: Secondary | ICD-10-CM

## 2018-06-22 DIAGNOSIS — M1990 Unspecified osteoarthritis, unspecified site: Secondary | ICD-10-CM | POA: Diagnosis not present

## 2018-06-22 DIAGNOSIS — E039 Hypothyroidism, unspecified: Secondary | ICD-10-CM | POA: Diagnosis not present

## 2018-06-22 DIAGNOSIS — F320 Major depressive disorder, single episode, mild: Secondary | ICD-10-CM

## 2018-06-22 DIAGNOSIS — Z79890 Hormone replacement therapy (postmenopausal): Secondary | ICD-10-CM | POA: Diagnosis not present

## 2018-06-22 DIAGNOSIS — G4700 Insomnia, unspecified: Secondary | ICD-10-CM | POA: Diagnosis not present

## 2018-06-22 DIAGNOSIS — M810 Age-related osteoporosis without current pathological fracture: Secondary | ICD-10-CM | POA: Diagnosis not present

## 2018-06-22 DIAGNOSIS — F329 Major depressive disorder, single episode, unspecified: Secondary | ICD-10-CM | POA: Diagnosis not present

## 2018-06-22 NOTE — Progress Notes (Signed)
    Daily Group Progress Note  Program: IOP  Group Time: 9am-12pm  Participation Level: Active  Behavioral Response: Appropriate  Type of Therapy:  Group Therapy  Summary of Progress: Clinician checked in with group members, assessing for SI/HI/psychosis. Clinician inquired about a positive event or interaction which happened over the long weekend.  Clinician presented Values Clarification activity. Clinician and group members discussed priorities and decision making. Clinician encouraged clients to reflect on the priorities from today and values discussed last week and facilitated discussion on personal values factoring into decision making.  Clinician facilitated discussion following up from last weeks 'Inner Critic' conversation. Clinician presented 'Mindfulness Breathing Guided Meditation' to practice mindfulness with a focus on positive self talk. Clinician facilitated discussion on thoughts and feelings related to listening to positive self talk. Clinician presented 'My Strengths and Qualities' activity, focused on 'silencing inner critic' with acknowledging positive activities/traits in daily living. Clinician facilitated discussion on identifying and acknowledging Positive Experiences in which client has exhibited a positive quality, including courage, kindness, selflessness, love, sacrifice, wisdom, happiness, and determination. Clinician and group members discussed thoughts and feelings related to identifying and giving credit for positive experiences both small and large. Clinician encouraged clients to use the traits worksheet to help them identify moments in their lives which can remain as positive experiences of displays of positive qualities. Clinician and group members discussed events which could cause self doubt, or reasons to not give selves credit when deserved.  Chaplin presented to group to discuss the topic of grief/loss. Discussion topics included "Understanding losses,  identifying your losses and the feelings that you are experiencing." For the group these included loss of freedom, sense of self, loss of expected or idealized outcomes. The chaplain discussed the importance of why we grieve and the process of grieving for healing to take place.  The patient is new to group and shared that she's trying to adjust to her sons becoming more independent and needing her less.. A positive thing that happened is that her dog came home from the vet this weekend. Client became tearful during mindfulness activity, stating Its' difficult for her to see herself in a positive light. Client's grief and loss topic is related to finding herself in an identity that is not solely mother/caretaker.  Harlon Ditty, LCSW
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
## 2018-06-23 ENCOUNTER — Other Ambulatory Visit (HOSPITAL_COMMUNITY): Payer: BLUE CROSS/BLUE SHIELD | Admitting: Licensed Clinical Social Worker

## 2018-06-23 DIAGNOSIS — G4700 Insomnia, unspecified: Secondary | ICD-10-CM | POA: Diagnosis not present

## 2018-06-23 DIAGNOSIS — F329 Major depressive disorder, single episode, unspecified: Secondary | ICD-10-CM | POA: Diagnosis not present

## 2018-06-23 DIAGNOSIS — E039 Hypothyroidism, unspecified: Secondary | ICD-10-CM | POA: Diagnosis not present

## 2018-06-23 DIAGNOSIS — Z79890 Hormone replacement therapy (postmenopausal): Secondary | ICD-10-CM | POA: Diagnosis not present

## 2018-06-23 DIAGNOSIS — F320 Major depressive disorder, single episode, mild: Secondary | ICD-10-CM | POA: Diagnosis not present

## 2018-06-23 DIAGNOSIS — Z79899 Other long term (current) drug therapy: Secondary | ICD-10-CM | POA: Diagnosis not present

## 2018-06-23 DIAGNOSIS — M810 Age-related osteoporosis without current pathological fracture: Secondary | ICD-10-CM | POA: Diagnosis not present

## 2018-06-23 DIAGNOSIS — M1990 Unspecified osteoarthritis, unspecified site: Secondary | ICD-10-CM | POA: Diagnosis not present

## 2018-06-23 DIAGNOSIS — F411 Generalized anxiety disorder: Secondary | ICD-10-CM

## 2018-06-23 NOTE — Progress Notes (Signed)
    Daily Group Progress Note  Program: IOP  Group Time: 9am-12pm  Participation Level: Active  Behavioral Response: Appropriate  Type of Therapy:  Group Therapy  Summary of Progress: Clinician checked in with group, assessing for SI/HI/psychosis.  Clinician presented the topic of Vulnerability. Clinician provided Video by Dewain Penning. Clinician facilitated discussion with group members related to the topic of vulnerability. Clinician inquired about thoughts and feelings related to topic. Clinician and group members discussed pros of showing vulnerability and barriers to opening up.  Clinician presented DBT Distress Tolerance Skills. Clinician and group members review Distraction skill with ACCEPTS, Self-Soothe with Senses, and Radical Acceptance. Clinician and group members practiced 5-4-3-2-1 mindfulness senses skill as well as square breathing which several group members previously found helpful.  Clinician and group members discussed the importance with the ACCEPT skill to be mindful of only using distraction skills to lessen intensity of emotion, not numb the emotion and return to isolating. Clinician and group members discussed helpful community activities for hobbies and volunteer opportunities. Client reports tailoring her bedtime routine to her insomnia. Client was receptive to interventions and participated fully in all conversations. Client has made progress toward her goals of managing symptoms of anxiety and depression as evidence by participating in group and verbalizing use of coping skills outside of the group setting. Client verbalized some worry about her son being sick but was happy she would be able to take care of him.  Harlon Ditty, LCSW

## 2018-06-24 ENCOUNTER — Other Ambulatory Visit (HOSPITAL_COMMUNITY): Payer: BLUE CROSS/BLUE SHIELD | Admitting: Licensed Clinical Social Worker

## 2018-06-24 DIAGNOSIS — F320 Major depressive disorder, single episode, mild: Secondary | ICD-10-CM | POA: Diagnosis not present

## 2018-06-24 DIAGNOSIS — Z79899 Other long term (current) drug therapy: Secondary | ICD-10-CM | POA: Diagnosis not present

## 2018-06-24 DIAGNOSIS — M810 Age-related osteoporosis without current pathological fracture: Secondary | ICD-10-CM | POA: Diagnosis not present

## 2018-06-24 DIAGNOSIS — G4700 Insomnia, unspecified: Secondary | ICD-10-CM | POA: Diagnosis not present

## 2018-06-24 DIAGNOSIS — E039 Hypothyroidism, unspecified: Secondary | ICD-10-CM | POA: Diagnosis not present

## 2018-06-24 DIAGNOSIS — M1990 Unspecified osteoarthritis, unspecified site: Secondary | ICD-10-CM | POA: Diagnosis not present

## 2018-06-24 DIAGNOSIS — Z79890 Hormone replacement therapy (postmenopausal): Secondary | ICD-10-CM | POA: Diagnosis not present

## 2018-06-24 DIAGNOSIS — F331 Major depressive disorder, recurrent, moderate: Secondary | ICD-10-CM | POA: Diagnosis not present

## 2018-06-24 DIAGNOSIS — F411 Generalized anxiety disorder: Secondary | ICD-10-CM | POA: Diagnosis not present

## 2018-06-24 DIAGNOSIS — F329 Major depressive disorder, single episode, unspecified: Secondary | ICD-10-CM | POA: Diagnosis not present

## 2018-06-24 NOTE — Progress Notes (Signed)
    Daily Group Progress Note  Program: IOP  Group Time: 9am-12pm  Participation Level: Active  Behavioral Response: Appropriate  Type of Therapy:  Group Therapy  Summary of Progress:  9am-10am Clinician checked in with group, assessing for SI/HI/psychosis. Clinician inquired about completed self care activities from previous day. Clinician presented the topic of Positive Self Talk, discussing the importance of balancing unhelpful thoughts with helpful, or realistic thoughts to manage intensity of mood symptoms. Clinician presented the activity of 'Positive Self Talk Shield.' Clinician and group members processed the importance of positive self talk in developing sense of self which includes self-esteem, identity and initiative.  10am-11am Review of CBT Triangle. Clinician and group members discussed the connection of thoughts, feelings, and behaviors. Clinician and group members discussed the importance of identifying specific thoughts and feelings in order to create ore helpful thoughts or feelings with coping skills, in order to improve level of functioning and decrease symptoms of anxiety and depression. Clinician and group members processed some of the barriers to easily identifying thoughts and feelings and discussed barriers of implementing coping skills in the moment. Clinician discussed with group members the importance of practicing skills in group, while in a safe calm setting, in order to gain mastery of skills and help make them easier to implement in times of increase stress.  Clinician utilized activity 'Totika' to provide an opportunity for clients to utilize anxiety management skills, as well as continue conversations related to self esteem. Clinician challenged group members to identify specific thoughts and feelings related to behaviors observed in group. Clinician prompted group members to utilize previously learned skills when becoming visibly upset. Clinician facilitated group  on square breathing skill, previously learned in group. Clinician praised group member attempts at using skills to de-escalate the intensity of a feeling. Clinician also praised group members for willingness to participate and be open to challenges.  11am-12pm Co-facilitator presented the topic of mindfulness and facilitated the activity of yoga and meditation.  Client presented sharing with the group she enjoyed being able to help take care of her son. Her shield included believing she is 100% real, wonderful, and bright. Client reports the biggest waste of her time she believes is worrying about worrying. Client was receptive to feedback and participated in all conversations and activities.  Harlon Ditty, LCSW

## 2018-06-27 ENCOUNTER — Other Ambulatory Visit (HOSPITAL_COMMUNITY): Payer: BLUE CROSS/BLUE SHIELD | Admitting: Licensed Clinical Social Worker

## 2018-06-27 DIAGNOSIS — F320 Major depressive disorder, single episode, mild: Secondary | ICD-10-CM | POA: Diagnosis not present

## 2018-06-27 DIAGNOSIS — F411 Generalized anxiety disorder: Secondary | ICD-10-CM | POA: Diagnosis not present

## 2018-06-27 DIAGNOSIS — E039 Hypothyroidism, unspecified: Secondary | ICD-10-CM | POA: Diagnosis not present

## 2018-06-27 DIAGNOSIS — Z79890 Hormone replacement therapy (postmenopausal): Secondary | ICD-10-CM | POA: Diagnosis not present

## 2018-06-27 DIAGNOSIS — G4700 Insomnia, unspecified: Secondary | ICD-10-CM | POA: Diagnosis not present

## 2018-06-27 DIAGNOSIS — M810 Age-related osteoporosis without current pathological fracture: Secondary | ICD-10-CM | POA: Diagnosis not present

## 2018-06-27 DIAGNOSIS — M1990 Unspecified osteoarthritis, unspecified site: Secondary | ICD-10-CM | POA: Diagnosis not present

## 2018-06-27 DIAGNOSIS — F329 Major depressive disorder, single episode, unspecified: Secondary | ICD-10-CM | POA: Diagnosis not present

## 2018-06-27 DIAGNOSIS — Z79899 Other long term (current) drug therapy: Secondary | ICD-10-CM | POA: Diagnosis not present

## 2018-06-27 NOTE — Progress Notes (Signed)
    Daily Group Progress Note  Program: IOP  Group Time: 9am-12 pm  Participation Level: Active  Behavioral Response: Appropriate  Type of Therapy:  Group Therapy; psycho-educational group; process group  Summary of Progress:  9am-10am Clinician checked in with group members, assessing for SI/HI/psychosis and level of functioning. Clinician inquired about goals met over the weekend and any skills attempted. Clinician praised members for progress.  Pharmacist facilitated discussion for first part of the group. Pharmacist provided education about medication and side effects. Pharmacist allowed time for clients to ask questions about medications and side effects.  10am - 12pm Clinician presented the topic of Co-dependency Characteristics. Clinician described co-dependency and how this affects communication styles and relationships with ourselves and others. Clinician requested group members complete Codependency Characteristics Scale to identify traits of Caretaking, Self-Worth and Dependency. Clinician and group members processed how this can currently been seen in their daily lives.  Clinician facilitated discussion with group members about levels of attachment, family dysfunction and self worth, dependency and boundary needs, communication skills, and responsibilities. Clinician and group members processed thoughts and feelings related to these behaviors, and desires to change as well as boundaries to changing.  Clinician challeneged each group member to create one moment of joy over the rest of the day.  Client checks in reporting she did not complete her planned activity over the weekend due to her son still being sick. Client reports she is worried because he is not getting better but has a plan to take him to the doctor this afternoon. Client reports higher total scores for care taking and self worth, but was surprised how low her ratings were overall. Client notes changing several behaviors  since divorce with her husband in 2008 but continues to struggle with self worth. Client reports struggling with being single in a world that feels 'couples based." Client reported being afraid of making mistakes due to loosing progress made and the energy it takes to start over.   Olegario Messier, LCSW

## 2018-06-27 NOTE — Progress Notes (Signed)
    Daily Group Progress Note  Program: IOP  Group Time: 9am-12pm  Participation Level: Active  Behavioral Response: Appropriate  Type of Therapy:  Group Therapy  Summary of Progress:  Clinician checked in with group members, assessing for SI/HI/psychosis and overall level of functioning. Clinician and group members began group with discussions from 'Self-Reflection' cards, prompting a look a progress throughout treatment and continued needs. Clients crated SMART goals. Clinician and group members processed the importance of setting short term and long term goals and planning steps to meet goals and monitor progress.  Clinician presented topic of DBT Emotional Regulation Skills including Opposite Action, Check the Facts, P.L.E.A.S.E., and paying attention to positive events. Clinician and group members walked through each skill and practiced in group Clinician and group members proceed the importance and the struggles of self care. Clinician and group members discussed changes in routines that might trigger and increase in depressive symptoms.  Clinician presented the topic of "Communication RoadBlocks" through [facility] Brief Interventions. Clinician and group members processed common struggles with communication including We Assume People Know What We're Talking About, We Assume People Know What We're Feeling, We Don't Listen Very Well, We Overreact Sometimes to What Other People Say, and We Are Not Always Clear About Saying "No." Clinician and group members processed frustrations with trouble with communication and how unhealthy communication affects our mental health. Clinician and group members processed how setting boundaries with others related to healthy communication and effect relationships. Client participated in all group activities and discussions. Client has a goal of increasing socialization with friends as she previously reports isolating is a sign of increasing mental health symptoms.  Client made plans to have lunch with friends and check ins over the next 2 months. Client reports her self care activity over the weekend is to organize her office. Client notes this is more of a need than an enjoyable activity. Client lead the discussion on sometimes having to do tasks when they need to be done, rather than waiting for motivation.   Harlon Ditty, LCSW

## 2018-06-28 ENCOUNTER — Other Ambulatory Visit (HOSPITAL_COMMUNITY): Payer: BLUE CROSS/BLUE SHIELD | Admitting: Licensed Clinical Social Worker

## 2018-06-28 ENCOUNTER — Telehealth (HOSPITAL_COMMUNITY): Payer: Self-pay | Admitting: Psychiatry

## 2018-06-28 DIAGNOSIS — F411 Generalized anxiety disorder: Secondary | ICD-10-CM | POA: Diagnosis not present

## 2018-06-28 DIAGNOSIS — F320 Major depressive disorder, single episode, mild: Secondary | ICD-10-CM | POA: Diagnosis not present

## 2018-06-28 DIAGNOSIS — M1990 Unspecified osteoarthritis, unspecified site: Secondary | ICD-10-CM | POA: Diagnosis not present

## 2018-06-28 DIAGNOSIS — G4700 Insomnia, unspecified: Secondary | ICD-10-CM | POA: Diagnosis not present

## 2018-06-28 DIAGNOSIS — F329 Major depressive disorder, single episode, unspecified: Secondary | ICD-10-CM | POA: Diagnosis not present

## 2018-06-28 DIAGNOSIS — Z79899 Other long term (current) drug therapy: Secondary | ICD-10-CM | POA: Diagnosis not present

## 2018-06-28 DIAGNOSIS — M810 Age-related osteoporosis without current pathological fracture: Secondary | ICD-10-CM | POA: Diagnosis not present

## 2018-06-28 DIAGNOSIS — Z23 Encounter for immunization: Secondary | ICD-10-CM | POA: Diagnosis not present

## 2018-06-28 DIAGNOSIS — S61219A Laceration without foreign body of unspecified finger without damage to nail, initial encounter: Secondary | ICD-10-CM | POA: Diagnosis not present

## 2018-06-28 DIAGNOSIS — Z79890 Hormone replacement therapy (postmenopausal): Secondary | ICD-10-CM | POA: Diagnosis not present

## 2018-06-28 DIAGNOSIS — E039 Hypothyroidism, unspecified: Secondary | ICD-10-CM | POA: Diagnosis not present

## 2018-06-28 DIAGNOSIS — F331 Major depressive disorder, recurrent, moderate: Secondary | ICD-10-CM | POA: Diagnosis not present

## 2018-06-28 NOTE — Telephone Encounter (Signed)
D:  Pt requesting to meet with Peggye Fothergill, Pharm D to discuss her medications.  A:  Placed call to [place] and she stated she will come over to meet with pt today.  R:  Pt receptive.

## 2018-06-29 ENCOUNTER — Other Ambulatory Visit (HOSPITAL_COMMUNITY): Payer: BLUE CROSS/BLUE SHIELD | Admitting: Family

## 2018-06-29 DIAGNOSIS — F320 Major depressive disorder, single episode, mild: Secondary | ICD-10-CM | POA: Diagnosis not present

## 2018-06-29 DIAGNOSIS — M1990 Unspecified osteoarthritis, unspecified site: Secondary | ICD-10-CM | POA: Diagnosis not present

## 2018-06-29 DIAGNOSIS — Z79890 Hormone replacement therapy (postmenopausal): Secondary | ICD-10-CM | POA: Diagnosis not present

## 2018-06-29 DIAGNOSIS — M810 Age-related osteoporosis without current pathological fracture: Secondary | ICD-10-CM | POA: Diagnosis not present

## 2018-06-29 DIAGNOSIS — E039 Hypothyroidism, unspecified: Secondary | ICD-10-CM | POA: Diagnosis not present

## 2018-06-29 DIAGNOSIS — F411 Generalized anxiety disorder: Secondary | ICD-10-CM | POA: Diagnosis not present

## 2018-06-29 DIAGNOSIS — F329 Major depressive disorder, single episode, unspecified: Secondary | ICD-10-CM | POA: Diagnosis not present

## 2018-06-29 DIAGNOSIS — F321 Major depressive disorder, single episode, moderate: Secondary | ICD-10-CM

## 2018-06-29 DIAGNOSIS — Z79899 Other long term (current) drug therapy: Secondary | ICD-10-CM | POA: Diagnosis not present

## 2018-06-29 DIAGNOSIS — G4700 Insomnia, unspecified: Secondary | ICD-10-CM | POA: Diagnosis not present

## 2018-06-29 NOTE — Progress Notes (Signed)
    Daily Group Progress Note  Program: IOP  Group Time: 9am-12pm  Participation Level: Active  Behavioral Response: Appropriate  Type of Therapy:  Group Therapy  Summary of Progress: 9am-11am Clinician provided video and facilitated follow up discussion on 'Unconditional positive regard--the power of self acceptance." Clinician and group members discussed self forgiveness and reviewed DBT skill Turning the Mind. Clinician presented the topic of 'Growth vs. Fixed Mindset." Clinician facilitated discussion with group members on what Fixed and Growth mindset were. Clinician provided clients with psycho-educational handout on The Mindset Continuum and processed where clients landed in several categories. Clinician and group members reviewed '25 Ways to Build a Growth Mindset' and discussed how some of the skills can be incorporated into daily life. Clinician prompted group to practice changing Fixed Mindset phrases into Growth Mindset phrases. Clinician praised group members for discussion and encouragement of other group members.  11am-12pm Chaplin facilitated discussion on grief and loss. Chaplin inquired about types of losses felt by group members and how they grieve. Chaplin and group members discussed how the way they saw grieving handled growing up effected how they grieve now. Client presented fully oriented with mood and affect within normal limits. Client reports not wanting to discuss topic related to grief and loss due to not wanting to bring up mobility concerns again. Client did participate in other discussions and group activities, verbalizing the importance of self compassion as well as some of the barriers.    Harlon Ditty, LCSW

## 2018-06-29 NOTE — Progress Notes (Signed)
Carla [name] is a 57 y.o. female who called stating that she just got off the phone with her providers [name], NP and Areta Haber, APRN) who recommends her be admitted into a psychiatric facility to be weaned off the valium.  Pt was very tearful.  "I just can't put up with this dizziness any longer and if that's what I need to do I will."  According to pt, she plans to check herself into a hospital in [place] (one she was previously at).  Pt states this will be her last week in [facility].  Reports that she has learned a lot and found it to be very beneficial.  Pt states she will let writer know if she will return tomorrow or Friday. Pt denied any SI/HI or A/V hallucinations.   A:  Provided pt with support.  Informed pt that she is welcome to return to [facility] after she is released from the facility in [place].  Inform treatment team.  R:  Pt receptive.        Chestine Spore, RITA, M.Ed,CNA

## 2018-06-29 NOTE — Progress Notes (Signed)
    Daily Group Progress Note  Program: IOP  Group Time: 9am-12pm Participation Level: Active  Behavioral Response: Appropriate  Type of Therapy:  Group Therapy  Summary of Progress: The theme for group was "Positive Self Image and Self Esteem." The therapist facilitated the session by reading a positive Self-Image and Self-Esteem article to group and defined self-image and self-esteem to engage the group into discussion. The therapist proceeded to discussing the various perceptions of self-image and self-esteem with group members. Members shared their perceptions of their own self-image and positive attributes that they have accomplished over the years. They were able to identify, reflect, and acknowledge the positive things about themselves during the psychoeducational activity throughout the session.  The therapist emphasized on the importance of one's self-image to be positive and realistic to balance changes, growth and success. Sometimes having an overly positive image of oneself can result in complacency, arrogance or underachievement. The group was actively engaged and shared their commonalities with positive affirmations to support one another.  The patient shared that she's a great mom. She has made good use of her time and raising her sons. She enjoyed life and left her job to take care of her sons.  The patient was active in group and her behavioral response was appropriate. Client requested and received consult with pharmacist.   Harlon Ditty, LCSW

## 2018-06-30 ENCOUNTER — Other Ambulatory Visit (HOSPITAL_COMMUNITY): Payer: BLUE CROSS/BLUE SHIELD

## 2018-07-01 ENCOUNTER — Other Ambulatory Visit (HOSPITAL_COMMUNITY): Payer: BLUE CROSS/BLUE SHIELD | Admitting: Psychiatry

## 2018-07-01 NOTE — Progress Notes (Signed)
Prudencio PairDorothy W Dorris is a 57 y.o. , unemployed, divorced, Caucasian female who was referred per her therapist and NP Jimmie Molly(Bonnie Kenndedy, APRN and Deatra RobinsonKaren Jones, NP); treatment for worsening depressive and anxiety symptoms.  Pt denied SI/HI or A/V hallucinations.  According to pt in Feb. 2019, she requested to change from Remeron to Trazodone d/t increased wt gain.  "I started becoming more anxious, started losing wt and becoming dizzy."  Pt states she has lost 40lbs since March 2019.  Reports one prior psych [place] in Sept. 2015 d/t severe anxiety/depression with SI.  Pt denies any prior suicide attempts or gestures.  Has been seeing Areta HaberBonnie Kennedy, APRN and Deatra RobinsonKaren Jones, NP since 2016.  Hx of seeing Drs. Evelene CroonKaur and Paul SmithsMcKinney.  Family Hx:  Deceased Father (Bipolar D/O; depressed and ETOH).  Stressors:  1)  Impending empty nest issues:  Son is currently a senior in high school and other son is a Holiday representativejunior.  "I just don't know what I'm going to do when they both leave home."  2)  No structure.  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 Pt requesting discharge as per stated in previous telephone note.  Pt stated, she along with her team Clydie Braun(Karen and Kendal HymenBonnie) decided that she would check herself into a psychiatric facility in order to be weaned off the Valium.  CC: previous telephone encounter.  A:  D/C today.  Transition to inpatient facility of her choice [facility]([place]).  Informed pt that she could come back to [facility] to continue in one of our group settings.  R:  Pt receptive.                                                                         Chestine SporeLARK, RITA, M.Ed,CNA

## 2018-07-01 NOTE — Patient Instructions (Signed)
D:  Pt requested discharge; stating that she would be checking herself into an inpt psychiatric facility in [place].  A:  Discharge from MH[facility]IOP today.  R:  Pt receptive.

## 2018-07-04 ENCOUNTER — Encounter (HOSPITAL_COMMUNITY): Payer: Self-pay | Admitting: Neurology

## 2018-07-04 ENCOUNTER — Emergency Department (HOSPITAL_COMMUNITY): Payer: BLUE CROSS/BLUE SHIELD

## 2018-07-04 ENCOUNTER — Other Ambulatory Visit (HOSPITAL_COMMUNITY): Payer: BLUE CROSS/BLUE SHIELD

## 2018-07-04 ENCOUNTER — Inpatient Hospital Stay (HOSPITAL_COMMUNITY)
Admission: EM | Admit: 2018-07-04 | Discharge: 2018-07-06 | DRG: 579 | Disposition: A | Discharge status: Other Acute Inpatient Hospital | Payer: BLUE CROSS/BLUE SHIELD | Attending: General Surgery | Admitting: General Surgery

## 2018-07-04 DIAGNOSIS — Z885 Allergy status to narcotic agent status: Secondary | ICD-10-CM

## 2018-07-04 DIAGNOSIS — T402X2A Poisoning by other opioids, intentional self-harm, initial encounter: Secondary | ICD-10-CM | POA: Diagnosis not present

## 2018-07-04 DIAGNOSIS — D62 Acute posthemorrhagic anemia: Secondary | ICD-10-CM | POA: Diagnosis not present

## 2018-07-04 DIAGNOSIS — S1193XA Puncture wound without foreign body of unspecified part of neck, initial encounter: Secondary | ICD-10-CM | POA: Diagnosis not present

## 2018-07-04 DIAGNOSIS — T391X2A Poisoning by 4-Aminophenol derivatives, intentional self-harm, initial encounter: Secondary | ICD-10-CM | POA: Diagnosis not present

## 2018-07-04 DIAGNOSIS — F4310 Post-traumatic stress disorder, unspecified: Secondary | ICD-10-CM | POA: Diagnosis not present

## 2018-07-04 DIAGNOSIS — G4700 Insomnia, unspecified: Secondary | ICD-10-CM | POA: Diagnosis present

## 2018-07-04 DIAGNOSIS — Z79899 Other long term (current) drug therapy: Secondary | ICD-10-CM

## 2018-07-04 DIAGNOSIS — X789XXA Intentional self-harm by unspecified sharp object, initial encounter: Secondary | ICD-10-CM | POA: Diagnosis present

## 2018-07-04 DIAGNOSIS — T50902A Poisoning by unspecified drugs, medicaments and biological substances, intentional self-harm, initial encounter: Secondary | ICD-10-CM

## 2018-07-04 DIAGNOSIS — F419 Anxiety disorder, unspecified: Secondary | ICD-10-CM | POA: Diagnosis present

## 2018-07-04 DIAGNOSIS — Z881 Allergy status to other antibiotic agents status: Secondary | ICD-10-CM

## 2018-07-04 DIAGNOSIS — R404 Transient alteration of awareness: Secondary | ICD-10-CM | POA: Diagnosis not present

## 2018-07-04 DIAGNOSIS — S15211A Minor laceration of right external jugular vein, initial encounter: Secondary | ICD-10-CM | POA: Diagnosis present

## 2018-07-04 DIAGNOSIS — F332 Major depressive disorder, recurrent severe without psychotic features: Secondary | ICD-10-CM | POA: Diagnosis present

## 2018-07-04 DIAGNOSIS — E861 Hypovolemia: Secondary | ICD-10-CM | POA: Diagnosis not present

## 2018-07-04 DIAGNOSIS — S1191XA Laceration without foreign body of unspecified part of neck, initial encounter: Secondary | ICD-10-CM

## 2018-07-04 DIAGNOSIS — T424X2A Poisoning by benzodiazepines, intentional self-harm, initial encounter: Secondary | ICD-10-CM | POA: Diagnosis not present

## 2018-07-04 DIAGNOSIS — T794XXA Traumatic shock, initial encounter: Secondary | ICD-10-CM | POA: Diagnosis not present

## 2018-07-04 DIAGNOSIS — F607 Dependent personality disorder: Secondary | ICD-10-CM | POA: Diagnosis not present

## 2018-07-04 DIAGNOSIS — E872 Acidosis: Secondary | ICD-10-CM | POA: Diagnosis present

## 2018-07-04 DIAGNOSIS — S21112A Laceration without foreign body of left front wall of thorax without penetration into thoracic cavity, initial encounter: Secondary | ICD-10-CM | POA: Diagnosis not present

## 2018-07-04 DIAGNOSIS — S1181XA Laceration without foreign body of other specified part of neck, initial encounter: Secondary | ICD-10-CM | POA: Diagnosis not present

## 2018-07-04 DIAGNOSIS — R0689 Other abnormalities of breathing: Secondary | ICD-10-CM | POA: Diagnosis not present

## 2018-07-04 DIAGNOSIS — R0902 Hypoxemia: Secondary | ICD-10-CM | POA: Diagnosis not present

## 2018-07-04 DIAGNOSIS — T1491XA Suicide attempt, initial encounter: Secondary | ICD-10-CM | POA: Diagnosis not present

## 2018-07-04 DIAGNOSIS — T50904A Poisoning by unspecified drugs, medicaments and biological substances, undetermined, initial encounter: Secondary | ICD-10-CM | POA: Diagnosis not present

## 2018-07-04 HISTORY — DX: Hypothyroidism, unspecified: E03.9

## 2018-07-04 LAB — I-STAT CHEM 8, ED
BUN: 15 mg/dL (ref 6–20)
BUN: 18 mg/dL (ref 6–20)
Calcium, Ion: 0.83 mmol/L — CL (ref 1.15–1.40)
Calcium, Ion: 0.95 mmol/L — ABNORMAL LOW (ref 1.15–1.40)
Chloride: 111 mmol/L (ref 98–111)
Chloride: 104 mmol/L (ref 98–111)
Creatinine, Ser: 0.9 mg/dL (ref 0.44–1.00)
Creatinine, Ser: 1 mg/dL (ref 0.44–1.00)
Glucose, Bld: 222 mg/dL — ABNORMAL HIGH (ref 70–99)
Glucose, Bld: 185 mg/dL — ABNORMAL HIGH (ref 70–99)
HCT: 17 % — ABNORMAL LOW (ref 36.0–46.0)
HCT: 34 % — ABNORMAL LOW (ref 36.0–46.0)
Hemoglobin: 5.8 g/dL — CL (ref 12.0–15.0)
Hemoglobin: 11.6 g/dL — ABNORMAL LOW (ref 12.0–15.0)
Potassium: 3.1 mmol/L — ABNORMAL LOW (ref 3.5–5.1)
Potassium: 4.6 mmol/L (ref 3.5–5.1)
Sodium: 141 mmol/L (ref 135–145)
Sodium: 139 mmol/L (ref 135–145)
TCO2: 16 mmol/L — ABNORMAL LOW (ref 22–32)
TCO2: 22 mmol/L (ref 22–32)

## 2018-07-04 LAB — COMPREHENSIVE METABOLIC PANEL
ALT: 28 U/L (ref 0–44)
AST: 36 U/L (ref 15–41)
Albumin: 2.4 g/dL — ABNORMAL LOW (ref 3.5–5.0)
Alkaline Phosphatase: 23 U/L — ABNORMAL LOW (ref 38–126)
Anion gap: 9 (ref 5–15)
BUN: 18 mg/dL (ref 6–20)
CO2: 20 mmol/L — ABNORMAL LOW (ref 22–32)
Calcium: 6.8 mg/dL — ABNORMAL LOW (ref 8.9–10.3)
Chloride: 111 mmol/L (ref 98–111)
Creatinine, Ser: 1.27 mg/dL — ABNORMAL HIGH (ref 0.44–1.00)
GFR calc Af Amer: 53 mL/min — ABNORMAL LOW (ref >60)
GFR calc non Af Amer: 46 mL/min — ABNORMAL LOW (ref >60)
Glucose, Bld: 274 mg/dL — ABNORMAL HIGH (ref 70–99)
Potassium: 4.1 mmol/L (ref 3.5–5.1)
Sodium: 140 mmol/L (ref 135–145)
Total Bilirubin: 0.8 mg/dL (ref 0.3–1.2)
Total Protein: 3.6 g/dL — ABNORMAL LOW (ref 6.5–8.1)

## 2018-07-04 LAB — I-STAT TROPONIN, ED: Troponin i, poc: 0.03 ng/mL (ref 0.00–0.08)

## 2018-07-04 LAB — RAPID URINE DRUG SCREEN, HOSP PERFORMED
Amphetamines: NOT DETECTED
Barbiturates: NOT DETECTED
Benzodiazepines: POSITIVE — AB
Cocaine: NOT DETECTED
Opiates: NOT DETECTED
Tetrahydrocannabinol: NOT DETECTED

## 2018-07-04 LAB — SALICYLATE LEVEL: Salicylate Lvl: <0.7 mg/dL — ABNORMAL LOW (ref 2.8–30.0)

## 2018-07-04 LAB — CBC
HCT: 26.3 % — ABNORMAL LOW (ref 36.0–46.0)
Hemoglobin: 8.4 g/dL — ABNORMAL LOW (ref 12.0–15.0)
MCH: 31.7 pg (ref 26.0–34.0)
MCHC: 31.9 g/dL (ref 30.0–36.0)
MCV: 99.2 fL (ref 78.0–100.0)
Platelets: 172 10*3/uL (ref 150–400)
RBC: 2.65 MIL/uL — ABNORMAL LOW (ref 3.87–5.11)
RDW: 13.8 % (ref 11.5–15.5)
WBC: 5.4 10*3/uL (ref 4.0–10.5)

## 2018-07-04 LAB — URINALYSIS, ROUTINE W REFLEX MICROSCOPIC
Bilirubin Urine: NEGATIVE
Glucose, UA: 150 mg/dL — AB
Ketones, ur: NEGATIVE mg/dL
Leukocytes, UA: NEGATIVE
Nitrite: NEGATIVE
Protein, ur: 100 mg/dL — AB
Specific Gravity, Urine: 1.013 (ref 1.005–1.030)
pH: 6 (ref 5.0–8.0)

## 2018-07-04 LAB — I-STAT CG4 LACTIC ACID, ED
Lactic Acid, Venous: 4.78 mmol/L (ref 0.5–1.9)
Lactic Acid, Venous: 6.77 mmol/L (ref 0.5–1.9)

## 2018-07-04 LAB — ABO/RH: ABO/RH(D): A POS

## 2018-07-04 LAB — ACETAMINOPHEN LEVEL: Acetaminophen (Tylenol), Serum: 32 ug/mL — ABNORMAL HIGH (ref 10–30)

## 2018-07-04 LAB — HEMOGLOBIN AND HEMATOCRIT, BLOOD
HCT: 38.6 % (ref 36.0–46.0)
HCT: 30.3 % — ABNORMAL LOW (ref 36.0–46.0)
Hemoglobin: 13 g/dL (ref 12.0–15.0)
Hemoglobin: 10.4 g/dL — ABNORMAL LOW (ref 12.0–15.0)

## 2018-07-04 LAB — MRSA PCR SCREENING: MRSA by PCR: NEGATIVE

## 2018-07-04 LAB — PROTIME-INR
INR: 1.45
Prothrombin Time: 17.5 seconds — ABNORMAL HIGH (ref 11.4–15.2)

## 2018-07-04 LAB — ETHANOL: Alcohol, Ethyl (B): <10 mg/dL (ref <10)

## 2018-07-04 MED ORDER — LIDOCAINE HCL (PF) 1 % IJ SOLN
10.0000 mL | Freq: Once | INTRAMUSCULAR | Status: AC
  Administered 2018-07-04: 10 mL
  Filled 2018-07-04: qty 10

## 2018-07-04 MED ORDER — KCL IN DEXTROSE-NACL 10-5-0.45 MEQ/L-%-% IV SOLN
INTRAVENOUS | Status: DC
  Administered 2018-07-04: 16:00:00 via INTRAVENOUS
  Filled 2018-07-04 (×2): qty 1000

## 2018-07-04 MED ORDER — DOCUSATE SODIUM 100 MG PO CAPS
100.0000 mg | ORAL_CAPSULE | Freq: Two times a day (BID) | ORAL | Status: DC
  Administered 2018-07-05: 100 mg via ORAL
  Filled 2018-07-04 (×2): qty 1

## 2018-07-04 MED ORDER — ONDANSETRON 4 MG PO TBDP: 4.0000 mg | ORAL_TABLET | Freq: Four times a day (QID) | ORAL | Status: DC | PRN

## 2018-07-04 MED ORDER — IOPAMIDOL (ISOVUE-370) INJECTION 76%
100.0000 mL | Freq: Once | INTRAVENOUS | Status: AC | PRN
  Administered 2018-07-04: 50 mL via INTRAVENOUS

## 2018-07-04 MED ORDER — HYDROMORPHONE HCL 1 MG/ML IJ SOLN: 0.5000 mg | INTRAMUSCULAR | Status: DC | PRN

## 2018-07-04 MED ORDER — ONDANSETRON HCL 4 MG/2ML IJ SOLN
4.0000 mg | Freq: Four times a day (QID) | INTRAMUSCULAR | Status: DC | PRN
  Administered 2018-07-04: 4 mg via INTRAVENOUS
  Filled 2018-07-04: qty 2

## 2018-07-04 MED ORDER — ACETYLCYSTEINE 20 % IN SOLN
140.0000 mg/kg | Freq: Once | RESPIRATORY_TRACT | Status: DC
  Filled 2018-07-04: qty 60

## 2018-07-04 MED ORDER — ACETYLCYSTEINE 20 % IN SOLN
70.0000 mg/kg | RESPIRATORY_TRACT | Status: DC
  Administered 2018-07-04 – 2018-07-05 (×4): 3900 mg via ORAL
  Filled 2018-07-04 (×6): qty 30

## 2018-07-04 MED ORDER — PANTOPRAZOLE SODIUM 40 MG PO TBEC
40.0000 mg | DELAYED_RELEASE_TABLET | Freq: Every day | ORAL | Status: DC
  Administered 2018-07-05 – 2018-07-06 (×2): 40 mg via ORAL
  Filled 2018-07-04 (×2): qty 1

## 2018-07-04 MED ORDER — OXYCODONE HCL 5 MG PO TABS: 5.0000 mg | ORAL_TABLET | ORAL | Status: DC | PRN

## 2018-07-04 MED ORDER — SODIUM CHLORIDE 0.9 % IV BOLUS
1000.0000 mL | Freq: Once | INTRAVENOUS | Status: AC
  Administered 2018-07-04: 1000 mL via INTRAVENOUS

## 2018-07-04 MED ORDER — TRAMADOL HCL 50 MG PO TABS: 50.0000 mg | ORAL_TABLET | Freq: Four times a day (QID) | ORAL | Status: DC | PRN
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
## 2018-07-04 NOTE — Progress Notes (Signed)
Orthopedic Tech Progress Note Patient Details:  Carla PairDorothy W [name] 05/17/1961 [id number]  Patient ID: Carla Pairorothy W [name], female   DOB: 11/26/1960, 57 y.o.   MRN: [id number]   Carla FordyceJennifer C Esaul [name] 07/04/2018, 11:51 AMLevel one Trauma.

## 2018-07-04 NOTE — ED Notes (Signed)
2nd unit FFP [id number], Volume 200.
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
## 2018-07-04 NOTE — ED Notes (Signed)
2nd unit ONEG, [id number] D, Volume 315.

## 2018-07-04 NOTE — ED Notes (Signed)
Unit # 3 RBCs [id number] R, Volume 258 CC.

## 2018-07-04 NOTE — ED Notes (Signed)
4th FFP [id number] D, Volume 203.

## 2018-07-04 NOTE — ED Notes (Addendum)
4th RBC [id number] W, Volume 315.
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
## 2018-07-04 NOTE — ED Notes (Signed)
Pt given emergency release blood per Dr. Janee Mornhompson UNIT # 1 O-NEG [id number], 311 CC.

## 2018-07-04 NOTE — ED Notes (Signed)
Family at bedside. 

## 2018-07-04 NOTE — Procedures (Addendum)
Laceration Repair  Procedure discussed and verbal consent for laceration repair obtained.  Location: Right neck Length: about 3.5 cm Complex somewhat stelate laceration Wound was cleansed with betadine and then local anesthetic 1% lidocaine injected to numb laceration. Draped in sterile fashion. Wound irrigated with copious sterile saline and cleansed a second time with betadine.  Laceration closed with #4 simple interrupted sutures with 5-0 prolene suture EBL: minimal Complications: none Wound covered with xeroform gauze and dry gauze dressing. Patient tolerated well.   Wells GuilesKelly Rayburn , [facility] 07/04/2018, 1:43 PM Pager: [phone] Mon-Fri 7:00 am-4:30 pm Sat-Sun 7:00 am-11:30 am

## 2018-07-04 NOTE — ED Notes (Signed)
1st FFP [id number] Z, 200 CC.
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
## 2018-07-04 NOTE — ED Notes (Signed)
3rd FFP [id number], Volume 198.

## 2018-07-05 ENCOUNTER — Encounter (HOSPITAL_COMMUNITY): Payer: Self-pay | Admitting: Family

## 2018-07-05 ENCOUNTER — Other Ambulatory Visit (HOSPITAL_COMMUNITY): Payer: BLUE CROSS/BLUE SHIELD

## 2018-07-05 DIAGNOSIS — T1491XA Suicide attempt, initial encounter: Secondary | ICD-10-CM

## 2018-07-05 DIAGNOSIS — G4700 Insomnia, unspecified: Secondary | ICD-10-CM

## 2018-07-05 DIAGNOSIS — T391X2A Poisoning by 4-Aminophenol derivatives, intentional self-harm, initial encounter: Secondary | ICD-10-CM

## 2018-07-05 DIAGNOSIS — F332 Major depressive disorder, recurrent severe without psychotic features: Secondary | ICD-10-CM

## 2018-07-05 DIAGNOSIS — S1191XA Laceration without foreign body of unspecified part of neck, initial encounter: Principal | ICD-10-CM

## 2018-07-05 DIAGNOSIS — T424X2A Poisoning by benzodiazepines, intentional self-harm, initial encounter: Secondary | ICD-10-CM

## 2018-07-05 DIAGNOSIS — F419 Anxiety disorder, unspecified: Secondary | ICD-10-CM

## 2018-07-05 LAB — PREPARE FRESH FROZEN PLASMA
Unit division: 0
Unit division: 0
Unit division: 0
Unit division: 0

## 2018-07-05 LAB — BPAM FFP
Blood Product Expiration Date: 201909172359
Blood Product Expiration Date: 201909172359
Blood Product Expiration Date: 201909202359
Blood Product Expiration Date: 201909202359
Blood Product Expiration Date: 201909202359
Blood Product Expiration Date: 201909202359
Blood Product Expiration Date: 201909212359
Blood Product Expiration Date: 201909212359
Blood Product Expiration Date: 201909212359
Blood Product Expiration Date: 201909212359
ISSUE DATE / TIME: 201909161135
ISSUE DATE / TIME: 201909161135
ISSUE DATE / TIME: 201909161149
ISSUE DATE / TIME: 201909161149
ISSUE DATE / TIME: 201909161206
ISSUE DATE / TIME: 201909161206
ISSUE DATE / TIME: 201909162236
ISSUE DATE / TIME: 201909162335
Unit Type and Rh: 600
Unit Type and Rh: 6200
Unit Type and Rh: 6200
Unit Type and Rh: 6200
Unit Type and Rh: 6200
Unit Type and Rh: 6200
Unit Type and Rh: 6200
Unit Type and Rh: 6200
Unit Type and Rh: 6200
Unit Type and Rh: 6200

## 2018-07-05 LAB — TYPE AND SCREEN
ABO/RH(D): A POS
Antibody Screen: NEGATIVE
Unit division: 0
Unit division: 0
Unit division: 0
Unit division: 0
Unit division: 0
Unit division: 0

## 2018-07-05 LAB — BPAM RBC
Blood Product Expiration Date: 201909232359
Blood Product Expiration Date: 201910112359
Blood Product Expiration Date: 201910112359
Blood Product Expiration Date: 201910172359
Blood Product Expiration Date: 201910192359
Blood Product Expiration Date: 201910192359
ISSUE DATE / TIME: 201909161135
ISSUE DATE / TIME: 201909161135
ISSUE DATE / TIME: 201909161148
ISSUE DATE / TIME: 201909161148
ISSUE DATE / TIME: 201909161748
ISSUE DATE / TIME: 201909170810
Unit Type and Rh: 5100
Unit Type and Rh: 5100
Unit Type and Rh: 9500
Unit Type and Rh: 9500
Unit Type and Rh: 9500
Unit Type and Rh: 9500

## 2018-07-05 LAB — CBC
HCT: 34.2 % — ABNORMAL LOW (ref 36.0–46.0)
Hemoglobin: 11.4 g/dL — ABNORMAL LOW (ref 12.0–15.0)
MCH: 29.8 pg (ref 26.0–34.0)
MCHC: 33.3 g/dL (ref 30.0–36.0)
MCV: 89.5 fL (ref 78.0–100.0)
Platelets: 88 10*3/uL — ABNORMAL LOW (ref 150–400)
RBC: 3.82 MIL/uL — ABNORMAL LOW (ref 3.87–5.11)
RDW: 15.9 % — ABNORMAL HIGH (ref 11.5–15.5)
WBC: 5.9 10*3/uL (ref 4.0–10.5)

## 2018-07-05 LAB — COMPREHENSIVE METABOLIC PANEL
ALT: 41 U/L (ref 0–44)
AST: 36 U/L (ref 15–41)
Albumin: 2.9 g/dL — ABNORMAL LOW (ref 3.5–5.0)
Alkaline Phosphatase: 30 U/L — ABNORMAL LOW (ref 38–126)
Anion gap: 7 (ref 5–15)
BUN: 7 mg/dL (ref 6–20)
CO2: 24 mmol/L (ref 22–32)
Calcium: 7.2 mg/dL — ABNORMAL LOW (ref 8.9–10.3)
Chloride: 113 mmol/L — ABNORMAL HIGH (ref 98–111)
Creatinine, Ser: 0.66 mg/dL (ref 0.44–1.00)
GFR calc Af Amer: >60 mL/min (ref >60)
GFR calc non Af Amer: >60 mL/min (ref >60)
Glucose, Bld: 100 mg/dL — ABNORMAL HIGH (ref 70–99)
Potassium: 3.5 mmol/L (ref 3.5–5.1)
Sodium: 144 mmol/L (ref 135–145)
Total Bilirubin: 0.7 mg/dL (ref 0.3–1.2)
Total Protein: 4.8 g/dL — ABNORMAL LOW (ref 6.5–8.1)

## 2018-07-05 LAB — HEMOGLOBIN AND HEMATOCRIT, BLOOD
HCT: 30.6 % — ABNORMAL LOW (ref 36.0–46.0)
Hemoglobin: 10.5 g/dL — ABNORMAL LOW (ref 12.0–15.0)

## 2018-07-05 LAB — LACTIC ACID, PLASMA: Lactic Acid, Venous: 1.2 mmol/L (ref 0.5–1.9)

## 2018-07-05 LAB — ACETAMINOPHEN LEVEL: Acetaminophen (Tylenol), Serum: <10 ug/mL — ABNORMAL LOW (ref 10–30)

## 2018-07-05 LAB — HIV ANTIBODY (ROUTINE TESTING W REFLEX): HIV Screen 4th Generation wRfx: NONREACTIVE

## 2018-07-05 LAB — CDS SEROLOGY

## 2018-07-05 MED ORDER — CALCIUM CARBONATE 1250 (500 CA) MG PO TABS
1.0000 | ORAL_TABLET | Freq: Two times a day (BID) | ORAL | Status: DC
  Administered 2018-07-05 – 2018-07-06 (×4): 500 mg via ORAL
  Filled 2018-07-05 (×5): qty 1

## 2018-07-05 MED ORDER — MAGNESIUM GLUCONATE 500 MG PO TABS
500.0000 mg | ORAL_TABLET | Freq: Two times a day (BID) | ORAL | Status: DC
  Administered 2018-07-05 – 2018-07-06 (×3): 500 mg via ORAL
  Filled 2018-07-05 (×4): qty 1

## 2018-07-05 MED ORDER — VITAMIN D 1000 UNITS PO TABS
1000.0000 [IU] | ORAL_TABLET | Freq: Every day | ORAL | Status: DC
  Administered 2018-07-05 – 2018-07-06 (×2): 1000 [IU] via ORAL
  Filled 2018-07-05 (×2): qty 1

## 2018-07-05 MED ORDER — CYCLOBENZAPRINE HCL 10 MG PO TABS: 10.0000 mg | ORAL_TABLET | Freq: Three times a day (TID) | ORAL | Status: DC | PRN

## 2018-07-05 MED ORDER — CEFAZOLIN SODIUM-DEXTROSE 1-4 GM/50ML-% IV SOLN
1.0000 g | Freq: Three times a day (TID) | INTRAVENOUS | Status: AC
  Administered 2018-07-05 (×3): 1 g via INTRAVENOUS
  Filled 2018-07-05 (×4): qty 50

## 2018-07-05 MED ORDER — DESVENLAFAXINE SUCCINATE ER 50 MG PO TB24
50.0000 mg | ORAL_TABLET | Freq: Every day | ORAL | Status: DC
  Administered 2018-07-05 – 2018-07-06 (×2): 50 mg via ORAL
  Filled 2018-07-05 (×2): qty 1

## 2018-07-05 MED ORDER — TRAZODONE HCL 50 MG PO TABS
50.0000 mg | ORAL_TABLET | Freq: Every day | ORAL | Status: DC
  Administered 2018-07-05: 50 mg via ORAL
  Filled 2018-07-05: qty 1

## 2018-07-05 MED ORDER — MIRTAZAPINE 15 MG PO TABS
15.0000 mg | ORAL_TABLET | Freq: Every day | ORAL | Status: DC
  Administered 2018-07-05: 15 mg via ORAL
  Filled 2018-07-05: qty 1

## 2018-07-05 MED ORDER — LEVOTHYROXINE SODIUM 125 MCG PO TABS
125.0000 ug | ORAL_TABLET | Freq: Every day | ORAL | Status: DC
  Administered 2018-07-05 – 2018-07-06 (×2): 125 ug via ORAL
  Filled 2018-07-05 (×3): qty 1

## 2018-07-05 MED ORDER — DIAZEPAM 5 MG PO TABS
10.0000 mg | ORAL_TABLET | Freq: Every day | ORAL | Status: DC
  Administered 2018-07-05: 10 mg via ORAL
  Filled 2018-07-05: qty 2

## 2018-07-05 MED ORDER — BREXPIPRAZOLE 1 MG PO TABS
1.0000 mg | ORAL_TABLET | Freq: Every day | ORAL | Status: DC
  Filled 2018-07-05 (×2): qty 1

## 2018-07-05 MED ORDER — LOPERAMIDE HCL 2 MG PO CAPS
4.0000 mg | ORAL_CAPSULE | Freq: Once | ORAL | Status: AC
  Administered 2018-07-05: 4 mg via ORAL
  Filled 2018-07-05: qty 2

## 2018-07-05 MED ORDER — GABAPENTIN 300 MG PO CAPS
600.0000 mg | ORAL_CAPSULE | Freq: Every day | ORAL | Status: DC
  Administered 2018-07-05: 600 mg via ORAL
  Filled 2018-07-05: qty 2

## 2018-07-05 MED ORDER — DIAZEPAM 5 MG PO TABS
5.0000 mg | ORAL_TABLET | Freq: Every day | ORAL | Status: DC
  Administered 2018-07-05 – 2018-07-06 (×2): 5 mg via ORAL
  Filled 2018-07-05 (×2): qty 1

## 2018-07-05 MED ORDER — DIAZEPAM 5 MG PO TABS: 10.0000 mg | ORAL_TABLET | ORAL | Status: DC

## 2018-07-05 MED ORDER — VALACYCLOVIR HCL 500 MG PO TABS
1000.0000 mg | ORAL_TABLET | Freq: Every day | ORAL | Status: DC
  Administered 2018-07-05 – 2018-07-06 (×2): 1000 mg via ORAL
  Filled 2018-07-05 (×2): qty 2

## 2018-07-05 MED ORDER — LYSINE 500 MG PO TABS: 500.0000 mg | ORAL_TABLET | Freq: Every day | ORAL | Status: DC
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
## 2018-07-05 NOTE — Plan of Care (Signed)
  Problem: Spiritual Needs Goal: Ability to function at adequate level Outcome: Progressing   Problem: Education: Goal: Knowledge of General Education information will improve Description Including pain rating scale, medication(s)/side effects and non-pharmacologic comfort measures Outcome: Progressing   Problem: Health Behavior/Discharge Planning: Goal: Ability to manage health-related needs will improve Outcome: Progressing   Problem: Clinical Measurements: Goal: Ability to maintain clinical measurements within normal limits will improve Outcome: Progressing Goal: Will remain free from infection Outcome: Progressing   Problem: Activity: Goal: Risk for activity intolerance will decrease Outcome: Progressing   Problem: Nutrition: Goal: Adequate nutrition will be maintained Outcome: Progressing   Problem: Coping: Goal: Level of anxiety will decrease Outcome: Progressing   Problem: Pain Managment: Goal: General experience of comfort will improve Outcome: Progressing   Problem: Safety: Goal: Ability to remain free from injury will improve Outcome: Progressing   Problem: Skin Integrity: Goal: Risk for impaired skin integrity will decrease Outcome: Progressing Sonny MastersRebecca C Aya Geisel, RN 07/05/2018 11:51 PM
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
## 2018-07-05 NOTE — Progress Notes (Signed)
  [facility] Dis[facility]  Carla [name] [id number]  Admission date: 07/02/2018 Discharge date: 07/01/2018  Reason for admission: Worsening depression symptoms  Per assessment note:Carla [name] 57 year old Caucasian female presents for worsening depression, anxiety and insomnia.  Patient reports battling depression for many years.  Reports recently experiencing symptoms of dizziness related to medications where she referred to by neurology.  Patient reports she is currently prescribed volume 15 mg, trazodone 50 mg, Pristiq 50 mg, Rexulti 1 mg, gabapentin 600, melatonin 5 mg and Percocet. Patient reports she was advised to discontinue Neurontin or Valium however states these medications help with her insomnia. Reports she was prescribed Remeron in the past however had to discontinue due to weight gain.  Reports her primary care provider recently restarted Remeron states she is resting well at night.  Carla [name] continues to express apprehension with medications adjustments.  Progress in Program Toward Treatment Goals: Patient attended and participated with daily group sessions was reported minimal participation and she presented guarded, flat but pleasant.  Chart review patient requested to be discharged from IOP in order to be evaluated at a psychiatric facility in  the [place] of [place]. Per her previous inpatient admission.   Progress (rationale): Ongoing, patient was not evaluated at discharge, as per her request to follow-up with previous psychiatrist.   Take all medications as prescribed. Keep all follow-up appointments as scheduled.  Do not consume alcohol or use illegal drugs while on prescription medications. Report any adverse effects from your medications to your primary care provider promptly.  In the event of recurrent symptoms or worsening symptoms, call 911, a crisis hotline, or go to the nearest emergency department for evaluation.    Carla Rackanika N Angee Gupton, NP 07/05/2018

## 2018-07-05 NOTE — Progress Notes (Signed)
Patient complained about suicide sitter assigned to her last night in ICU regarding him playing loud music while patient sleeping.Patient requested not to have the same sitter assigned to her again. Notified staffing office.

## 2018-07-05 NOTE — Progress Notes (Signed)
Received patient as transfer. Patient alert and oriented. Right neck dressing clean dry and intact. Patient oriented to room, call bell, and light control. Safety sitter at bedside. Room free of harmful objects. Will continue to monitor.

## 2018-07-06 ENCOUNTER — Other Ambulatory Visit: Payer: Self-pay

## 2018-07-06 ENCOUNTER — Other Ambulatory Visit (HOSPITAL_COMMUNITY): Payer: BLUE CROSS/BLUE SHIELD

## 2018-07-06 ENCOUNTER — Encounter (HOSPITAL_COMMUNITY): Payer: Self-pay | Admitting: General Practice

## 2018-07-06 ENCOUNTER — Encounter (HOSPITAL_COMMUNITY): Payer: Self-pay | Admitting: *Deleted

## 2018-07-06 ENCOUNTER — Inpatient Hospital Stay (HOSPITAL_COMMUNITY)
Admission: AD | Admit: 2018-07-06 | Discharge: 2018-07-12 | DRG: 885 | Disposition: A | Discharge status: Home / Self Care | Payer: BLUE CROSS/BLUE SHIELD | Source: Intra-hospital | Attending: Psychiatry | Admitting: Psychiatry

## 2018-07-06 DIAGNOSIS — X838XXD Intentional self-harm by other specified means, subsequent encounter: Secondary | ICD-10-CM

## 2018-07-06 DIAGNOSIS — F333 Major depressive disorder, recurrent, severe with psychotic symptoms: Secondary | ICD-10-CM | POA: Diagnosis not present

## 2018-07-06 DIAGNOSIS — G2581 Restless legs syndrome: Secondary | ICD-10-CM | POA: Diagnosis not present

## 2018-07-06 DIAGNOSIS — T794XXA Traumatic shock, initial encounter: Secondary | ICD-10-CM | POA: Diagnosis not present

## 2018-07-06 DIAGNOSIS — S1191XD Laceration without foreign body of unspecified part of neck, subsequent encounter: Secondary | ICD-10-CM

## 2018-07-06 DIAGNOSIS — Z79891 Long term (current) use of opiate analgesic: Secondary | ICD-10-CM | POA: Diagnosis not present

## 2018-07-06 DIAGNOSIS — Z79890 Hormone replacement therapy (postmenopausal): Secondary | ICD-10-CM | POA: Diagnosis not present

## 2018-07-06 DIAGNOSIS — G4700 Insomnia, unspecified: Secondary | ICD-10-CM | POA: Diagnosis present

## 2018-07-06 DIAGNOSIS — E039 Hypothyroidism, unspecified: Secondary | ICD-10-CM | POA: Diagnosis not present

## 2018-07-06 DIAGNOSIS — Z79899 Other long term (current) drug therapy: Secondary | ICD-10-CM

## 2018-07-06 DIAGNOSIS — J329 Chronic sinusitis, unspecified: Secondary | ICD-10-CM | POA: Diagnosis not present

## 2018-07-06 DIAGNOSIS — S1181XA Laceration without foreign body of other specified part of neck, initial encounter: Secondary | ICD-10-CM | POA: Diagnosis not present

## 2018-07-06 DIAGNOSIS — F411 Generalized anxiety disorder: Secondary | ICD-10-CM | POA: Diagnosis not present

## 2018-07-06 DIAGNOSIS — F329 Major depressive disorder, single episode, unspecified: Secondary | ICD-10-CM | POA: Diagnosis not present

## 2018-07-06 DIAGNOSIS — F332 Major depressive disorder, recurrent severe without psychotic features: Secondary | ICD-10-CM | POA: Diagnosis not present

## 2018-07-06 DIAGNOSIS — M810 Age-related osteoporosis without current pathological fracture: Secondary | ICD-10-CM | POA: Diagnosis present

## 2018-07-06 DIAGNOSIS — F607 Dependent personality disorder: Secondary | ICD-10-CM | POA: Diagnosis not present

## 2018-07-06 DIAGNOSIS — Z818 Family history of other mental and behavioral disorders: Secondary | ICD-10-CM

## 2018-07-06 DIAGNOSIS — E861 Hypovolemia: Secondary | ICD-10-CM | POA: Diagnosis not present

## 2018-07-06 DIAGNOSIS — S1191XA Laceration without foreign body of unspecified part of neck, initial encounter: Secondary | ICD-10-CM | POA: Diagnosis not present

## 2018-07-06 DIAGNOSIS — F419 Anxiety disorder, unspecified: Secondary | ICD-10-CM | POA: Diagnosis not present

## 2018-07-06 DIAGNOSIS — T1491XA Suicide attempt, initial encounter: Secondary | ICD-10-CM | POA: Diagnosis not present

## 2018-07-06 LAB — CBC
HCT: 38.8 % (ref 36.0–46.0)
Hemoglobin: 12.9 g/dL (ref 12.0–15.0)
MCH: 30.3 pg (ref 26.0–34.0)
MCHC: 33.2 g/dL (ref 30.0–36.0)
MCV: 91.1 fL (ref 78.0–100.0)
Platelets: 121 10*3/uL — ABNORMAL LOW (ref 150–400)
RBC: 4.26 MIL/uL (ref 3.87–5.11)
RDW: 15.4 % (ref 11.5–15.5)
WBC: 5.1 10*3/uL (ref 4.0–10.5)

## 2018-07-06 LAB — BLOOD PRODUCT ORDER (VERBAL) VERIFICATION

## 2018-07-06 LAB — TSH: TSH: 3.072 u[IU]/mL (ref 0.350–4.500)

## 2018-07-06 MED ORDER — VALACYCLOVIR HCL 500 MG PO TABS
1000.0000 mg | ORAL_TABLET | Freq: Every day | ORAL | Status: DC
  Administered 2018-07-07 – 2018-07-11 (×5): 1000 mg via ORAL
  Filled 2018-07-06 (×4): qty 2
  Filled 2018-07-06: qty 4
  Filled 2018-07-06 (×2): qty 2

## 2018-07-06 MED ORDER — GABAPENTIN 300 MG PO CAPS
600.0000 mg | ORAL_CAPSULE | Freq: Every day | ORAL | Status: DC
  Administered 2018-07-06 – 2018-07-11 (×6): 600 mg via ORAL
  Filled 2018-07-06 (×9): qty 2

## 2018-07-06 MED ORDER — DIAZEPAM 5 MG PO TABS: 5.0000 mg | ORAL_TABLET | Freq: Every day | ORAL | Status: DC

## 2018-07-06 MED ORDER — CALCIUM CARBONATE 1250 (500 CA) MG PO TABS
1.0000 | ORAL_TABLET | Freq: Two times a day (BID) | ORAL | Status: DC
  Administered 2018-07-07 – 2018-07-11 (×10): 500 mg via ORAL
  Filled 2018-07-06 (×3): qty 1
  Filled 2018-07-06: qty 3
  Filled 2018-07-06 (×3): qty 1
  Filled 2018-07-06: qty 3
  Filled 2018-07-06 (×9): qty 1

## 2018-07-06 MED ORDER — OXYCODONE HCL 5 MG PO TABS: 2.5000 mg | ORAL_TABLET | ORAL | 0 refills | Status: DC | PRN

## 2018-07-06 MED ORDER — BREXPIPRAZOLE 1 MG PO TABS
1.0000 mg | ORAL_TABLET | Freq: Every day | ORAL | Status: DC
  Filled 2018-07-06 (×2): qty 1

## 2018-07-06 MED ORDER — ALUM & MAG HYDROXIDE-SIMETH 200-200-20 MG/5ML PO SUSP: 30.0000 mL | ORAL | Status: DC | PRN

## 2018-07-06 MED ORDER — DESVENLAFAXINE SUCCINATE ER 50 MG PO TB24
50.0000 mg | ORAL_TABLET | Freq: Every day | ORAL | Status: DC
  Administered 2018-07-07 – 2018-07-11 (×5): 50 mg via ORAL
  Filled 2018-07-06 (×6): qty 1
  Filled 2018-07-06: qty 2

## 2018-07-06 MED ORDER — MAGNESIUM GLUCONATE 500 MG PO TABS
500.0000 mg | ORAL_TABLET | Freq: Two times a day (BID) | ORAL | Status: DC
  Filled 2018-07-06: qty 1

## 2018-07-06 MED ORDER — DIAZEPAM 5 MG PO TABS
10.0000 mg | ORAL_TABLET | Freq: Every day | ORAL | Status: DC
  Administered 2018-07-06 – 2018-07-11 (×6): 10 mg via ORAL
  Filled 2018-07-06 (×6): qty 2

## 2018-07-06 MED ORDER — VITAMIN D3 25 MCG (1000 UNIT) PO TABS
1000.0000 [IU] | ORAL_TABLET | Freq: Every day | ORAL | Status: DC
  Administered 2018-07-07 – 2018-07-11 (×6): 1000 [IU] via ORAL
  Filled 2018-07-06 (×7): qty 1
  Filled 2018-07-06: qty 2
  Filled 2018-07-06 (×2): qty 1

## 2018-07-06 MED ORDER — OXYCODONE HCL 5 MG PO TABS
2.5000 mg | ORAL_TABLET | ORAL | Status: DC | PRN
  Administered 2018-07-06: 5 mg via ORAL
  Filled 2018-07-06: qty 1

## 2018-07-06 MED ORDER — LEVOTHYROXINE SODIUM 125 MCG PO TABS
125.0000 ug | ORAL_TABLET | Freq: Every day | ORAL | Status: DC
  Administered 2018-07-07 – 2018-07-11 (×5): 125 ug via ORAL
  Filled 2018-07-06 (×6): qty 1
  Filled 2018-07-06: qty 2
  Filled 2018-07-06: qty 1

## 2018-07-06 MED ORDER — MAGNESIUM HYDROXIDE 400 MG/5ML PO SUSP: 30.0000 mL | Freq: Every day | ORAL | Status: DC | PRN

## 2018-07-06 MED ORDER — MIRTAZAPINE 15 MG PO TABS
15.0000 mg | ORAL_TABLET | Freq: Every day | ORAL | Status: DC
  Administered 2018-07-06 – 2018-07-07 (×2): 15 mg via ORAL
  Filled 2018-07-06 (×4): qty 1

## 2018-07-06 MED ORDER — LYSINE 500 MG PO TABS: 500.0000 mg | ORAL_TABLET | Freq: Every day | ORAL | Status: DC

## 2018-07-06 MED ORDER — OXYCODONE HCL 5 MG PO TABS: 2.5000 mg | ORAL_TABLET | ORAL | Status: DC | PRN

## 2018-07-06 NOTE — Progress Notes (Signed)
[facility] VSS. [facility] [employer] escorted pt.
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
## 2018-07-06 NOTE — Social Work (Addendum)
Acknowledging consult for psychiatric placement, will [facility] and complete voluntary form with pt. Aware if pt does not consent to be voluntarily placed psychiatry is recommending IVC.   9:43am- CSW spoke with Lillia [facility]Lindsay [facility] she will look at pt records and make [facility]. [facility]. Lillia AbedLindsay will also f/u with this Clinical research associatewriter in regards to [facility].   Doy HutchingIsabel H Doralyn Kirkes, LCSWA [facility] Clinical Social Work [phone]

## 2018-07-06 NOTE — Progress Notes (Signed)
[facility] Anticipated discharge date: 07/06/18 Family notified: Merita NortonAshley Wilson, sister-in-law Transportation by: [facility]. Pick[facility] scheduled for 7:45 pm. [facility].  [facility] room 307 [facility].   Nurse to call report to [phone]. Report must be called before patient is picked up by transport.   CSW signing off.  Abigail ButtsSusan Tanveer Brammer, LCSWA  Clinical Social Worker
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
## 2018-07-06 NOTE — Progress Notes (Signed)
   07/06/18 1000  Clinical Encounter Type  Visited With Patient and family together;Other (Comment);Health care provider (sitter present)  Visit Type Initial;Psychological support;Behavioral Health  Referral From Social work  Spiritual Encounters  Spiritual Needs Emotional;Grief support   Met w/ pt while RN was present, RN left and sitter returned, pt's sister-in-law came shortly thereafter.  Compassionate presence; listened when pt talked about what had led to hospitalization ("an impulse"), her feelings of concern about her 57 yo son finding her, and her grief and self-reported feelings of guilt over her actions and their effects.  Chaplain left b/c pt wanted sponge bath and hair washed (she mentioned being able to smell the dried blood in her hair); chaplain remains available for further support if desired.  Myra Gianotti resident, [phone]
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
## 2018-07-06 NOTE — Progress Notes (Signed)
Pt presents very anxious and medication focused , especially with her Valium

## 2018-07-06 NOTE — Tx Team (Signed)
Initial Treatment Plan 07/06/2018 9:24 PM Carla Cellarorothy W XXXChappell [name]    PATIENT STRESSORS: Health problems Other: goint to treatment facility   PATIENT STRENGTHS: Ability for insight Average or above average intelligence Capable of independent living General fund of knowledge Motivation for treatment/growth   PATIENT IDENTIFIED PROBLEMS: Depression Suicidal actions "Keep me on my medications" "only one I want to stop is the rexalti" "Ween off valium"                     DISCHARGE CRITERIA:  Ability to meet basic life and health needs Improved stabilization in mood, thinking, and/or behavior Reduction of life-threatening or endangering symptoms to within safe limits Verbal commitment to aftercare and medication compliance  PRELIMINARY DISCHARGE PLAN: Attend aftercare/continuing care group Return to previous living arrangement  PATIENT/FAMILY INVOLVEMENT: This treatment plan has been presented to and reviewed with the patient, Carla CellarDorothy W XXXChappell, and/or family member, .  The patient and family have been given the opportunity to ask questions and make suggestions.  Carla [name], [place], [place] 07/06/2018, 9:24 PM

## 2018-07-06 NOTE — Discharge Instructions (Signed)
Wound Care: - shower daily with wound uncovered and allow soap/water to run over incision. Gently pat dry. May cover with dressing if desired, but not needed. - sutures should be removed 07/09/18. [facility] or at any medical facility capable of this. May apply steri-strips to incision upon suture removal
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
## 2018-07-07 ENCOUNTER — Other Ambulatory Visit (HOSPITAL_COMMUNITY): Payer: BLUE CROSS/BLUE SHIELD

## 2018-07-07 DIAGNOSIS — E861 Hypovolemia: Secondary | ICD-10-CM

## 2018-07-07 DIAGNOSIS — T1491XA Suicide attempt, initial encounter: Secondary | ICD-10-CM

## 2018-07-07 DIAGNOSIS — F419 Anxiety disorder, unspecified: Secondary | ICD-10-CM

## 2018-07-07 DIAGNOSIS — S1191XA Laceration without foreign body of unspecified part of neck, initial encounter: Secondary | ICD-10-CM

## 2018-07-07 DIAGNOSIS — F332 Major depressive disorder, recurrent severe without psychotic features: Principal | ICD-10-CM

## 2018-07-07 DIAGNOSIS — G4700 Insomnia, unspecified: Secondary | ICD-10-CM

## 2018-07-07 LAB — HEMOGLOBIN A1C
Hgb A1c MFr Bld: 5.2 % (ref 4.8–5.6)
Mean Plasma Glucose: 102.54 mg/dL

## 2018-07-07 LAB — LIPID PANEL
Cholesterol: 194 mg/dL (ref 0–200)
HDL: 60 mg/dL (ref >40)
LDL Cholesterol: 116 mg/dL — ABNORMAL HIGH (ref 0–99)
Total CHOL/HDL Ratio: 3.2 RATIO
Triglycerides: 92 mg/dL (ref <150)
VLDL: 18 mg/dL (ref 0–40)

## 2018-07-07 MED ORDER — OXYCODONE HCL 5 MG PO TABS: 2.5000 mg | ORAL_TABLET | Freq: Three times a day (TID) | ORAL | Status: DC | PRN

## 2018-07-07 MED ORDER — MAGNESIUM OXIDE 400 (241.3 MG) MG PO TABS
400.0000 mg | ORAL_TABLET | Freq: Two times a day (BID) | ORAL | Status: DC
  Administered 2018-07-07 – 2018-07-11 (×10): 400 mg via ORAL
  Filled 2018-07-07 (×6): qty 1
  Filled 2018-07-07: qty 3
  Filled 2018-07-07 (×2): qty 1
  Filled 2018-07-07: qty 3
  Filled 2018-07-07 (×3): qty 1
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
## 2018-07-07 NOTE — Progress Notes (Signed)
[facility] Group Notes:  (Nursing/MHT/Case Management/Adjunct)  Date:  07/07/2018  Time: 4:00 PM  Type of Therapy:  Nurse Education  Participation Level:  Active  Participation Quality:  Appropriate and Supportive  Affect:  Appropriate  Cognitive:  Alert and Appropriate  Insight:  Appropriate  Engagement in Group:  Developing/Improving and Engaged  Modes of Intervention:  Discussion and Education  Summary of Progress/Problems: Patient is progressing in plan of treatment.  Dewayne Shorterlyssa  Clairessa Boulet 07/07/2018, 5:32 PM
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
## 2018-07-07 NOTE — Progress Notes (Signed)
Patient ID: Carla [name], female   DOB: 03/09/1961, 57 y.o.   MRN: [id number]  Nursing Progress Note [id number]  Data: Patient presents very anxious and tearful today. Patient has requested to speak to writer 1:1 about her suicide attempt. Patient reports she is remorseful over the incident and states, "I feel so guilty, my son found me and had the good sense to call EMS". Patient endorses prior medication changes and feeling scared to leave her children for 4-6 weeks for treatment. Patient states, "it's completely illogical. How could I almost leave them forever?" Patient currently denies any thoughts of SI/HI/AVH. Patient does present with intense eye contact and depressed mood. Patient complaint with scheduled medications. Patient denies pain/physical complaints. Patient completed self-inventory sheet and rates depression, hopelessness, and anxiety 3,0,4 respectively. Patient rates their sleep and appetite as good/good respectively. Patient states goal for today is to "adjust to this new environment". Patient is seen attending groups and visible in the milieu.   Action: Patient is educated about and provided medication per provider's orders. Patient safety maintained with q15 min safety checks and frequent rounding. Low fall risk precautions in place. Emotional support given. 1:1 interaction and active listening provided. Patient encouraged to attend meals, groups, and work on treatment plan and goals. Labs, vital signs and patient behavior monitored throughout shift.   Response: Patient remains safe on the unit at this time and agrees to come to staff with any issues/concerns. Patient is interacting with peers appropriately on the unit. Will continue to support and monitor.

## 2018-07-07 NOTE — Progress Notes (Signed)
Pt attended goals and orientation group this morning, participation was moderate. 

## 2018-07-07 NOTE — [facility] Group Notes (Signed)
LCSW Group Therapy Note  07/07/2018 1:15pm  Type of Therapy/Topic:  Group Therapy:  Balance in Life  Participation Level:  Did Not Attend--pt invited. Chose to remain in bed.   Description of Group:    This group will address the concept of balance and how it feels and looks when one is unbalanced. Patients will be encouraged to process areas in their lives that are out of balance and identify reasons for remaining unbalanced. Facilitators will guide patients in utilizing problem-solving interventions to address and correct the stressor making their life unbalanced. Understanding and applying boundaries will be explored and addressed for obtaining and maintaining a balanced life. Patients will be encouraged to explore ways to assertively make their unbalanced needs known to significant others in their lives, using other group members and facilitator for support and feedback.  Therapeutic Goals: 1. Patient will identify two or more emotions or situations they have that consume much of in their lives. 2. Patient will identify signs/triggers that life has become out of balance:  3. Patient will identify two ways to set boundaries in order to achieve balance in their lives:  4. Patient will demonstrate ability to communicate their needs through discussion and/or role plays  Summary of Patient Progress:  x    Therapeutic Modalities:   Cognitive Behavioral Therapy Solution-Focused Therapy Assertiveness Training  Rona RavensHeather S Teryl Mcconaghy, LCSW 07/07/2018 3:28 PM
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
## 2018-07-07 NOTE — [facility] Group Notes (Signed)
[facility] LCSW Group Therapy Note  Date/Time: 07/07/18, 1315  Type of Therapy/Topic:  Group Therapy:  Balance in Life  Participation Level:  moderate  Description of Group:    This group will address the concept of balance and how it feels and looks when one is unbalanced. Patients will be encouraged to process areas in their lives that are out of balance, and identify reasons for remaining unbalanced. Facilitators will guide patients utilizing problem- solving interventions to address and correct the stressor making their life unbalanced. Understanding and applying boundaries will be explored and addressed for obtaining  and maintaining a balanced life. Patients will be encouraged to explore ways to assertively make their unbalanced needs known to significant others in their lives, using other group members and facilitator for support and feedback.  Therapeutic Goals: 1. Patient will identify two or more emotions or situations they have that consume much of in their lives. 2. Patient will identify signs/triggers that life has become out of balance:  3. Patient will identify two ways to set boundaries in order to achieve balance in their lives:  4. Patient will demonstrate ability to communicate their needs through discussion and/or role plays  Summary of Patient Progress:Pt identified physical and mental/emotional as areas that are out of balance in her life.  Pt made several comments during group discussion about how to set boundaries and otherwise respond to areas that have become out of balance and was attentive throughout the group.            Therapeutic Modalities:   Cognitive Behavioral Therapy Solution-Focused Therapy Assertiveness Training  Daleen SquibbGreg Joncarlo Friberg, [place]CS[place]
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
## 2018-07-08 ENCOUNTER — Other Ambulatory Visit (HOSPITAL_COMMUNITY): Payer: BLUE CROSS/BLUE SHIELD

## 2018-07-08 LAB — PROLACTIN: Prolactin: 10.2 ng/mL (ref 4.8–23.3)

## 2018-07-08 MED ORDER — OXYCODONE HCL 5 MG PO TABS
2.5000 mg | ORAL_TABLET | Freq: Two times a day (BID) | ORAL | Status: DC | PRN
  Administered 2018-07-11: 2.5 mg via ORAL
  Filled 2018-07-08: qty 1

## 2018-07-08 MED ORDER — MIRTAZAPINE 30 MG PO TABS
30.0000 mg | ORAL_TABLET | Freq: Every day | ORAL | Status: DC
  Administered 2018-07-08 – 2018-07-11 (×4): 30 mg via ORAL
  Filled 2018-07-08 (×6): qty 1

## 2018-07-08 NOTE — Plan of Care (Signed)
  Problem: Education: Goal: Knowledge of [place] Education information/materials will improve Outcome: Completed/Met Goal: Verbalization of understanding the information provided will improve Outcome: Completed/Met   

## 2018-07-08 NOTE — Progress Notes (Signed)
Recreation Therapy Notes  Date: 9.20.19 Time: 0930 Location: 300 Hall Dayroom  Group Topic: Stress Management  Goal Area(s) Addresses:  Patient will verbalize importance of using healthy stress management.  Patient will identify positive emotions associated with healthy stress management.   Behavioral Response: Engaged  Intervention: Stress Management  Activity : Progressive Muscle Relaxation.  LRT introduced the stress management technique of progressive muscle relaxation.  LRT read [name] script and lead patients through [name] series of techniques that guided them to tense each muscle group individually and then relax them.  Patients were to listen as script was read to engage in activity.  Education:  Stress Management, Discharge Planning.   Education Outcome: Acknowledges edcuation/In group clarification offered/Needs additional education  Clinical Observations/Feedback: Pt attended and participated in activity.    Ellie Bryand, LRT/CTRS         Carla [name] 07/08/2018 12:19 PM 

## 2018-07-08 NOTE — Tx Team (Signed)
Interdisciplinary Treatment and Diagnostic Plan Update  07/08/2018 Time of Session: 1534 Carla [name] MRN: [id number]  Principal Diagnosis: <principal problem not specified>  Secondary Diagnoses: Active Problems:   Severe recurrent major depression without psychotic features (HCC)   Current Medications:  Current Facility-Administered Medications  Medication Dose Route Frequency Provider Last Rate Last Dose  . alum & mag hydroxide-simeth (MAALOX/MYLANTA) 200-200-20 MG/5ML suspension 30 mL  30 mL Oral Q4H PRN Nira Conn A, NP      . calcium carbonate (OS-CAL - dosed in mg of elemental calcium) tablet 500 mg of elemental calcium  1 tablet Oral BID WC Nira Conn A, NP   500 mg of elemental calcium at 07/08/18 0808  . cholecalciferol (VITAMIN D) tablet 1,000 Units  1,000 Units Oral Daily Nira Conn A, NP   1,000 Units at 07/08/18 4098  . desvenlafaxine (PRISTIQ) 24 hr tablet 50 mg  50 mg Oral Daily Nira Conn A, NP   50 mg at 07/08/18 1191  . diazepam (VALIUM) tablet 10 mg  10 mg Oral QHS Cobos, Rockey Situ, MD   10 mg at 07/07/18 2122  . gabapentin (NEURONTIN) capsule 600 mg  600 mg Oral QHS Cobos, Rockey Situ, MD   600 mg at 07/07/18 2134  . levothyroxine (SYNTHROID, LEVOTHROID) tablet 125 mcg  125 mcg Oral QAC breakfast Nira Conn A, NP   125 mcg at 07/08/18 4782  . magnesium hydroxide (MILK OF MAGNESIA) suspension 30 mL  30 mL Oral Daily PRN Nira Conn A, NP      . magnesium oxide (MAG-OX) tablet 400 mg  400 mg Oral BID Cobos, Rockey Situ, MD   400 mg at 07/08/18 0810  . mirtazapine (REMERON) tablet 30 mg  30 mg Oral QHS Cobos, Fernando A, MD      . oxyCODONE (Oxy IR/ROXICODONE) immediate release tablet 2.5 mg  2.5 mg Oral Q12H PRN Cobos, Rockey Situ, MD      . valACYclovir (VALTREX) tablet 1,000 mg  1,000 mg Oral Daily Nira Conn A, NP   1,000 mg at 07/08/18 0809   PTA Medications: Medications Prior to Admission  Medication Sig Dispense Refill Last Dose  . calcium  carbonate (OS-CAL - DOSED IN MG OF ELEMENTAL CALCIUM) 1250 (500 Ca) MG tablet Take 1 tablet by mouth 2 (two) times daily with a meal.   07/03/2018 at Unknown time  . cholecalciferol (VITAMIN D) 1000 units tablet Take 1,000 Units by mouth daily.   07/03/2018 at Unknown time  . cyclobenzaprine (FLEXERIL) 10 MG tablet Take 10 mg by mouth 3 (three) times daily as needed for muscle spasms.  3 07/03/2018 at Unknown time  . desvenlafaxine (PRISTIQ) 50 MG 24 hr tablet Take 50 mg by mouth daily.  1 07/03/2018 at Unknown time  . diazepam (VALIUM) 10 MG tablet Take 10 mg by mouth See admin instructions. Taking 1/2 tablet (5mg  ) at dinner and 10mg  at bedtime.  1 07/03/2018 at Unknown time  . gabapentin (NEURONTIN) 300 MG capsule Take 600 mg by mouth at bedtime.  1 07/03/2018 at Unknown time  . Lysine 500 MG TABS Take 500 mg by mouth daily.   07/03/2018 at Unknown time  . magnesium gluconate (MAGONATE) 500 MG tablet Take 500 mg by mouth 2 (two) times daily.   07/03/2018 at Unknown time  . mirtazapine (REMERON) 15 MG tablet Take 15 mg by mouth at bedtime.  0 07/03/2018 at Unknown time  . oxyCODONE (OXY IR/ROXICODONE) 5 MG immediate release tablet Take 0.5-1  tablets (2.5-5 mg total) by mouth every 4 (four) hours as needed for moderate pain or severe pain. 15 tablet 0   . REXULTI 1 MG TABS Take 1 mg by mouth daily.  1 07/03/2018 at Unknown time  . SYNTHROID 125 MCG tablet Take 0.125 mcg by mouth daily.  1 07/03/2018 at Unknown time  . traZODone (DESYREL) 100 MG tablet Take 50 mg by mouth at bedtime.  0 07/03/2018 at Unknown time  . valACYclovir (VALTREX) 1000 MG tablet Take 1 g by mouth daily.  1 07/03/2018 at Unknown time    Patient Stressors: Health problems Other: goint to treatment facility  Patient Strengths: Ability for insight Average or above average intelligence Capable of independent living General fund of knowledge Motivation for treatment/growth  Treatment Modalities: Medication Management, Group therapy,  Case management,  1 to 1 session with clinician, Psychoeducation, Recreational therapy.   Physician Treatment Plan for Primary Diagnosis: <principal problem not specified> Long Term Goal(s): Improvement in symptoms so as ready for discharge Improvement in symptoms so as ready for discharge   Short Term Goals: Ability to identify changes in lifestyle to reduce recurrence of condition will improve Ability to maintain clinical measurements within normal limits will improve Ability to identify changes in lifestyle to reduce recurrence of condition will improve Ability to verbalize feelings will improve Ability to disclose and discuss suicidal ideas Ability to demonstrate self-control will improve Ability to identify and develop effective coping behaviors will improve Ability to maintain clinical measurements within normal limits will improve  Medication Management: Evaluate patient's response, side effects, and tolerance of medication regimen.  Therapeutic Interventions: 1 to 1 sessions, Unit Group sessions and Medication administration.  Evaluation of Outcomes: Progressing  Physician Treatment Plan for Secondary Diagnosis: Active Problems:   Severe recurrent major depression without psychotic features (HCC)  Long Term Goal(s): Improvement in symptoms so as ready for discharge Improvement in symptoms so as ready for discharge   Short Term Goals: Ability to identify changes in lifestyle to reduce recurrence of condition will improve Ability to maintain clinical measurements within normal limits will improve Ability to identify changes in lifestyle to reduce recurrence of condition will improve Ability to verbalize feelings will improve Ability to disclose and discuss suicidal ideas Ability to demonstrate self-control will improve Ability to identify and develop effective coping behaviors will improve Ability to maintain clinical measurements within normal limits will improve      Medication Management: Evaluate patient's response, side effects, and tolerance of medication regimen.  Therapeutic Interventions: 1 to 1 sessions, Unit Group sessions and Medication administration.  Evaluation of Outcomes: Progressing   RN Treatment Plan for Primary Diagnosis: <principal problem not specified> Long Term Goal(s): Knowledge of disease and therapeutic regimen to maintain health will improve  Short Term Goals: Medication Compliance and develop use of coping skills.   Medication Management: RN will administer medications as ordered by provider, will assess and evaluate patient's response and provide education to patient for prescribed medication. RN will report any adverse and/or side effects to prescribing provider.  Therapeutic Interventions: 1 on 1 counseling sessions, Psychoeducation, Medication administration, Evaluate responses to treatment, Monitor vital signs and CBGs as ordered, Perform/monitor CIWA, COWS, AIMS and Fall Risk screenings as ordered, Perform wound care treatments as ordered.  Evaluation of Outcomes: Progressing   LCSW Treatment Plan for Primary Diagnosis: <principal problem not specified> Long Term Goal(s): Safe transition to appropriate next level of care at discharge, Engage patient in therapeutic group addressing interpersonal concerns.  Short  Term Goals: Engage patient in aftercare planning with referrals and resources, Increase social support and Increase skills for wellness and recovery  Therapeutic Interventions: Assess for all discharge needs, 1 to 1 time with Social worker, Explore available resources and support systems, Assess for adequacy in community support network, Educate family and significant other(s) on suicide prevention, Complete Psychosocial Assessment, Interpersonal group therapy.  Evaluation of Outcomes: Progressing   Progress in Treatment: Attending groups: Yes. Participating in groups: Yes. Taking medication as  prescribed: Yes. Toleration medication: Yes. Family/Significant other contact made: No, will contact:  sister Patient understands diagnosis: Yes. Discussing patient identified problems/goals with staff: Yes. Medical problems stabilized or resolved: Yes. Denies suicidal/homicidal ideation: Yes. Issues/concerns per patient self-inventory: No. Other: none  New problem(s) identified: No, Describe:  none  New Short Term/Long Term Goal(s):  Patient Goals:  Participate in groups, discharge to [facility] in [place].  Discharge Plan or Barriers:   Reason for Continuation of Hospitalization: Depression Medication stabilization  Estimated Length of Stay: 2-4 days.  Attendees: Patient: Carla [name] 07/08/2018   Physician: Dr. Jama Flavors, MD 07/08/2018   Nursing: Joslyn Devon, RN 07/08/2018   RN Care Manager: 07/08/2018   Social Worker: Daleen Squibb, LCSW 07/08/2018   Recreational Therapist:  07/08/2018   Other:  07/08/2018   Other:  07/08/2018   Other: 07/08/2018        Scribe for Treatment Team: Lorri Frederick, LCSW 07/08/2018 3:51 PM

## 2018-07-08 NOTE — [facility] Group Notes (Signed)
Adult Psychoeducational Group Note  Date:  07/08/2018 Time:  12:11 PM  Group Topic/Focus:  Wellness Toolbox:   The focus of this group is to discuss various aspects of wellness, balancing those aspects and exploring ways to increase the ability to experience wellness.  Patients will create a wellness toolbox for use upon discharge.  Participation Level:  Active  Participation Quality:  Appropriate  Affect:  Appropriate  Cognitive:  Alert  Insight: Appropriate  Engagement in Group:  Engaged  Modes of Intervention:  Orientation  Additional Comments:  Pt participated in group activity  Dellia NimsJaquesha M Merlyn Conley 07/08/2018, 12:11 PM

## 2018-07-08 NOTE — Plan of Care (Signed)
  Problem: Education: Goal: Emotional status will improve 07/08/2018 1624 by Rutherford Guysuke, Arif Amendola L, RN Outcome: Progressing 07/08/2018 1438 by Rutherford Guysuke, Shondra Capps L, RN Outcome: Not Progressing

## 2018-07-08 NOTE — Progress Notes (Signed)
D Pt is observed OOB UAL on the 400 hall today..she tolerates this fairly well. It is obvious  , when interacting with her, she has a very HIGH anxiety level. She demonstrates Bhutan[place]athesia-like behavior , as she tries to stand still and speak to this Clinical research associatewriter, she moves from one foot.Counseling given: Not Answered  the other. She says " I've always doen this " when writer asks her about this.     A She endorses a flat, depressed, quite anxious affect . She attends her groups. She is obsessed with brushing nher teeth. She completed her daily assessment andnon this she wrote she deneid SI today and she rated her dperession, hpelessness and anxeity " 4/0/3", respectively. She says she is planning to transfer to [facility] [place] when she is " ready".     R Safety in place.

## 2018-07-08 NOTE — [facility] Group Notes (Signed)
Adult Psychoeducational Group Note  Date:  07/08/2018 Time:  9:15 AM  Group Topic/Focus:  Goals Group:   The focus of this group is to help patients establish daily goals to achieve during treatment and discuss how the patient can incorporate goal setting into their daily lives to aide in recovery.  Participation Level:  Active  Participation Quality:  Appropriate  Affect:  Appropriate  Cognitive:  Alert  Insight: Appropriate  Engagement in Group:  Engaged  Modes of Intervention:  Orientation  Additional Comments:  Pt goal for today is to attend groups and get adjusted to the unit  Dellia NimsJaquesha M Connor Foxworthy 07/08/2018, 9:15 AM

## 2018-07-08 NOTE — Plan of Care (Signed)
  Problem: Education: Goal: Knowledge of [place] [facility] General Education information/materials will improve 07/08/2018 1439 by Rutherford Guysuke, Zakiya Sporrer L, RN Outcome: Not Progressing 07/08/2018 1438 by Rutherford Guysuke, Bernice Mcauliffe L, RN Outcome: Progressing Goal: Emotional status will improve Outcome: Not Progressing
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
## 2018-07-08 NOTE — Progress Notes (Signed)
D: Patient observed up and visible in milieu. Patient states she had a good visit with her teenage sons however expressed sadness when they left. Patient's affect blank, preoccupied and anxious. Mood congruent. Denies pain, physical complaints.   A: Medicated per orders, no prns requested or needed. Medication education provided. Level III obs in place for safety. Emotional support offered. Patient encouraged to complete Suicide Safety Plan before discharge. Encouraged to attend and participate in unit programming.   R: Patient verbalizes understanding of POC. Patient denies SI/HI/AVH and remains safe on level III obs. Will continue to monitor throughout the night.

## 2018-07-08 NOTE — Plan of Care (Signed)
  Problem: Education: Goal: Emotional status will improve 07/08/2018 1631 by Rutherford Guysuke, Donoven Pett L, RN Outcome: Progressing 07/08/2018 1624 by Rutherford Guysuke, Janelli Welling L, RN Outcome: Progressing 07/08/2018 1438 by Rutherford Guysuke, Lillyanna Glandon L, RN Outcome: Not Progressing   Problem: Education: Goal: Knowledge of [facility] Education information/materials will improve 07/08/2018 1439 by Rutherford Guysuke, Lakyn Mantione L, RN Outcome: Not Progressing 07/08/2018 1438 by Rutherford Guysuke, Navil Kole L, RN Outcome: Progressing

## 2018-07-08 NOTE — Progress Notes (Signed)
Adult Psychoeducational Group Note  Date:  07/08/2018 Time:  5:43 AM  Group Topic/Focus:  Wrap-Up Group:   The focus of this group is to help patients review their daily goal of treatment and discuss progress on daily workbooks.  Participation Level:  Active  Participation Quality:  Appropriate  Affect:  Appropriate  Cognitive:  Appropriate  Insight: Appropriate and Good  Engagement in Group:  Engaged  Modes of Intervention:  Discussion  Additional Comments:  Her day was a 7. Her day has been up and down all day today. [facility]  Charna Busmanobinson, Nahsir Venezia Long 07/08/2018, 5:43 AM

## 2018-07-08 NOTE — [facility] Group Notes (Signed)
  [facility]  Date/Time: 07/08/18, 1315  Type of Therapy/Topic:  Group Therapy:  Emotion Regulation  Participation Level:  Active   Mood:pleasant  Description of Group:    The purpose of this group is to assist patients in learning to regulate negative emotions and experience positive emotions. Patients will be guided to discuss ways in which they have been vulnerable to their negative emotions. These vulnerabilities will be juxtaposed with experiences of positive emotions or situations, and patients challenged to use positive emotions to combat negative ones. Special emphasis will be placed on coping with negative emotions in conflict situations, and patients will process healthy conflict resolution skills.  Therapeutic Goals: 1. Patient will identify two positive emotions or experiences to reflect on in order to balance out negative emotions:  2. Patient will label two or more emotions that they find the most difficult to experience:  3. Patient will be able to demonstrate positive conflict resolution skills through discussion or role plays:   Summary of Patient Progress:Pt shared that sadness/anxiety are emotions that are difficult to experience.  Pt made several comments during group discussion about positive ways to manage difficult emotions and was attentive throughout.        Therapeutic Modalities:   Cognitive Behavioral Therapy Feelings Identification Dialectical Behavioral Therapy  Daleen SquibbGreg Rhetta Cleek, LCSW

## 2018-07-08 NOTE — Plan of Care (Signed)
  Problem: Education: Goal: Knowledge of  General Education information/materials will improve Outcome: Progressing   

## 2018-07-09 MED ORDER — MUPIROCIN 2 % EX OINT
TOPICAL_OINTMENT | Freq: Two times a day (BID) | CUTANEOUS | Status: DC
  Administered 2018-07-09 – 2018-07-11 (×4): via NASAL
  Filled 2018-07-09 (×2): qty 22

## 2018-07-09 MED ORDER — TRAZODONE HCL 50 MG PO TABS
50.0000 mg | ORAL_TABLET | Freq: Every day | ORAL | Status: DC
  Administered 2018-07-09: 50 mg via ORAL
  Filled 2018-07-09 (×2): qty 1
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
## 2018-07-09 NOTE — Progress Notes (Signed)
Adult Psychoeducational Group Note  Date:  07/09/2018 Time:  3:55 AM  Group Topic/Focus:  Wrap-Up Group:   The focus of this group is to help patients review their daily goal of treatment and discuss progress on daily workbooks.  Participation Level:  Active  Participation Quality:  Appropriate  Affect:  Appropriate  Cognitive:  Appropriate  Insight: Appropriate  Engagement in Group:  Engaged  Modes of Intervention:  Discussion  Additional Comments:  Pt stated her goal was to continue to socialize with other patients.  Pt stated she enjoyed walking in the sunshine with her sock on today.  Pt rated the day at a 8/10.  Carla [name] 07/09/2018, 3:55 AM

## 2018-07-09 NOTE — Progress Notes (Signed)
Adult Psychoeducational Group Note  Date:  07/09/2018 Time:  10:58 PM  Group Topic/Focus:  Wrap-Up Group:   The focus of this group is to help patients review their daily goal of treatment and discuss progress on daily workbooks.  Participation Level:  Active  Participation Quality:  Appropriate  Affect:  Appropriate  Cognitive:  Appropriate  Insight: Appropriate  Engagement in Group:  Engaged  Modes of Intervention:  Discussion  Additional Comments:   Pt stated her goal for today was to interact more with her peers. Pt stated she accomplished her goal today. Pt rated her over all day a 8 out of 10. Pt stated she attend all groups held today. Pt stated the group this morning help improve her day.  Carla FurnaceChristopher  [name] 07/09/2018, 10:58 PM

## 2018-07-09 NOTE — Progress Notes (Signed)
Dr. Jola Babinskilary removed pt's stitches. Bactroban was applied to area. Pt said she feels very self-conscious about what people will think when they look at her neck. Provided support/encouragement.
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
## 2018-07-09 NOTE — [facility] Group Notes (Signed)
[facility] Group Notes:  (Nursing)  Date:  07/09/2018  Time: 1:30 PM Type of Therapy:  Nurse Education  Participation Level:  Active  Participation Quality:  Appropriate  Affect:  Appropriate  Cognitive:  Appropriate  Insight:  Appropriate  Engagement in Group:  Engaged  Modes of Intervention:  Discussion and Education  Summary of Progress/Problems: Nurse led group played non-competitive learning/communication board game that fosters listening skills as well as self expression. Carla NevinValerie S Gilbert [name] 07/09/2018, 4:24 PM

## 2018-07-09 NOTE — Progress Notes (Signed)
D: Carla [name] denied SI, HI, and AVH today but reported poor sleep ("There were doors slamming all night which kept me from sleeping"). She has been polite and cooperative. She discussed whether to remove her stitches today and ultimately said she wanted to wait. Patient spoke with her brother, who spoke with this writer about ensuring the patient can be discharged by 0700 Tuesday. Patient is to fly from [place] to [place] so she can [facility]. Dr. Jola Babinskilary indicated that is feasible and that family could book the plane ticket. On her self inventory, she reported poor sleep ("none"), fair appetite, low energy level, and good concentration. She rated her depression 4/10 (ten being worst) and denied anxiety.  Her goal was "to try to get through the day despite no sleep."   A: Meds given as ordered. No PRNs requested or given. Q15 safety checks maintained. Support/encouragement offered. Encouraged patient to ask for earplugs, but she declined.   R: Pt remains free from harm and continues with treatment. Will continue to monitor for needs/safety.

## 2018-07-10 MED ORDER — QUETIAPINE FUMARATE 50 MG PO TABS
50.0000 mg | ORAL_TABLET | Freq: Every day | ORAL | Status: DC
  Administered 2018-07-10: 50 mg via ORAL
  Filled 2018-07-10 (×4): qty 1

## 2018-07-10 MED ORDER — RAMELTEON 8 MG PO TABS
8.0000 mg | ORAL_TABLET | Freq: Every day | ORAL | Status: DC
  Administered 2018-07-10 – 2018-07-11 (×2): 8 mg via ORAL
  Filled 2018-07-10 (×5): qty 1

## 2018-07-10 NOTE — Progress Notes (Signed)
Data: At 1130, patient presented with anxious and depressed mood. She requested 1:1 conversation with this Clinical research associatewriter and assigned nurse to discuss her feelings.  She was tearful as she expressed guilt over attempting to harm herself as well as regret over missing her son's upcoming birthday due to her imminent hospitalization in [place]. We spent time reframing these thoughts, encouraging her to see them as temporary and also encouraging her to examine the underlying physiological causes of her insomnia.  Patient reports  Her sons have been visiting her each night and are affirming how much she has meant to them as their mother. Patient complaint with scheduled medications. Patient denies pain/physical complaints. Patient completed self-inventory sheet and rates depression, hopelessness, and anxiety 4, 0, and 4 respectively. Patient rates their sleep and appetite as poor and fair respectively. Patient states goal for today is to "discuss her discharge plan with her doctor and family members". Patient is participating in groups and visible in the milieu. Patient currently denies SI/HI/AVH.   Action: Patient is educated about and provided medication per provider's orders. Patient safety maintained with q15 min safety checks and frequent rounding. Low fall risk precautions in place. Emotional support given. 1:1 interaction and active listening provided. Patient encouraged to attend meals, groups, and work on treatment plan and goals. Labs, vital signs and patient behavior monitored throughout shift.   Response: Patient remains safe on the unit at this time and agrees to come to staff with any issues/concerns. Patient is interacting with peers appropriately on the unit. Will continue to support and monitor.

## 2018-07-10 NOTE — [facility] Group Notes (Signed)
Adult Psychoeducational Group Note  Date:  07/10/2018 Time:  9:00 AM  Group Topic/Focus:  Goals Group:   The focus of this group is to help patients establish daily goals to achieve during treatment and discuss how the patient can incorporate goal setting into their daily lives to aide in recovery.  Participation Level:  Active  Participation Quality:  Appropriate  Affect:  Appropriate  Cognitive:  Alert  Insight: Appropriate  Engagement in Group:  Engaged  Modes of Intervention:  Orientation  Additional Comments:  Pt participated in goals/psycho-ed group session.  Sejal Cofield M Kareen Jefferys 07/10/2018, 9:00 AM 

## 2018-07-10 NOTE — Progress Notes (Signed)
Patient ID: Carla [name], female   DOB: 03/15/1961, 57 y.o.   MRN: [id number]  I attest to the previous note by Jonathon BellowsBrigman, Jennifer M, Student-RN (signed 9/22 5:18 PM). The student nurse has provided care under my supervision per [facility]. Patient was agreeable to working with Comptrollerstudent nurse.

## 2018-07-10 NOTE — Plan of Care (Signed)
  Problem: Activity: Goal: Interest or engagement in activities will improve Outcome: Progressing Patient visible and active in milieu.   Problem: Activity: Goal: Sleeping patterns will improve Outcome: Not Progressing Patient reports poor sleep last night. Trazadone added this evening.

## 2018-07-10 NOTE — Plan of Care (Signed)
  Problem: Education: Goal: Emotional status will improve Outcome: Progressing Pt. Has been active on the unit today, actively participating in groups and 1:1 with staff.    Problem: Coping: Goal: Ability to verbalize frustrations and anger appropriately will improve Outcome: Progressing  Pt. Spent speaking 1:1 with staff about her experiences and feelings.   Problem: Education: Goal: Utilization of techniques to improve thought processes will improve Outcome: Progressing  Discussed insomnia as a manifestation of anxiety disorder.   Problem: Activity: Goal: Interest or engagement in leisure activities will improve Outcome: Progressing  Pt. Was active continually in the milieu throughout the day.   Problem: Coping: Goal: Will verbalize feelings Outcome: Progressing  Pt. Spent speaking 1:1 with staff about her experiences and feelings.
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
## 2018-07-10 NOTE — [facility] Group Notes (Signed)
Adult Psychoeducational Group Note  Date:  07/10/2018 Time:  1315  Group Topic/Focus: Love Languages Healthy Communication:   The focus of this group is to discuss communication, barriers to communication, as well as healthy ways to communicate with others.  Participation Level:  Active  Participation Quality:  Attentive and Sharing  Affect:  Appropriate  Cognitive:  Appropriate  Insight: Appropriate  Engagement in Group:  Developing/Improving and Engaged  Modes of Intervention:  Discussion, Education, Exploration and Socialization  Additional Comments:  Patient commented that her love language is quality time, and reflected on her recent isolated behaviors.  Jonathon BellowsJennifer M Arnola Crittendon 07/10/2018, 2:49 PM

## 2018-07-10 NOTE — Progress Notes (Signed)
D: Patient observed up and visible in the milieu. Patient states she is leaving on Tuesday for treatment in [place] and feels good about her discharge plan. Also states her stitches were removed today from her neck and she is not experiencing pain. "I"m still embarrassed about that." Patient's affect anxious, worried with congruent mood. Denies pain, physical complaints.   A: Medicated per orders, prn trazadone given for sleep as patient states she did not sleep well last night. Also given ear plugs. Medication education provided. Level III obs in place for safety. Emotional support offered. Patient encouraged to complete Suicide Safety Plan before discharge. Encouraged to attend and participate in unit programming.  R: Patient verbalizes understanding of POC. On reassess, patient is sleepy. Patient denies SI/HI/AVH and remains safe on level III obs. Will continue to monitor throughout the night.
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
## 2018-07-10 NOTE — Progress Notes (Signed)
D    Pt is pleasant on approach and cooperative   She does endorse anxiety and depression  She also complains of little sleep    She attends and participates in groups and her behavior is appropriate   Her interactions are appropriate but minimal A   Discussed healthy sleeping habits with patient and discussed her medications regarding their contribution to helping her sleep    Administered medications and will evaluate effectiveness   Verbal support and encouragement given   Q 15 min checks R    Pt is safe at this time

## 2018-07-11 ENCOUNTER — Other Ambulatory Visit (HOSPITAL_COMMUNITY): Payer: BLUE CROSS/BLUE SHIELD

## 2018-07-11 MED ORDER — LEVOTHYROXINE SODIUM 125 MCG PO TABS: 125.0000 ug | ORAL_TABLET | Freq: Every day | ORAL | 0 refills | Status: DC

## 2018-07-11 MED ORDER — ARIPIPRAZOLE 5 MG PO TABS
5.0000 mg | ORAL_TABLET | Freq: Every day | ORAL | Status: DC
  Filled 2018-07-11: qty 1
  Filled 2018-07-11: qty 2

## 2018-07-11 MED ORDER — RAMELTEON 8 MG PO TABS: 8.0000 mg | ORAL_TABLET | Freq: Every day | ORAL | 0 refills | Status: AC

## 2018-07-11 MED ORDER — MIRTAZAPINE 30 MG PO TABS: 30.0000 mg | ORAL_TABLET | Freq: Every day | ORAL | 0 refills | Status: DC

## 2018-07-11 MED ORDER — ARIPIPRAZOLE 5 MG PO TABS: 5.0000 mg | ORAL_TABLET | Freq: Once | ORAL | 0 refills | Status: DC

## 2018-07-11 MED ORDER — QUETIAPINE FUMARATE 50 MG PO TABS: 50.0000 mg | ORAL_TABLET | Freq: Every day | ORAL | 0 refills | Status: DC

## 2018-07-11 MED ORDER — ARIPIPRAZOLE 2 MG PO TABS
2.0000 mg | ORAL_TABLET | Freq: Once | ORAL | Status: AC
  Administered 2018-07-11: 2 mg via ORAL
  Filled 2018-07-11 (×2): qty 1

## 2018-07-11 MED ORDER — GABAPENTIN 300 MG PO CAPS: 600.0000 mg | ORAL_CAPSULE | Freq: Every day | ORAL | 0 refills | Status: DC

## 2018-07-11 NOTE — [facility] Group Notes (Addendum)
[facility] LCSW Group Therapy Note  Date/Time: 07/11/18, 1315  Type of Therapy and Topic:  Group Therapy:  Overcoming Obstacles  Participation Level:  moderate  Description of Group:    In this group patients will be encouraged to explore what they see as obstacles to their own wellness and recovery. They will be guided to discuss their thoughts, feelings, and behaviors related to these obstacles. The group will process together ways to cope with barriers, with attention given to specific choices patients can make. Each patient will be challenged to identify changes they are motivated to make in order to overcome their obstacles. This group will be process-oriented, with patients participating in exploration of their own experiences as well as giving and receiving support and challenge from other group members.  Therapeutic Goals: 1. Patient will identify personal and current obstacles as they relate to admission. 2. Patient will identify barriers that currently interfere with their wellness or overcoming obstacles.  3. Patient will identify feelings, thought process and behaviors related to these barriers. 4. Patient will identify two changes they are willing to make to overcome these obstacles:    Summary of Patient Progress: Pt shared that grief/loss and medical issues are current obstacles.  Pt made several contributions to group discussion regarding positive ways to work towards overcoming obstacles.       Therapeutic Modalities:   Cognitive Behavioral Therapy Solution Focused Therapy Motivational Interviewing Relapse Prevention Therapy  Daleen SquibbGreg Hortencia Martire, LCSW
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
## 2018-07-11 NOTE — Plan of Care (Signed)
  Problem: Education: Goal: Emotional status will improve Outcome: Progressing Patient is engaged in activities on the floor and has more energy today.    Problem: Education: Goal: Mental status will improve Outcome: Progressing Patient is prioritizing her personal hygiene needs in anticipation of discharge.   Problem: Activity: Goal: Interest or engagement in activities will improve Outcome: Progressing Patient has been active with activities on the floor.   Problem: Activity: Goal: Sleeping patterns will improve Outcome: Progressing  Patient reported sleeping well last night!

## 2018-07-11 NOTE — B[facility] Group Notes (Signed)
Adult Psychoeducational Group Note  Date:  07/11/2018 Time:  8:57 AM  Group Topic/Focus:  Goals Group:   The focus of this group is to help patients establish daily goals to achieve during treatment and discuss how the patient can incorporate goal setting into their daily lives to aide in recovery.  Participation Level:  Active  Participation Quality:  Appropriate  Affect:  Appropriate  Cognitive:  Alert  Insight: Appropriate  Engagement in Group:  Engaged  Modes of Intervention:  Orientation  Additional Comments:   Pt attended orientation/goals group facilitated by MHT MacDilla  Aviyah Swetz M Faithanne Verret 07/11/2018, 8:57 AM 

## 2018-07-11 NOTE — Progress Notes (Signed)
Patient ID: Carla [name], female   DOB: 07/10/1961, 57 y.o.   MRN: [id number]  Patient belongings from locker and room were given to Tanda RockersJohn Singleton, patient's brother, per patient's request. All items were returned to family with no concerns. Written and verbal consent received from patient. Patient signed Belonging Sheet Form and placed on chart.

## 2018-07-11 NOTE — Progress Notes (Signed)
Patient ID: Carla [name], female   DOB: 11/23/1960, 57 y.o.   MRN: [id number]  I attest to the previous note by Jonathon BellowsBrigman, Jennifer M, Student-RN (signed 9/23 11:01 AM). The student nurse has provided care under my supervision per [facility]. Patient was agreeable to working with Comptrollerstudent nurse.

## 2018-07-11 NOTE — Progress Notes (Signed)
Data:Patient presents with anxious mood, particularly related to having sample medications [facility]. She is very concerned about whether or not the logistics of her discharge are being managed well, in terms of medication.  Patient complaint with scheduled medications. Patient denies pain/physical complaints. Patient completed self-inventory sheet and rates depression, hopelessness, and anxiety 4, 0, and 4 respectively. Patient rates their sleep and appetite asfair and fairrespectively. Patient states goal for today is to "go to groups, interact with people and work on her discharge plan. [facility]". Patient is participating in groups and visible in the milieu. Patient currently denies SI/HI/AVH.   Action:Patient is educated about and provided medication per provider's orders. Patient safety maintained with q15 min safety checks and frequent rounding. Lowfall risk precautions in place. Emotional support given. 1:1 interaction and active listening provided. Patient encouraged to attend meals, groups, and work on treatment plan and goals. Labs, vital signs and patient behavior monitored throughout shift.   Response: Patient remains safe on the unit at this time and agrees to come to staff with any issues/concerns. Patient is interacting with peers appropriately on the unit. Will continue to support and monitor.

## 2018-07-11 NOTE — [facility] Group Notes (Addendum)
Adult Psychoeducational Group Note  Date:  07/11/2018 Time:  4:00 PM  Group Topic/Focus: Orientation and Reflection Orientation and Reflection:   The focus of this group is to educate the patients on the purpose and policies of crisis stabilization and provide a format to answer questions about their admission.  The group details unit policies and expectations of patients while admitted. New patients were encouraged to ask questions. Other patients were asked to contribute something they have learned during their admission for the newer folks.  Participation Level:  Minimal  Participation Quality:  Limited  Affect:  Anxious  Cognitive:  Alert and Oriented  Insight: Improving  Engagement in Group:  Developing/Improving  Modes of Intervention:  Clarification, Discussion, Education, Rapport Building and Socialization  Additional Comments:  Patient attended group and was attentive. Patient did not share any thoughts with the group.  Marchelle Folksmanda A Tyquon Near 07/11/2018, 5:00 PM

## 2018-07-11 NOTE — Progress Notes (Signed)
Adult Psychoeducational Group Note  Date:  07/11/2018 Time:  8:55 AM  Group Topic/Focus:  Wrap-Up Group:   The focus of this group is to help patients review their daily goal of treatment and discuss progress on daily workbooks.  Participation Level:  Active  Participation Quality:  Appropriate  Affect:  Appropriate  Cognitive:  Appropriate  Insight: Appropriate and Good   Engagement in Group:  Engaged    Modes of Intervention:  Discussion  Additional Comments:  Chauncey FischerRobinson, Arlo Butt Long 07/11/2018, 8:55 AM

## 2018-07-11 NOTE — [facility] Suicide Risk Assessment (Signed)
[facility] INPATIENT:  Family/Significant Other Suicide Prevention Education  Suicide Prevention Education:  Education Completed; Carla AsalJohn [name], brother/Healthcare POA [phone]) has been identified by the patient as the family member/significant other with whom the patient will be residing, and identified as the person(s) who will aid the patient in the event of a mental health crisis (suicidal ideations/suicide attempt).  With written consent from the patient, the family member/significant other has been provided the following suicide prevention education, prior to the and/or following the discharge of the patient.  The suicide prevention education provided includes the following:  Suicide risk factors  Suicide prevention and interventions  National Suicide Hotline telephone number  [facility] assessment telephone number  [facility] Emergency Assistance 911  [facility] and/or Residential Mobile Crisis Unit telephone number  Request made of family/significant other to:  Remove weapons (e.g., guns, rifles, knives), all items previously/currently identified as safety concern.    Remove drugs/medications (over-the-counter, prescriptions, illicit drugs), all items previously/currently identified as a safety concern.  The family member/significant other verbalizes understanding of the suicide prevention education information provided.  The family member/significant other agrees to remove the items of safety concern listed above.  Carla [name] 07/11/2018, 4:02 PM

## 2018-07-11 NOTE — Progress Notes (Signed)
Recreation Therapy Notes  Date: 9.23.19 Time: 0930 Location: [address] Dayroom  Group Topic: Stress Management  Goal Area(s) Addresses:  Patient will verbalize importance of using healthy stress management.  Patient will identify positive emotions associated with healthy stress management.   Intervention: Stress Management  Activity :  Guided Imagery.  LRT introduced the stress management technique of guided imagery.  LRT read a script to guide patients on a journey to their peaceful place.  Patients were to follow along as script was read.  Education:  Stress Management, Discharge Planning.   Education Outcome: Acknowledges edcuation/In group clarification offered/Needs additional education  Clinical Observations/Feedback: Pt did not attend group.      Caroll RancherMarjette Bahja Bence, LRT/CTRS         Caroll RancherLindsay, Ginnie Marich A 07/11/2018 12:15 PM

## 2018-07-11 NOTE — Progress Notes (Addendum)
Patient ID: Carla [name], female   DOB: 07/24/1961, 57 y.o.   MRN: [id number]  Patient's family member was contacted per patient request. Carla [name], patient's sister in law, was provided with current list of medications. Written and verbal consent from patient obtained to discuss this information.  Patient's family was provided an updated list of medications. Patient's family was educated that patient would not be provided samples or a prescription of Valium or Percocet. Family verbalized understanding and reported they would provide patient with her home supply.

## 2018-07-11 NOTE — Progress Notes (Signed)
D    Pt continues to be anxious and nervous    She is making negative remarks   Saying I wont be able to sleep tonight    And worrying over her medications and any changes that have been made    Pt has agreed to wake up at 4am for discharge and her brother will pick her up ansd take her to the airport to go to rehab in [place] A    Verbal support given   Medications administered and effectiveness monitored   Q 15 min checks  R   Pt is safe at this time
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
## 2018-07-11 NOTE — Progress Notes (Signed)
  [facility] Discharge [facility] :  Will you be returning to the same living situation after discharge:  No. Patient is discharging to [facility] [place] residential treatment.  At discharge, do you have transportation home?: Yes,  patient is being picked up by her brother at discharge Do you have the ability to pay for your medications: Yes,  BCBS, personal income, support from family  Release of information consent forms completed and in the chart;  Patient's signature needed at discharge.  Patient to Follow up at: Follow-up Information    [facility] Follow up.   Why:  Patient reports she [facility] has a [facility] for long term residential treatment upon discharge from this hospitalization Contact information: [address], [place] [address]  [phone] [phone]          Next level of care provider has access to [facility]:yes  Safety Planning and Suicide Prevention discussed: Yes,  with the patient's brother  Have you used any form of tobacco in the last 30 days? (Cigarettes, Smokeless Tobacco, Cigars, and/or Pipes): No  Has patient been referred to the Quitline?: N/A patient is not a smoker  Patient has been referred for addiction treatment: N/A  Maeola SarahJolan E Nastasha Reising, LCSWA 07/11/2018, 4:10 PM

## 2018-07-12 ENCOUNTER — Encounter (HOSPITAL_COMMUNITY): Payer: Self-pay | Admitting: Family

## 2018-07-12 ENCOUNTER — Other Ambulatory Visit (HOSPITAL_COMMUNITY): Payer: BLUE CROSS/BLUE SHIELD

## 2018-07-12 DIAGNOSIS — R937 Abnormal findings on diagnostic imaging of other parts of musculoskeletal system: Secondary | ICD-10-CM | POA: Diagnosis not present

## 2018-07-12 DIAGNOSIS — F332 Major depressive disorder, recurrent severe without psychotic features: Secondary | ICD-10-CM | POA: Diagnosis not present

## 2018-07-12 DIAGNOSIS — F607 Dependent personality disorder: Secondary | ICD-10-CM | POA: Diagnosis not present

## 2018-07-12 DIAGNOSIS — R4689 Other symptoms and signs involving appearance and behavior: Secondary | ICD-10-CM | POA: Diagnosis not present

## 2018-07-12 DIAGNOSIS — F329 Major depressive disorder, single episode, unspecified: Secondary | ICD-10-CM | POA: Diagnosis not present

## 2018-07-12 DIAGNOSIS — J329 Chronic sinusitis, unspecified: Secondary | ICD-10-CM | POA: Diagnosis not present

## 2018-07-12 DIAGNOSIS — F411 Generalized anxiety disorder: Secondary | ICD-10-CM | POA: Diagnosis not present

## 2018-07-12 DIAGNOSIS — M47812 Spondylosis without myelopathy or radiculopathy, cervical region: Secondary | ICD-10-CM | POA: Diagnosis not present

## 2018-07-12 DIAGNOSIS — F333 Major depressive disorder, recurrent, severe with psychotic symptoms: Secondary | ICD-10-CM | POA: Diagnosis not present

## 2018-07-12 DIAGNOSIS — F419 Anxiety disorder, unspecified: Secondary | ICD-10-CM | POA: Diagnosis not present

## 2018-07-12 NOTE — Progress Notes (Signed)
Pt discharged left with all follow up information   Belongings returned to family 07/11/18    Pt denies SI/HI    Pt received sample medications   Pt mood affect and behavior are at baseline for this patient   Pt is being picked up for transport by her brother and is going [place] [employer] [place] and [place] to [facility] for further treatment   Pt did say she was not planning to take the Abilify medication as she said it makes her dizzy even after medication teaching and compliance encouragement was given

## 2018-07-13 ENCOUNTER — Other Ambulatory Visit (HOSPITAL_COMMUNITY): Payer: BLUE CROSS/BLUE SHIELD

## 2018-07-13 DIAGNOSIS — F329 Major depressive disorder, single episode, unspecified: Secondary | ICD-10-CM | POA: Diagnosis not present

## 2018-07-13 DIAGNOSIS — F332 Major depressive disorder, recurrent severe without psychotic features: Secondary | ICD-10-CM | POA: Diagnosis not present

## 2018-07-14 ENCOUNTER — Other Ambulatory Visit (HOSPITAL_COMMUNITY): Payer: BLUE CROSS/BLUE SHIELD

## 2018-07-14 DIAGNOSIS — F332 Major depressive disorder, recurrent severe without psychotic features: Secondary | ICD-10-CM | POA: Diagnosis not present

## 2018-07-14 DIAGNOSIS — F329 Major depressive disorder, single episode, unspecified: Secondary | ICD-10-CM | POA: Diagnosis not present

## 2018-07-14 DIAGNOSIS — F419 Anxiety disorder, unspecified: Secondary | ICD-10-CM | POA: Diagnosis not present

## 2018-07-15 ENCOUNTER — Other Ambulatory Visit (HOSPITAL_COMMUNITY): Payer: BLUE CROSS/BLUE SHIELD

## 2018-07-15 DIAGNOSIS — F419 Anxiety disorder, unspecified: Secondary | ICD-10-CM | POA: Diagnosis not present

## 2018-07-15 DIAGNOSIS — F329 Major depressive disorder, single episode, unspecified: Secondary | ICD-10-CM | POA: Diagnosis not present

## 2018-07-18 ENCOUNTER — Other Ambulatory Visit (HOSPITAL_COMMUNITY): Payer: BLUE CROSS/BLUE SHIELD

## 2018-07-18 DIAGNOSIS — F419 Anxiety disorder, unspecified: Secondary | ICD-10-CM | POA: Diagnosis not present

## 2018-07-18 DIAGNOSIS — F329 Major depressive disorder, single episode, unspecified: Secondary | ICD-10-CM | POA: Diagnosis not present

## 2018-07-18 DIAGNOSIS — F333 Major depressive disorder, recurrent, severe with psychotic symptoms: Secondary | ICD-10-CM | POA: Diagnosis not present

## 2018-07-19 ENCOUNTER — Other Ambulatory Visit (HOSPITAL_COMMUNITY): Payer: BLUE CROSS/BLUE SHIELD

## 2018-07-19 DIAGNOSIS — F329 Major depressive disorder, single episode, unspecified: Secondary | ICD-10-CM | POA: Diagnosis not present

## 2018-07-20 ENCOUNTER — Other Ambulatory Visit (HOSPITAL_COMMUNITY): Payer: BLUE CROSS/BLUE SHIELD

## 2018-07-20 DIAGNOSIS — F419 Anxiety disorder, unspecified: Secondary | ICD-10-CM | POA: Diagnosis not present

## 2018-07-20 DIAGNOSIS — F332 Major depressive disorder, recurrent severe without psychotic features: Secondary | ICD-10-CM | POA: Diagnosis not present

## 2018-07-20 DIAGNOSIS — F329 Major depressive disorder, single episode, unspecified: Secondary | ICD-10-CM | POA: Diagnosis not present

## 2018-07-20 DIAGNOSIS — F333 Major depressive disorder, recurrent, severe with psychotic symptoms: Secondary | ICD-10-CM | POA: Diagnosis not present

## 2018-07-21 ENCOUNTER — Other Ambulatory Visit (HOSPITAL_COMMUNITY): Payer: BLUE CROSS/BLUE SHIELD

## 2018-07-21 DIAGNOSIS — F329 Major depressive disorder, single episode, unspecified: Secondary | ICD-10-CM | POA: Diagnosis not present

## 2018-07-21 DIAGNOSIS — F419 Anxiety disorder, unspecified: Secondary | ICD-10-CM | POA: Diagnosis not present

## 2018-07-22 ENCOUNTER — Other Ambulatory Visit (HOSPITAL_COMMUNITY): Payer: BLUE CROSS/BLUE SHIELD

## 2018-07-22 DIAGNOSIS — F329 Major depressive disorder, single episode, unspecified: Secondary | ICD-10-CM | POA: Diagnosis not present

## 2018-07-25 ENCOUNTER — Other Ambulatory Visit (HOSPITAL_COMMUNITY): Payer: BLUE CROSS/BLUE SHIELD

## 2018-07-25 DIAGNOSIS — R937 Abnormal findings on diagnostic imaging of other parts of musculoskeletal system: Secondary | ICD-10-CM | POA: Diagnosis not present

## 2018-07-25 DIAGNOSIS — M47812 Spondylosis without myelopathy or radiculopathy, cervical region: Secondary | ICD-10-CM | POA: Diagnosis not present

## 2018-07-25 DIAGNOSIS — F329 Major depressive disorder, single episode, unspecified: Secondary | ICD-10-CM | POA: Diagnosis not present

## 2018-07-25 DIAGNOSIS — R4689 Other symptoms and signs involving appearance and behavior: Secondary | ICD-10-CM | POA: Diagnosis not present

## 2018-07-26 ENCOUNTER — Other Ambulatory Visit (HOSPITAL_COMMUNITY): Payer: BLUE CROSS/BLUE SHIELD

## 2018-07-26 DIAGNOSIS — F329 Major depressive disorder, single episode, unspecified: Secondary | ICD-10-CM | POA: Diagnosis not present

## 2018-07-26 DIAGNOSIS — F332 Major depressive disorder, recurrent severe without psychotic features: Secondary | ICD-10-CM | POA: Diagnosis not present

## 2018-07-27 ENCOUNTER — Other Ambulatory Visit (HOSPITAL_COMMUNITY): Payer: BLUE CROSS/BLUE SHIELD

## 2018-07-27 DIAGNOSIS — F329 Major depressive disorder, single episode, unspecified: Secondary | ICD-10-CM | POA: Diagnosis not present

## 2018-07-27 DIAGNOSIS — F419 Anxiety disorder, unspecified: Secondary | ICD-10-CM | POA: Diagnosis not present

## 2018-07-27 DIAGNOSIS — F332 Major depressive disorder, recurrent severe without psychotic features: Secondary | ICD-10-CM | POA: Diagnosis not present

## 2018-07-28 ENCOUNTER — Other Ambulatory Visit (HOSPITAL_COMMUNITY): Payer: BLUE CROSS/BLUE SHIELD

## 2018-07-28 DIAGNOSIS — F419 Anxiety disorder, unspecified: Secondary | ICD-10-CM | POA: Diagnosis not present

## 2018-07-28 DIAGNOSIS — F329 Major depressive disorder, single episode, unspecified: Secondary | ICD-10-CM | POA: Diagnosis not present

## 2018-07-29 ENCOUNTER — Other Ambulatory Visit (HOSPITAL_COMMUNITY): Payer: BLUE CROSS/BLUE SHIELD

## 2018-07-29 DIAGNOSIS — F332 Major depressive disorder, recurrent severe without psychotic features: Secondary | ICD-10-CM | POA: Diagnosis not present

## 2018-07-29 DIAGNOSIS — F329 Major depressive disorder, single episode, unspecified: Secondary | ICD-10-CM | POA: Diagnosis not present

## 2018-08-01 ENCOUNTER — Other Ambulatory Visit (HOSPITAL_COMMUNITY): Payer: BLUE CROSS/BLUE SHIELD

## 2018-08-01 DIAGNOSIS — F332 Major depressive disorder, recurrent severe without psychotic features: Secondary | ICD-10-CM | POA: Diagnosis not present

## 2018-08-01 DIAGNOSIS — F329 Major depressive disorder, single episode, unspecified: Secondary | ICD-10-CM | POA: Diagnosis not present

## 2018-08-01 DIAGNOSIS — F333 Major depressive disorder, recurrent, severe with psychotic symptoms: Secondary | ICD-10-CM | POA: Diagnosis not present

## 2018-08-02 ENCOUNTER — Other Ambulatory Visit (HOSPITAL_COMMUNITY): Payer: BLUE CROSS/BLUE SHIELD

## 2018-08-02 DIAGNOSIS — F332 Major depressive disorder, recurrent severe without psychotic features: Secondary | ICD-10-CM | POA: Diagnosis not present

## 2018-08-03 ENCOUNTER — Other Ambulatory Visit (HOSPITAL_COMMUNITY): Payer: BLUE CROSS/BLUE SHIELD

## 2018-08-03 DIAGNOSIS — F332 Major depressive disorder, recurrent severe without psychotic features: Secondary | ICD-10-CM | POA: Diagnosis not present

## 2018-08-03 DIAGNOSIS — F419 Anxiety disorder, unspecified: Secondary | ICD-10-CM | POA: Diagnosis not present

## 2018-08-03 DIAGNOSIS — F333 Major depressive disorder, recurrent, severe with psychotic symptoms: Secondary | ICD-10-CM | POA: Diagnosis not present

## 2018-08-03 DIAGNOSIS — F329 Major depressive disorder, single episode, unspecified: Secondary | ICD-10-CM | POA: Diagnosis not present

## 2018-08-04 ENCOUNTER — Other Ambulatory Visit (HOSPITAL_COMMUNITY): Payer: BLUE CROSS/BLUE SHIELD

## 2018-08-04 DIAGNOSIS — F333 Major depressive disorder, recurrent, severe with psychotic symptoms: Secondary | ICD-10-CM | POA: Diagnosis not present

## 2018-08-04 DIAGNOSIS — F329 Major depressive disorder, single episode, unspecified: Secondary | ICD-10-CM | POA: Diagnosis not present

## 2018-08-04 DIAGNOSIS — F419 Anxiety disorder, unspecified: Secondary | ICD-10-CM | POA: Diagnosis not present

## 2018-08-05 ENCOUNTER — Other Ambulatory Visit (HOSPITAL_COMMUNITY): Payer: BLUE CROSS/BLUE SHIELD

## 2018-08-05 DIAGNOSIS — F332 Major depressive disorder, recurrent severe without psychotic features: Secondary | ICD-10-CM | POA: Diagnosis not present

## 2018-08-08 ENCOUNTER — Other Ambulatory Visit (HOSPITAL_COMMUNITY): Payer: BLUE CROSS/BLUE SHIELD

## 2018-08-08 ENCOUNTER — Telehealth: Payer: Self-pay | Admitting: Neurology

## 2018-08-08 ENCOUNTER — Ambulatory Visit: Payer: BLUE CROSS/BLUE SHIELD | Admitting: Neurology

## 2018-08-08 DIAGNOSIS — F329 Major depressive disorder, single episode, unspecified: Secondary | ICD-10-CM | POA: Diagnosis not present

## 2018-08-08 DIAGNOSIS — F332 Major depressive disorder, recurrent severe without psychotic features: Secondary | ICD-10-CM | POA: Diagnosis not present

## 2018-08-08 DIAGNOSIS — F333 Major depressive disorder, recurrent, severe with psychotic symptoms: Secondary | ICD-10-CM | POA: Diagnosis not present

## 2018-08-08 NOTE — Telephone Encounter (Signed)
This patient did not show for a revisit appointment today. 

## 2018-08-09 ENCOUNTER — Encounter: Payer: Self-pay | Admitting: Neurology

## 2018-08-09 ENCOUNTER — Other Ambulatory Visit (HOSPITAL_COMMUNITY): Payer: BLUE CROSS/BLUE SHIELD

## 2018-08-09 DIAGNOSIS — F329 Major depressive disorder, single episode, unspecified: Secondary | ICD-10-CM | POA: Diagnosis not present

## 2018-08-10 ENCOUNTER — Other Ambulatory Visit (HOSPITAL_COMMUNITY): Payer: BLUE CROSS/BLUE SHIELD

## 2018-08-10 DIAGNOSIS — F333 Major depressive disorder, recurrent, severe with psychotic symptoms: Secondary | ICD-10-CM | POA: Diagnosis not present

## 2018-08-10 DIAGNOSIS — F329 Major depressive disorder, single episode, unspecified: Secondary | ICD-10-CM | POA: Diagnosis not present

## 2018-08-11 ENCOUNTER — Other Ambulatory Visit (HOSPITAL_COMMUNITY): Payer: BLUE CROSS/BLUE SHIELD

## 2018-08-11 DIAGNOSIS — F333 Major depressive disorder, recurrent, severe with psychotic symptoms: Secondary | ICD-10-CM | POA: Diagnosis not present

## 2018-08-11 DIAGNOSIS — F329 Major depressive disorder, single episode, unspecified: Secondary | ICD-10-CM | POA: Diagnosis not present

## 2018-08-11 DIAGNOSIS — F332 Major depressive disorder, recurrent severe without psychotic features: Secondary | ICD-10-CM | POA: Diagnosis not present

## 2018-08-12 ENCOUNTER — Other Ambulatory Visit (HOSPITAL_COMMUNITY): Payer: BLUE CROSS/BLUE SHIELD

## 2018-08-12 DIAGNOSIS — F329 Major depressive disorder, single episode, unspecified: Secondary | ICD-10-CM | POA: Diagnosis not present

## 2018-08-15 ENCOUNTER — Other Ambulatory Visit (HOSPITAL_COMMUNITY): Payer: BLUE CROSS/BLUE SHIELD

## 2018-08-15 DIAGNOSIS — F333 Major depressive disorder, recurrent, severe with psychotic symptoms: Secondary | ICD-10-CM | POA: Diagnosis not present

## 2018-08-15 DIAGNOSIS — F329 Major depressive disorder, single episode, unspecified: Secondary | ICD-10-CM | POA: Diagnosis not present

## 2018-08-17 DIAGNOSIS — F332 Major depressive disorder, recurrent severe without psychotic features: Secondary | ICD-10-CM | POA: Diagnosis not present

## 2018-08-17 DIAGNOSIS — F329 Major depressive disorder, single episode, unspecified: Secondary | ICD-10-CM | POA: Diagnosis not present

## 2018-08-17 DIAGNOSIS — F333 Major depressive disorder, recurrent, severe with psychotic symptoms: Secondary | ICD-10-CM | POA: Diagnosis not present

## 2018-08-17 DIAGNOSIS — F419 Anxiety disorder, unspecified: Secondary | ICD-10-CM | POA: Diagnosis not present

## 2018-08-18 DIAGNOSIS — F329 Major depressive disorder, single episode, unspecified: Secondary | ICD-10-CM | POA: Diagnosis not present

## 2018-08-18 DIAGNOSIS — F333 Major depressive disorder, recurrent, severe with psychotic symptoms: Secondary | ICD-10-CM | POA: Diagnosis not present

## 2018-08-18 DIAGNOSIS — F332 Major depressive disorder, recurrent severe without psychotic features: Secondary | ICD-10-CM | POA: Diagnosis not present

## 2018-08-18 DIAGNOSIS — F419 Anxiety disorder, unspecified: Secondary | ICD-10-CM | POA: Diagnosis not present

## 2018-08-19 DIAGNOSIS — F329 Major depressive disorder, single episode, unspecified: Secondary | ICD-10-CM | POA: Diagnosis not present

## 2018-08-19 DIAGNOSIS — F333 Major depressive disorder, recurrent, severe with psychotic symptoms: Secondary | ICD-10-CM | POA: Diagnosis not present

## 2018-08-22 DIAGNOSIS — F333 Major depressive disorder, recurrent, severe with psychotic symptoms: Secondary | ICD-10-CM | POA: Diagnosis not present

## 2018-08-22 DIAGNOSIS — F411 Generalized anxiety disorder: Secondary | ICD-10-CM | POA: Diagnosis not present

## 2018-08-22 DIAGNOSIS — F329 Major depressive disorder, single episode, unspecified: Secondary | ICD-10-CM | POA: Diagnosis not present

## 2018-08-24 DIAGNOSIS — F333 Major depressive disorder, recurrent, severe with psychotic symptoms: Secondary | ICD-10-CM | POA: Diagnosis not present

## 2018-08-24 DIAGNOSIS — F419 Anxiety disorder, unspecified: Secondary | ICD-10-CM | POA: Diagnosis not present

## 2018-08-24 DIAGNOSIS — F332 Major depressive disorder, recurrent severe without psychotic features: Secondary | ICD-10-CM | POA: Diagnosis not present

## 2018-08-24 DIAGNOSIS — F411 Generalized anxiety disorder: Secondary | ICD-10-CM | POA: Diagnosis not present

## 2018-08-24 DIAGNOSIS — F329 Major depressive disorder, single episode, unspecified: Secondary | ICD-10-CM | POA: Diagnosis not present

## 2018-08-25 DIAGNOSIS — F411 Generalized anxiety disorder: Secondary | ICD-10-CM | POA: Diagnosis not present

## 2018-08-25 DIAGNOSIS — F332 Major depressive disorder, recurrent severe without psychotic features: Secondary | ICD-10-CM | POA: Diagnosis not present

## 2018-08-25 DIAGNOSIS — F329 Major depressive disorder, single episode, unspecified: Secondary | ICD-10-CM | POA: Diagnosis not present

## 2018-08-25 DIAGNOSIS — F333 Major depressive disorder, recurrent, severe with psychotic symptoms: Secondary | ICD-10-CM | POA: Diagnosis not present

## 2018-08-26 DIAGNOSIS — F329 Major depressive disorder, single episode, unspecified: Secondary | ICD-10-CM | POA: Diagnosis not present

## 2018-08-26 DIAGNOSIS — F333 Major depressive disorder, recurrent, severe with psychotic symptoms: Secondary | ICD-10-CM | POA: Diagnosis not present

## 2018-08-29 DIAGNOSIS — F333 Major depressive disorder, recurrent, severe with psychotic symptoms: Secondary | ICD-10-CM | POA: Diagnosis not present

## 2018-08-29 DIAGNOSIS — F411 Generalized anxiety disorder: Secondary | ICD-10-CM | POA: Diagnosis not present

## 2018-08-29 DIAGNOSIS — F329 Major depressive disorder, single episode, unspecified: Secondary | ICD-10-CM | POA: Diagnosis not present

## 2018-08-31 DIAGNOSIS — F332 Major depressive disorder, recurrent severe without psychotic features: Secondary | ICD-10-CM | POA: Diagnosis not present

## 2018-08-31 DIAGNOSIS — F333 Major depressive disorder, recurrent, severe with psychotic symptoms: Secondary | ICD-10-CM | POA: Diagnosis not present

## 2018-08-31 DIAGNOSIS — F607 Dependent personality disorder: Secondary | ICD-10-CM | POA: Diagnosis not present

## 2018-08-31 DIAGNOSIS — F411 Generalized anxiety disorder: Secondary | ICD-10-CM | POA: Diagnosis not present

## 2018-09-01 DIAGNOSIS — F333 Major depressive disorder, recurrent, severe with psychotic symptoms: Secondary | ICD-10-CM | POA: Diagnosis not present

## 2018-09-01 DIAGNOSIS — F329 Major depressive disorder, single episode, unspecified: Secondary | ICD-10-CM | POA: Diagnosis not present

## 2018-09-01 DIAGNOSIS — F411 Generalized anxiety disorder: Secondary | ICD-10-CM | POA: Diagnosis not present

## 2018-09-01 DIAGNOSIS — F332 Major depressive disorder, recurrent severe without psychotic features: Secondary | ICD-10-CM | POA: Diagnosis not present

## 2018-09-01 DIAGNOSIS — F607 Dependent personality disorder: Secondary | ICD-10-CM | POA: Diagnosis not present

## 2018-09-02 ENCOUNTER — Telehealth (HOSPITAL_COMMUNITY): Payer: Self-pay | Admitting: Professional

## 2018-09-02 DIAGNOSIS — F607 Dependent personality disorder: Secondary | ICD-10-CM | POA: Diagnosis not present

## 2018-09-02 DIAGNOSIS — F329 Major depressive disorder, single episode, unspecified: Secondary | ICD-10-CM | POA: Diagnosis not present

## 2018-09-02 DIAGNOSIS — F333 Major depressive disorder, recurrent, severe with psychotic symptoms: Secondary | ICD-10-CM | POA: Diagnosis not present

## 2018-09-05 DIAGNOSIS — F329 Major depressive disorder, single episode, unspecified: Secondary | ICD-10-CM | POA: Diagnosis not present

## 2018-09-05 DIAGNOSIS — F333 Major depressive disorder, recurrent, severe with psychotic symptoms: Secondary | ICD-10-CM | POA: Diagnosis not present

## 2018-09-05 DIAGNOSIS — J329 Chronic sinusitis, unspecified: Secondary | ICD-10-CM | POA: Diagnosis not present

## 2018-09-05 DIAGNOSIS — F411 Generalized anxiety disorder: Secondary | ICD-10-CM | POA: Diagnosis not present

## 2018-09-07 DIAGNOSIS — F333 Major depressive disorder, recurrent, severe with psychotic symptoms: Secondary | ICD-10-CM | POA: Diagnosis not present

## 2018-09-07 DIAGNOSIS — F607 Dependent personality disorder: Secondary | ICD-10-CM | POA: Diagnosis not present

## 2018-09-07 DIAGNOSIS — F329 Major depressive disorder, single episode, unspecified: Secondary | ICD-10-CM | POA: Diagnosis not present

## 2018-09-07 DIAGNOSIS — F332 Major depressive disorder, recurrent severe without psychotic features: Secondary | ICD-10-CM | POA: Diagnosis not present

## 2018-09-07 DIAGNOSIS — F411 Generalized anxiety disorder: Secondary | ICD-10-CM | POA: Diagnosis not present

## 2018-09-08 DIAGNOSIS — F332 Major depressive disorder, recurrent severe without psychotic features: Secondary | ICD-10-CM | POA: Diagnosis not present

## 2018-09-08 DIAGNOSIS — F333 Major depressive disorder, recurrent, severe with psychotic symptoms: Secondary | ICD-10-CM | POA: Diagnosis not present

## 2018-09-09 DIAGNOSIS — F333 Major depressive disorder, recurrent, severe with psychotic symptoms: Secondary | ICD-10-CM | POA: Diagnosis not present

## 2018-09-09 DIAGNOSIS — F329 Major depressive disorder, single episode, unspecified: Secondary | ICD-10-CM | POA: Diagnosis not present

## 2018-09-12 DIAGNOSIS — F333 Major depressive disorder, recurrent, severe with psychotic symptoms: Secondary | ICD-10-CM | POA: Diagnosis not present

## 2018-09-12 DIAGNOSIS — F329 Major depressive disorder, single episode, unspecified: Secondary | ICD-10-CM | POA: Diagnosis not present

## 2018-09-14 DIAGNOSIS — F332 Major depressive disorder, recurrent severe without psychotic features: Secondary | ICD-10-CM | POA: Diagnosis not present

## 2018-09-14 DIAGNOSIS — F329 Major depressive disorder, single episode, unspecified: Secondary | ICD-10-CM | POA: Diagnosis not present

## 2018-09-14 DIAGNOSIS — F333 Major depressive disorder, recurrent, severe with psychotic symptoms: Secondary | ICD-10-CM | POA: Diagnosis not present

## 2018-09-14 DIAGNOSIS — F411 Generalized anxiety disorder: Secondary | ICD-10-CM | POA: Diagnosis not present

## 2018-09-14 DIAGNOSIS — F607 Dependent personality disorder: Secondary | ICD-10-CM | POA: Diagnosis not present

## 2018-09-15 DIAGNOSIS — F607 Dependent personality disorder: Secondary | ICD-10-CM | POA: Diagnosis not present

## 2018-09-15 DIAGNOSIS — F329 Major depressive disorder, single episode, unspecified: Secondary | ICD-10-CM | POA: Diagnosis not present

## 2018-09-19 DIAGNOSIS — F333 Major depressive disorder, recurrent, severe with psychotic symptoms: Secondary | ICD-10-CM | POA: Diagnosis not present

## 2018-09-19 DIAGNOSIS — F329 Major depressive disorder, single episode, unspecified: Secondary | ICD-10-CM | POA: Diagnosis not present

## 2018-09-20 DIAGNOSIS — F329 Major depressive disorder, single episode, unspecified: Secondary | ICD-10-CM | POA: Diagnosis not present

## 2018-09-20 DIAGNOSIS — F333 Major depressive disorder, recurrent, severe with psychotic symptoms: Secondary | ICD-10-CM | POA: Diagnosis not present

## 2018-09-21 ENCOUNTER — Other Ambulatory Visit (HOSPITAL_COMMUNITY): Payer: BLUE CROSS/BLUE SHIELD | Attending: Psychiatry | Admitting: Licensed Clinical Social Worker

## 2018-09-21 DIAGNOSIS — F411 Generalized anxiety disorder: Secondary | ICD-10-CM | POA: Insufficient documentation

## 2018-09-21 DIAGNOSIS — G4700 Insomnia, unspecified: Secondary | ICD-10-CM | POA: Diagnosis not present

## 2018-09-21 DIAGNOSIS — F329 Major depressive disorder, single episode, unspecified: Secondary | ICD-10-CM | POA: Diagnosis not present

## 2018-09-21 DIAGNOSIS — Z79899 Other long term (current) drug therapy: Secondary | ICD-10-CM | POA: Diagnosis not present

## 2018-09-21 DIAGNOSIS — F3341 Major depressive disorder, recurrent, in partial remission: Secondary | ICD-10-CM | POA: Insufficient documentation
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
## 2018-09-27 DIAGNOSIS — F331 Major depressive disorder, recurrent, moderate: Secondary | ICD-10-CM | POA: Diagnosis not present

## 2018-09-27 DIAGNOSIS — F411 Generalized anxiety disorder: Secondary | ICD-10-CM | POA: Diagnosis not present

## 2018-09-28 DIAGNOSIS — F3342 Major depressive disorder, recurrent, in full remission: Secondary | ICD-10-CM | POA: Diagnosis not present

## 2018-09-29 DIAGNOSIS — F4310 Post-traumatic stress disorder, unspecified: Secondary | ICD-10-CM | POA: Diagnosis not present

## 2018-09-29 DIAGNOSIS — F411 Generalized anxiety disorder: Secondary | ICD-10-CM | POA: Diagnosis not present

## 2018-10-04 DIAGNOSIS — F331 Major depressive disorder, recurrent, moderate: Secondary | ICD-10-CM | POA: Diagnosis not present

## 2018-10-04 DIAGNOSIS — F411 Generalized anxiety disorder: Secondary | ICD-10-CM | POA: Diagnosis not present

## 2018-10-05 DIAGNOSIS — F322 Major depressive disorder, single episode, severe without psychotic features: Secondary | ICD-10-CM | POA: Insufficient documentation

## 2018-10-05 DIAGNOSIS — F323 Major depressive disorder, single episode, severe with psychotic features: Secondary | ICD-10-CM | POA: Diagnosis not present

## 2018-10-06 DIAGNOSIS — R2689 Other abnormalities of gait and mobility: Secondary | ICD-10-CM | POA: Insufficient documentation

## 2018-10-21 DIAGNOSIS — F331 Major depressive disorder, recurrent, moderate: Secondary | ICD-10-CM | POA: Diagnosis not present

## 2018-10-24 DIAGNOSIS — F322 Major depressive disorder, single episode, severe without psychotic features: Secondary | ICD-10-CM | POA: Diagnosis not present

## 2018-10-24 DIAGNOSIS — Z79899 Other long term (current) drug therapy: Secondary | ICD-10-CM | POA: Diagnosis not present

## 2018-10-25 DIAGNOSIS — F411 Generalized anxiety disorder: Secondary | ICD-10-CM | POA: Diagnosis not present

## 2018-10-25 DIAGNOSIS — F331 Major depressive disorder, recurrent, moderate: Secondary | ICD-10-CM | POA: Diagnosis not present

## 2018-10-26 DIAGNOSIS — M8589 Other specified disorders of bone density and structure, multiple sites: Secondary | ICD-10-CM | POA: Diagnosis not present

## 2018-10-26 DIAGNOSIS — Z8262 Family history of osteoporosis: Secondary | ICD-10-CM | POA: Diagnosis not present

## 2018-10-28 ENCOUNTER — Ambulatory Visit: Payer: BLUE CROSS/BLUE SHIELD | Attending: Otolaryngology

## 2018-10-28 ENCOUNTER — Other Ambulatory Visit: Payer: Self-pay

## 2018-10-28 DIAGNOSIS — R42 Dizziness and giddiness: Secondary | ICD-10-CM | POA: Diagnosis not present

## 2018-10-28 DIAGNOSIS — R2689 Other abnormalities of gait and mobility: Secondary | ICD-10-CM | POA: Diagnosis not present

## 2018-10-28 DIAGNOSIS — R2681 Unsteadiness on feet: Secondary | ICD-10-CM | POA: Diagnosis not present
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
## 2018-11-01 DIAGNOSIS — F331 Major depressive disorder, recurrent, moderate: Secondary | ICD-10-CM | POA: Diagnosis not present

## 2018-11-01 DIAGNOSIS — F411 Generalized anxiety disorder: Secondary | ICD-10-CM | POA: Diagnosis not present

## 2018-11-03 ENCOUNTER — Ambulatory Visit: Payer: BLUE CROSS/BLUE SHIELD | Admitting: Physical Therapy

## 2018-11-03 ENCOUNTER — Encounter: Payer: Self-pay | Admitting: Physical Therapy

## 2018-11-03 DIAGNOSIS — R42 Dizziness and giddiness: Secondary | ICD-10-CM | POA: Diagnosis not present

## 2018-11-03 DIAGNOSIS — R2689 Other abnormalities of gait and mobility: Secondary | ICD-10-CM

## 2018-11-03 DIAGNOSIS — R2681 Unsteadiness on feet: Secondary | ICD-10-CM
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
## 2018-11-03 NOTE — Patient Instructions (Signed)
Habituation - Tip Card  1.The goal of habituation training is to assist in decreasing symptoms of vertigo, dizziness, or nausea provoked by specific head and body motions. 2.These exercises may initially increase symptoms; however, be persistent and work through symptoms. With repetition and time, the exercises will assist in reducing or eliminating symptoms. 3.Exercises should be stopped and discussed with the therapist if you experience any of the following: - Sudden change or fluctuation in hearing - New onset of ringing in the ears, or increase in current intensity - Any fluid discharge from the ear - Severe pain in neck or back - Extreme nausea   Habituation - Sit to Side-Lying   Sit on edge of bed. Lie down onto the right side and hold until dizziness stops, plus 20 seconds.  Return to sitting and wait until dizziness stops, plus 20 seconds.  Repeat to the left side. Repeat sequence 3 times per session. Do 2 sessions per day.   Gaze Stabilization - Tip Card  1.Target must remain in focus, not blurry, and appear stationary while head is in motion. 2.Perform exercises with small head movements (45 to either side of midline). 3.Increase speed of head motion so long as target is in focus. 4.If you wear eyeglasses, be sure you can see target through lens (therapist will give specific instructions for bifocal / progressive lenses). 5.These exercises may provoke dizziness or nausea. Work through these symptoms. If too dizzy, slow head movement slightly. Rest between each exercise. 6.Exercises demand concentration; avoid distractions. 7.For safety, perform standing exercises close to a counter, wall, corner, or next to someone.  Copyright  VHI. All rights reserved.   Gaze Stabilization - Standing Feet Apart   Feet shoulder width apart, keeping eyes on target on wall 3 feet away, tilt head down slightly and move head side to side for 60 seconds. Do 2-3 sessions per day.   Feet  Together, Head Motion - Eyes Closed      With eyes closed and feet together.  Hold your balance in the middle for 30 seconds. Repeat 2 times per session. Do 2 sessions per day.    Feet Heel-Toe "Tandem"    Arm outstretched near counter, walk a straight line bringing one foot directly in front of the other along counter and then backwards back to starting position.  Try not to look down. Repeat for 4 laps per session. Do __2__ sessions per day.

## 2018-11-07 DIAGNOSIS — M810 Age-related osteoporosis without current pathological fracture: Secondary | ICD-10-CM | POA: Diagnosis not present

## 2018-11-08 ENCOUNTER — Ambulatory Visit: Payer: BLUE CROSS/BLUE SHIELD

## 2018-11-10 ENCOUNTER — Ambulatory Visit: Payer: BLUE CROSS/BLUE SHIELD | Admitting: Neurology

## 2018-11-10 ENCOUNTER — Encounter: Payer: Self-pay | Admitting: Neurology

## 2018-11-10 ENCOUNTER — Ambulatory Visit: Payer: BLUE CROSS/BLUE SHIELD

## 2018-11-10 VITALS — BP 121/74 | HR 79 | Ht 64.0 in | Wt 147.0 lb

## 2018-11-10 DIAGNOSIS — R42 Dizziness and giddiness: Secondary | ICD-10-CM

## 2018-11-10 DIAGNOSIS — R269 Unspecified abnormalities of gait and mobility: Secondary | ICD-10-CM | POA: Diagnosis not present

## 2018-11-10 DIAGNOSIS — R2689 Other abnormalities of gait and mobility: Secondary | ICD-10-CM

## 2018-11-10 DIAGNOSIS — R2681 Unsteadiness on feet: Secondary | ICD-10-CM | POA: Diagnosis not present
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
## 2018-11-10 NOTE — Patient Instructions (Signed)
Habituation - Tip Card  1.The goal of habituation training is to assist in decreasing symptoms of vertigo, dizziness, or nausea provoked by specific head and body motions. 2.These exercises may initially increase symptoms; however, be persistent and work through symptoms. With repetition and time, the exercises will assist in reducing or eliminating symptoms. 3.Exercises should be stopped and discussed with the therapist if you experience any of the following: - Sudden change or fluctuation in hearing - New onset of ringing in the ears, or increase in current intensity - Any fluid discharge from the ear - Severe pain in neck or back - Extreme nausea   Habituation - Sit to Side-Lying   Sit on edge of bed. Lie down onto the right side and hold until dizziness stops, plus 20 seconds.  Return to sitting and wait until dizziness stops, plus 20 seconds.  Repeat to the left side. Repeat sequence 3 times per session. Do 2 sessions per day.   Gaze Stabilization - Tip Card  1.Target must remain in focus, not blurry, and appear stationary while head is in motion. 2.Perform exercises with small head movements (45 to either side of midline). 3.Increase speed of head motion so long as target is in focus. 4.If you wear eyeglasses, be sure you can see target through lens (therapist will give specific instructions for bifocal / progressive lenses). 5.These exercises may provoke dizziness or nausea. Work through these symptoms. If too dizzy, slow head movement slightly. Rest between each exercise. 6.Exercises demand concentration; avoid distractions. 7.For safety, perform standing exercises close to a counter, wall, corner, or next to someone.  Copyright  VHI. All rights reserved.   Gaze Stabilization - Standing Feet Apart   Feet shoulder width apart, keeping eyes on target on wall 3 feet away, tilt head down slightly and move head side to side for 60 seconds. Do 2-3 sessions per day.   Feet  Together, Head Motion - Eyes Closed      With eyes closed and feet together and move your head side to side 5 times/direction and up and down 5 times/direction.  Repeat 2 times per session. Do 2 sessions per day.    Feet Heel-Toe "Tandem"    Arms at your side as tolerated with one hand hovering close to counter for safety, walk a straight line bringing one foot directly in front of the other along counter and then backwards back to starting position.  Try not to look down. Repeat for 4 laps per session. Do __2__ sessions per day.
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
## 2018-11-14 DIAGNOSIS — Z791 Long term (current) use of non-steroidal anti-inflammatories (NSAID): Secondary | ICD-10-CM | POA: Diagnosis not present

## 2018-11-14 DIAGNOSIS — F411 Generalized anxiety disorder: Secondary | ICD-10-CM | POA: Diagnosis not present

## 2018-11-14 DIAGNOSIS — M1990 Unspecified osteoarthritis, unspecified site: Secondary | ICD-10-CM | POA: Diagnosis not present

## 2018-11-14 DIAGNOSIS — E039 Hypothyroidism, unspecified: Secondary | ICD-10-CM | POA: Diagnosis not present

## 2018-11-14 DIAGNOSIS — F4310 Post-traumatic stress disorder, unspecified: Secondary | ICD-10-CM | POA: Diagnosis not present

## 2018-11-14 DIAGNOSIS — Z79890 Hormone replacement therapy (postmenopausal): Secondary | ICD-10-CM | POA: Diagnosis not present

## 2018-11-14 DIAGNOSIS — F322 Major depressive disorder, single episode, severe without psychotic features: Secondary | ICD-10-CM | POA: Diagnosis not present

## 2018-11-14 DIAGNOSIS — Z79899 Other long term (current) drug therapy: Secondary | ICD-10-CM | POA: Diagnosis not present

## 2018-11-14 DIAGNOSIS — F333 Major depressive disorder, recurrent, severe with psychotic symptoms: Secondary | ICD-10-CM | POA: Diagnosis not present

## 2018-11-17 ENCOUNTER — Ambulatory Visit: Payer: BLUE CROSS/BLUE SHIELD | Admitting: Physical Therapy

## 2018-11-17 ENCOUNTER — Encounter: Payer: Self-pay | Admitting: Physical Therapy

## 2018-11-17 DIAGNOSIS — R42 Dizziness and giddiness: Secondary | ICD-10-CM | POA: Diagnosis not present

## 2018-11-17 DIAGNOSIS — R2681 Unsteadiness on feet: Secondary | ICD-10-CM

## 2018-11-17 DIAGNOSIS — R2689 Other abnormalities of gait and mobility: Secondary | ICD-10-CM | POA: Diagnosis not present
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
## 2018-11-17 NOTE — Patient Instructions (Signed)

## 2018-11-21 ENCOUNTER — Ambulatory Visit: Payer: BLUE CROSS/BLUE SHIELD | Attending: Otolaryngology | Admitting: Physical Therapy

## 2018-11-21 DIAGNOSIS — M542 Cervicalgia: Secondary | ICD-10-CM | POA: Diagnosis not present

## 2018-11-21 DIAGNOSIS — R42 Dizziness and giddiness: Secondary | ICD-10-CM | POA: Insufficient documentation

## 2018-11-21 DIAGNOSIS — R2689 Other abnormalities of gait and mobility: Secondary | ICD-10-CM | POA: Diagnosis not present

## 2018-11-21 DIAGNOSIS — R2681 Unsteadiness on feet: Secondary | ICD-10-CM | POA: Insufficient documentation

## 2018-11-22 DIAGNOSIS — M810 Age-related osteoporosis without current pathological fracture: Secondary | ICD-10-CM | POA: Diagnosis not present

## 2018-11-22 DIAGNOSIS — E559 Vitamin D deficiency, unspecified: Secondary | ICD-10-CM | POA: Diagnosis not present

## 2018-11-22 DIAGNOSIS — F411 Generalized anxiety disorder: Secondary | ICD-10-CM | POA: Diagnosis not present

## 2018-11-22 DIAGNOSIS — F331 Major depressive disorder, recurrent, moderate: Secondary | ICD-10-CM | POA: Diagnosis not present
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
## 2018-11-23 DIAGNOSIS — F4310 Post-traumatic stress disorder, unspecified: Secondary | ICD-10-CM | POA: Diagnosis not present

## 2018-11-23 DIAGNOSIS — F411 Generalized anxiety disorder: Secondary | ICD-10-CM | POA: Diagnosis not present

## 2018-11-23 DIAGNOSIS — F331 Major depressive disorder, recurrent, moderate: Secondary | ICD-10-CM | POA: Diagnosis not present

## 2018-11-24 ENCOUNTER — Ambulatory Visit: Payer: BLUE CROSS/BLUE SHIELD | Admitting: Physical Therapy

## 2018-11-24 DIAGNOSIS — R42 Dizziness and giddiness: Secondary | ICD-10-CM

## 2018-11-24 DIAGNOSIS — R2689 Other abnormalities of gait and mobility: Secondary | ICD-10-CM | POA: Diagnosis not present

## 2018-11-24 DIAGNOSIS — R2681 Unsteadiness on feet: Secondary | ICD-10-CM

## 2018-11-24 DIAGNOSIS — M542 Cervicalgia: Secondary | ICD-10-CM | POA: Diagnosis not present
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
## 2018-11-24 NOTE — Patient Instructions (Addendum)
  Gaze Stabilization - Tip Card  1.Target must remain in focus, not blurry, and appear stationary while head is in motion. 2.Perform exercises with small head movements (45 to either side of midline). 3.Increase speed of head motion so long as target is in focus. 4.If you wear eyeglasses, be sure you can see target through lens (therapist will give specific instructions for bifocal / progressive lenses). 5.These exercises may provoke dizziness or nausea. Work through these symptoms. If too dizzy, slow head movement slightly. Rest between each exercise. 6.Exercises demand concentration; avoid distractions. 7.For safety, perform standing exercises close to a counter, wall, corner, or next to someone.  Copyright  VHI. All rights reserved.   Gaze Stabilization - Standing Feet Apart   Feet shoulder width apart, keeping eyes on target on wall 3 feet away, tilt head down slightly and move head side to side for 60 seconds.  Head up and down for 60 seconds. Do 2-3 sessions per day.     Feet Together, Head Motion - Eyes Closed      With eyes closed and feet together and move your head side to side 5 times/direction and up and down 5 times/direction.  Repeat 2 times per session. Do 2 sessions per day.    Feet Heel-Toe "Tandem"    Arms at your side as tolerated with one hand hovering close to counter for safety, walk a straight line bringing one foot directly in front of the other along counter and then backwards back to starting position.  Try not to look down. Repeat for 4 laps per session. Do __2__ sessions per day.    Random Direction Head Motion   Perform with one hand touching counter top.  Walking on solid surface, move head and eyes right to left every __3__ steps.  2 laps forwards and backwards. 2 laps forwards and backwards of looking up and down every 3 steps. Do __2__ sessions per day. Repeat in dimly lit room.  Copyright  VHI. All rights reserved.

## 2018-11-28 DIAGNOSIS — H524 Presbyopia: Secondary | ICD-10-CM | POA: Diagnosis not present

## 2018-11-28 DIAGNOSIS — B0052 Herpesviral keratitis: Secondary | ICD-10-CM | POA: Diagnosis not present

## 2018-11-30 ENCOUNTER — Ambulatory Visit: Payer: BLUE CROSS/BLUE SHIELD | Admitting: Physical Therapy

## 2018-12-07 DIAGNOSIS — F419 Anxiety disorder, unspecified: Secondary | ICD-10-CM | POA: Diagnosis not present

## 2018-12-07 DIAGNOSIS — F4310 Post-traumatic stress disorder, unspecified: Secondary | ICD-10-CM | POA: Diagnosis not present

## 2018-12-07 DIAGNOSIS — Z79899 Other long term (current) drug therapy: Secondary | ICD-10-CM | POA: Diagnosis not present

## 2018-12-07 DIAGNOSIS — E039 Hypothyroidism, unspecified: Secondary | ICD-10-CM | POA: Diagnosis not present

## 2018-12-07 DIAGNOSIS — F322 Major depressive disorder, single episode, severe without psychotic features: Secondary | ICD-10-CM | POA: Diagnosis not present

## 2018-12-07 DIAGNOSIS — M1990 Unspecified osteoarthritis, unspecified site: Secondary | ICD-10-CM | POA: Diagnosis not present

## 2018-12-07 DIAGNOSIS — Z79890 Hormone replacement therapy (postmenopausal): Secondary | ICD-10-CM | POA: Diagnosis not present

## 2018-12-07 DIAGNOSIS — F339 Major depressive disorder, recurrent, unspecified: Secondary | ICD-10-CM | POA: Diagnosis not present

## 2018-12-09 ENCOUNTER — Encounter

## 2018-12-12 ENCOUNTER — Encounter: Payer: Self-pay | Admitting: Neurology

## 2018-12-12 ENCOUNTER — Ambulatory Visit: Payer: BLUE CROSS/BLUE SHIELD | Admitting: Neurology

## 2018-12-12 ENCOUNTER — Ambulatory Visit (INDEPENDENT_AMBULATORY_CARE_PROVIDER_SITE_OTHER): Payer: BLUE CROSS/BLUE SHIELD | Admitting: Neurology

## 2018-12-12 DIAGNOSIS — R2689 Other abnormalities of gait and mobility: Secondary | ICD-10-CM | POA: Diagnosis not present

## 2018-12-12 DIAGNOSIS — R269 Unspecified abnormalities of gait and mobility: Secondary | ICD-10-CM

## 2018-12-12 MED ORDER — GABAPENTIN 100 MG PO CAPS: ORAL_CAPSULE | ORAL | 3 refills | Status: DC

## 2018-12-12 NOTE — Procedures (Signed)
     HISTORY:  Carla [name] is a 58 year old patient with a history of feeling lightheaded and woozy, gait instability has been noticed.  The patient is not falling.  The sensation is better in the morning, worse as the day goes on, associated with a fullness sensation in the head.  The patient is being evaluated for a possible neuropathy as an etiology for her gait disorder.   NERVE CONDUCTION STUDIES:  Nerve conduction studies were performed on both lower extremities.  The distal motor latency for the right peroneal nerve was prolonged, with a low motor amplitude.  The distal motor latencies and motor amplitudes for the posterior tibial nerves and for the left peroneal nerve were normal.  The nerve conduction velocities were significantly slowed above and below the knee for the right peroneal nerve, normal for the left peroneal nerve and for the posterior tibial nerves bilaterally.  The sural and peroneal sensory latencies are normal bilaterally.  The F-wave latencies for the posterior tibial nerves are normal bilaterally.  EMG STUDIES:  EMG study was performed on the right lower extremity:  The tibialis anterior muscle reveals 2 to 4K motor units with full recruitment. No fibrillations or positive waves were seen. The peroneus tertius muscle reveals 2 to 4K motor units with full recruitment. No fibrillations or positive waves were seen. The medial gastrocnemius muscle reveals 1 to 3K motor units with full recruitment. No fibrillations or positive waves were seen. The vastus lateralis muscle reveals 2 to 4K motor units with full recruitment. No fibrillations or positive waves were seen. The iliopsoas muscle reveals 2 to 4K motor units with full recruitment. No fibrillations or positive waves were seen. The biceps femoris muscle (long head) reveals 2 to 4K motor units with full recruitment. No fibrillations or positive waves were seen. The lumbosacral paraspinal muscles were tested at 3  levels, and revealed no abnormalities of insertional activity at all 3 levels tested. There was good relaxation.   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  EMG evaluation of the right lower extremity was unremarkable, no evidence of an overlying lumbosacral radiculopathy was seen.  Marlan Palau MD 12/12/2018 1:53 PM  [facility] [address] [place], [place] [address]  Phone [phone] Fax [phone]

## 2018-12-12 NOTE — Progress Notes (Signed)
Please refer to EMG and nerve conduction procedure note.  

## 2018-12-12 NOTE — Progress Notes (Addendum)
The patient comes in for EMG nerve conduction study evaluation.  Nerve conduction studies showed distal dysfunction of the right peroneal nerve, otherwise no evidence of a peripheral neuropathy.  The patient reports ongoing problems with sensation of imbalance, she reports a fullness sensation in her head or a pressure sensation that is better in the morning, worse as the day goes on.  This parallels her sensation of imbalance.  The patient may have a muscle tension headache issue, she takes gabapentin 600 mg in the evening, we will add 100 mg twice during the day.  She will call for any dose adjustments.      MNC    Nerve / Sites Muscle Latency Ref. Amplitude Ref. Rel Amp Segments Distance Velocity Ref. Area    ms ms mV mV %  cm m/s m/s mVms  R Peroneal - EDB     Ankle EDB 7.0 ?6.5 0.7 ?2.0 100 Ankle - EDB 9   3.4     Fib head EDB 15.3  0.7  100 Fib head - Ankle 29 35 ?44 2.0     Pop fossa EDB 22.3  0.2  29.3 Pop fossa - Fib head 10 14 ?44 0.7         Pop fossa - Ankle      L Peroneal - EDB     Ankle EDB 5.4 ?6.5 5.4 ?2.0 100 Ankle - EDB 9   18.5     Fib head EDB 11.7  4.7  88.5 Fib head - Ankle 28 44 ?44 18.9     Pop fossa EDB 14.0  4.6  96.8 Pop fossa - Fib head 10 44 ?44 18.7         Pop fossa - Ankle      R Tibial - AH     Ankle AH 4.1 ?5.8 14.2 ?4.0 100 Ankle - AH 9   30.1     Pop fossa AH 13.1  11.1  78.6 Pop fossa - Ankle 37 41 ?41 26.3  L Tibial - AH     Ankle AH 4.0 ?5.8 14.8 ?4.0 100 Ankle - AH 9   33.6     Pop fossa AH 12.7  12.1  81.9 Pop fossa - Ankle 37 43 ?41 30.6             SNC    Nerve / Sites Rec. Site Peak Lat Ref.  Amp Ref. Segments Distance    ms ms V V  cm  R Sural - Ankle (Calf)     Calf Ankle 3.4 ?4.4 14 ?6 Calf - Ankle 14  L Sural - Ankle (Calf)     Calf Ankle 3.4 ?4.4 7 ?6 Calf - Ankle 14  R Superficial peroneal - Ankle     Lat leg Ankle 4.4 ?4.4 6 ?6 Lat leg - Ankle 14  L Superficial peroneal - Ankle     Lat leg Ankle 4.3 ?4.4 5 ?6 Lat leg -  Ankle 14              F  Wave    Nerve F Lat Ref.   ms ms  R Tibial - AH 55.6 ?56.0  L Tibial - AH 53.5 ?56.0

## 2018-12-13 DIAGNOSIS — F331 Major depressive disorder, recurrent, moderate: Secondary | ICD-10-CM | POA: Diagnosis not present

## 2018-12-13 DIAGNOSIS — F411 Generalized anxiety disorder: Secondary | ICD-10-CM | POA: Diagnosis not present

## 2018-12-14 ENCOUNTER — Ambulatory Visit: Payer: BLUE CROSS/BLUE SHIELD | Admitting: Physical Therapy

## 2018-12-14 ENCOUNTER — Encounter: Payer: Self-pay | Admitting: Physical Therapy

## 2018-12-14 DIAGNOSIS — M542 Cervicalgia: Secondary | ICD-10-CM | POA: Diagnosis not present

## 2018-12-14 DIAGNOSIS — R2689 Other abnormalities of gait and mobility: Secondary | ICD-10-CM

## 2018-12-14 DIAGNOSIS — R42 Dizziness and giddiness: Secondary | ICD-10-CM

## 2018-12-14 DIAGNOSIS — R2681 Unsteadiness on feet: Secondary | ICD-10-CM
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
## 2018-12-22 ENCOUNTER — Encounter: Payer: Self-pay | Admitting: Physical Therapy

## 2018-12-22 ENCOUNTER — Ambulatory Visit: Payer: BLUE CROSS/BLUE SHIELD | Admitting: Physical Therapy

## 2018-12-22 NOTE — Therapy (Signed)
[facility] [address] [place], [place], [address] Phone: [phone]   Fax:  [phone]  Patient Details  Name: ESTRELLITA LASKY MRN: [id number] Date of Birth: [birth year] Referring Provider:  No ref. provider found  Encounter Date: 12/22/2018 PHYSICAL THERAPY DISCHARGE SUMMARY  Visits from Start of Care: 7  Current functional level related to goals / functional outcomes: Unable to fully assess; pt called and requested to D/C from therapy at this time due to no change in her condition.  Did not feel therapy was helping.   Remaining deficits: Dizziness, impaired ROM, neck and shoulder pain   Education / Equipment: HEP  Plan: Patient agrees to discharge.  Patient goals were not met. Patient is being discharged due to the patient's request.  ?????     Rico Junker, PT, DPT 12/22/18    9:18 AM    [facility] [address] [place], [place], [address] Phone: [phone]   Fax:  [phone]

## 2018-12-23 ENCOUNTER — Ambulatory Visit: Payer: BLUE CROSS/BLUE SHIELD | Admitting: Physical Therapy

## 2018-12-26 ENCOUNTER — Ambulatory Visit: Payer: BLUE CROSS/BLUE SHIELD | Admitting: Physical Therapy

## 2018-12-30 ENCOUNTER — Encounter: Payer: Self-pay | Admitting: Physical Therapy

## 2019-01-02 ENCOUNTER — Emergency Department (HOSPITAL_COMMUNITY): Payer: BLUE CROSS/BLUE SHIELD

## 2019-01-02 ENCOUNTER — Emergency Department (HOSPITAL_COMMUNITY): Payer: BLUE CROSS/BLUE SHIELD | Admitting: Anesthesiology

## 2019-01-02 ENCOUNTER — Other Ambulatory Visit: Payer: Self-pay

## 2019-01-02 ENCOUNTER — Encounter (HOSPITAL_COMMUNITY): Payer: Self-pay

## 2019-01-02 ENCOUNTER — Observation Stay (HOSPITAL_COMMUNITY)
Admission: EM | Admit: 2019-01-02 | Discharge: 2019-01-04 | Disposition: A | Discharge status: Home / Self Care | Payer: BLUE CROSS/BLUE SHIELD | Attending: Surgery | Admitting: Surgery

## 2019-01-02 ENCOUNTER — Encounter (HOSPITAL_COMMUNITY)
Admission: EM | Disposition: A | Discharge status: Home / Self Care | Payer: Self-pay | Source: Home / Self Care | Attending: Emergency Medicine

## 2019-01-02 DIAGNOSIS — R1031 Right lower quadrant pain: Secondary | ICD-10-CM | POA: Diagnosis not present

## 2019-01-02 DIAGNOSIS — Z885 Allergy status to narcotic agent status: Secondary | ICD-10-CM | POA: Insufficient documentation

## 2019-01-02 DIAGNOSIS — Z79890 Hormone replacement therapy (postmenopausal): Secondary | ICD-10-CM | POA: Insufficient documentation

## 2019-01-02 DIAGNOSIS — F329 Major depressive disorder, single episode, unspecified: Secondary | ICD-10-CM | POA: Insufficient documentation

## 2019-01-02 DIAGNOSIS — Z915 Personal history of self-harm: Secondary | ICD-10-CM | POA: Insufficient documentation

## 2019-01-02 DIAGNOSIS — M1990 Unspecified osteoarthritis, unspecified site: Secondary | ICD-10-CM | POA: Diagnosis not present

## 2019-01-02 DIAGNOSIS — E039 Hypothyroidism, unspecified: Secondary | ICD-10-CM | POA: Diagnosis not present

## 2019-01-02 DIAGNOSIS — Z881 Allergy status to other antibiotic agents status: Secondary | ICD-10-CM | POA: Insufficient documentation

## 2019-01-02 DIAGNOSIS — K3580 Unspecified acute appendicitis: Secondary | ICD-10-CM | POA: Diagnosis not present

## 2019-01-02 DIAGNOSIS — R1084 Generalized abdominal pain: Secondary | ICD-10-CM | POA: Diagnosis not present

## 2019-01-02 DIAGNOSIS — K353 Acute appendicitis with localized peritonitis, without perforation or gangrene: Secondary | ICD-10-CM | POA: Diagnosis not present

## 2019-01-02 DIAGNOSIS — R1110 Vomiting, unspecified: Secondary | ICD-10-CM | POA: Diagnosis not present

## 2019-01-02 DIAGNOSIS — Z79899 Other long term (current) drug therapy: Secondary | ICD-10-CM | POA: Insufficient documentation

## 2019-01-02 DIAGNOSIS — R0902 Hypoxemia: Secondary | ICD-10-CM | POA: Diagnosis not present

## 2019-01-02 HISTORY — PX: LAPAROSCOPIC APPENDECTOMY: SHX408

## 2019-01-02 LAB — COMPREHENSIVE METABOLIC PANEL
ALT: 19 U/L (ref 0–44)
AST: 19 U/L (ref 15–41)
Albumin: 4.1 g/dL (ref 3.5–5.0)
Alkaline Phosphatase: 63 U/L (ref 38–126)
Anion gap: 10 (ref 5–15)
BUN: 9 mg/dL (ref 6–20)
CO2: 27 mmol/L (ref 22–32)
Calcium: 8.4 mg/dL — ABNORMAL LOW (ref 8.9–10.3)
Chloride: 99 mmol/L (ref 98–111)
Creatinine, Ser: 0.75 mg/dL (ref 0.44–1.00)
GFR calc Af Amer: >60 mL/min (ref >60)
GFR calc non Af Amer: >60 mL/min (ref >60)
Glucose, Bld: 115 mg/dL — ABNORMAL HIGH (ref 70–99)
Potassium: 4.1 mmol/L (ref 3.5–5.1)
Sodium: 136 mmol/L (ref 135–145)
Total Bilirubin: 0.6 mg/dL (ref 0.3–1.2)
Total Protein: 7.5 g/dL (ref 6.5–8.1)

## 2019-01-02 LAB — CBC
HCT: 38.2 % (ref 36.0–46.0)
Hemoglobin: 12.3 g/dL (ref 12.0–15.0)
MCH: 31 pg (ref 26.0–34.0)
MCHC: 32.2 g/dL (ref 30.0–36.0)
MCV: 96.2 fL (ref 80.0–100.0)
Platelets: 189 10*3/uL (ref 150–400)
RBC: 3.97 MIL/uL (ref 3.87–5.11)
RDW: 13.3 % (ref 11.5–15.5)
WBC: 8.2 10*3/uL (ref 4.0–10.5)
nRBC: 0 % (ref 0.0–0.2)

## 2019-01-02 LAB — URINALYSIS, ROUTINE W REFLEX MICROSCOPIC
Bilirubin Urine: NEGATIVE
Glucose, UA: NEGATIVE mg/dL
Hgb urine dipstick: NEGATIVE
Ketones, ur: NEGATIVE mg/dL
Leukocytes,Ua: NEGATIVE
Nitrite: NEGATIVE
Protein, ur: NEGATIVE mg/dL
Specific Gravity, Urine: 1.01 (ref 1.005–1.030)
pH: 8 (ref 5.0–8.0)

## 2019-01-02 LAB — LIPASE, BLOOD: Lipase: 27 U/L (ref 11–51)

## 2019-01-02 SURGERY — APPENDECTOMY, LAPAROSCOPIC
Anesthesia: General | Site: Abdomen

## 2019-01-02 MED ORDER — LACTATED RINGERS IR SOLN
Status: DC | PRN
  Administered 2019-01-02: 1000 mL

## 2019-01-02 MED ORDER — PIPERACILLIN-TAZOBACTAM 3.375 G IVPB 30 MIN
3.3750 g | INTRAVENOUS | Status: AC
  Administered 2019-01-02: 3.375 g via INTRAVENOUS
  Filled 2019-01-02: qty 50

## 2019-01-02 MED ORDER — KETAMINE HCL 10 MG/ML IJ SOLN
INTRAMUSCULAR | Status: AC
  Filled 2019-01-02: qty 1

## 2019-01-02 MED ORDER — BUPIVACAINE-EPINEPHRINE 0.25% -1:200000 IJ SOLN
INTRAMUSCULAR | Status: DC | PRN
  Administered 2019-01-02: 30 mL

## 2019-01-02 MED ORDER — PROPOFOL 10 MG/ML IV BOLUS
INTRAVENOUS | Status: DC | PRN
  Administered 2019-01-02: 50 mg via INTRAVENOUS
  Administered 2019-01-02: 170 mg via INTRAVENOUS
  Administered 2019-01-02: 30 mg via INTRAVENOUS

## 2019-01-02 MED ORDER — PIPERACILLIN-TAZOBACTAM 4.5 G IVPB: 4.5000 g | Freq: Once | INTRAVENOUS | Status: DC

## 2019-01-02 MED ORDER — IOPAMIDOL (ISOVUE-300) INJECTION 61%
100.0000 mL | Freq: Once | INTRAVENOUS | Status: AC | PRN
  Administered 2019-01-02: 100 mL via INTRAVENOUS

## 2019-01-02 MED ORDER — PROPOFOL 10 MG/ML IV BOLUS
INTRAVENOUS | Status: AC
  Filled 2019-01-02: qty 20

## 2019-01-02 MED ORDER — FENTANYL CITRATE (PF) 100 MCG/2ML IJ SOLN
INTRAMUSCULAR | Status: DC | PRN
  Administered 2019-01-02: 50 ug via INTRAVENOUS
  Administered 2019-01-02 (×2): 100 ug via INTRAVENOUS

## 2019-01-02 MED ORDER — DEXAMETHASONE SODIUM PHOSPHATE 10 MG/ML IJ SOLN
INTRAMUSCULAR | Status: DC | PRN
  Administered 2019-01-02: 5 mg via INTRAVENOUS

## 2019-01-02 MED ORDER — LACTATED RINGERS IV SOLN
INTRAVENOUS | Status: DC
  Administered 2019-01-02 (×3): via INTRAVENOUS

## 2019-01-02 MED ORDER — SODIUM CHLORIDE (PF) 0.9 % IJ SOLN
INTRAMUSCULAR | Status: AC
  Filled 2019-01-02: qty 50

## 2019-01-02 MED ORDER — SODIUM CHLORIDE 0.9% FLUSH
3.0000 mL | Freq: Once | INTRAVENOUS | Status: AC
  Administered 2019-01-02: 3 mL via INTRAVENOUS

## 2019-01-02 MED ORDER — MIDAZOLAM HCL 5 MG/5ML IJ SOLN
INTRAMUSCULAR | Status: DC | PRN
  Administered 2019-01-02: 2 mg via INTRAVENOUS

## 2019-01-02 MED ORDER — IOPAMIDOL (ISOVUE-300) INJECTION 61%
INTRAVENOUS | Status: AC
  Filled 2019-01-02: qty 100

## 2019-01-02 MED ORDER — BUPIVACAINE-EPINEPHRINE (PF) 0.25% -1:200000 IJ SOLN
INTRAMUSCULAR | Status: AC
  Filled 2019-01-02: qty 30

## 2019-01-02 MED ORDER — ARTIFICIAL TEARS OPHTHALMIC OINT
TOPICAL_OINTMENT | OPHTHALMIC | Status: AC
  Filled 2019-01-02: qty 3.5

## 2019-01-02 MED ORDER — FENTANYL CITRATE (PF) 250 MCG/5ML IJ SOLN
INTRAMUSCULAR | Status: AC
  Filled 2019-01-02: qty 5

## 2019-01-02 MED ORDER — 0.9 % SODIUM CHLORIDE (POUR BTL) OPTIME
TOPICAL | Status: DC | PRN
  Administered 2019-01-02: 1000 mL

## 2019-01-02 MED ORDER — ROCURONIUM BROMIDE 10 MG/ML (PF) SYRINGE
PREFILLED_SYRINGE | INTRAVENOUS | Status: DC | PRN
  Administered 2019-01-02: 40 mg via INTRAVENOUS

## 2019-01-02 MED ORDER — DEXAMETHASONE SODIUM PHOSPHATE 10 MG/ML IJ SOLN
INTRAMUSCULAR | Status: AC
  Filled 2019-01-02: qty 1

## 2019-01-02 MED ORDER — ONDANSETRON HCL 4 MG/2ML IJ SOLN
INTRAMUSCULAR | Status: AC
  Filled 2019-01-02: qty 2

## 2019-01-02 MED ORDER — SUCCINYLCHOLINE CHLORIDE 200 MG/10ML IV SOSY
PREFILLED_SYRINGE | INTRAVENOUS | Status: DC | PRN
  Administered 2019-01-02: 120 mg via INTRAVENOUS

## 2019-01-02 MED ORDER — SODIUM CHLORIDE 0.9 % IV BOLUS
1000.0000 mL | Freq: Once | INTRAVENOUS | Status: AC
  Administered 2019-01-02: 1000 mL via INTRAVENOUS

## 2019-01-02 MED ORDER — ONDANSETRON HCL 4 MG/2ML IJ SOLN
4.0000 mg | Freq: Once | INTRAMUSCULAR | Status: AC
  Administered 2019-01-02: 4 mg via INTRAVENOUS
  Filled 2019-01-02: qty 2

## 2019-01-02 MED ORDER — MIDAZOLAM HCL 2 MG/2ML IJ SOLN
INTRAMUSCULAR | Status: AC
  Filled 2019-01-02: qty 2

## 2019-01-02 MED ORDER — FENTANYL CITRATE (PF) 100 MCG/2ML IJ SOLN
50.0000 ug | Freq: Once | INTRAMUSCULAR | Status: AC
  Administered 2019-01-02: 50 ug via INTRAVENOUS
  Filled 2019-01-02: qty 2

## 2019-01-02 MED ORDER — KETAMINE HCL 10 MG/ML IJ SOLN
INTRAMUSCULAR | Status: DC | PRN
  Administered 2019-01-02: 10 mg via INTRAVENOUS
  Administered 2019-01-02: 30 mg via INTRAVENOUS

## 2019-01-02 MED ORDER — ONDANSETRON HCL 4 MG/2ML IJ SOLN
INTRAMUSCULAR | Status: DC | PRN
  Administered 2019-01-02: 4 mg via INTRAVENOUS

## 2019-01-02 MED ORDER — LIDOCAINE 2% (20 MG/ML) 5 ML SYRINGE
INTRAMUSCULAR | Status: DC | PRN
  Administered 2019-01-02: 75 mg via INTRAVENOUS
  Administered 2019-01-02: 25 mg via INTRAVENOUS

## 2019-01-02 SURGICAL SUPPLY — 42 items
APPLIER CLIP ROT 10 11.4 M/L (STAPLE)
CABLE HIGH FREQUENCY MONO STRZ (ELECTRODE) IMPLANT
CHLORAPREP W/TINT 26ML (MISCELLANEOUS) ×2 IMPLANT
CLIP APPLIE ROT 10 11.4 M/L (STAPLE) IMPLANT
COVER SURGICAL LIGHT HANDLE (MISCELLANEOUS) ×2 IMPLANT
COVER WAND RF STERILE (DRAPES) IMPLANT
CUTTER FLEX LINEAR 45M (STAPLE) ×2 IMPLANT
DECANTER SPIKE VIAL GLASS SM (MISCELLANEOUS) ×2 IMPLANT
DERMABOND ADVANCED (GAUZE/BANDAGES/DRESSINGS) ×1
DERMABOND ADVANCED .7 DNX12 (GAUZE/BANDAGES/DRESSINGS) ×1 IMPLANT
ELECT REM PT RETURN 15FT ADLT (MISCELLANEOUS) ×2 IMPLANT
ENDOLOOP SUT PDS II  0 18 (SUTURE)
ENDOLOOP SUT PDS II 0 18 (SUTURE) IMPLANT
GLOVE BIO SURGEON STRL SZ8 (GLOVE) ×2 IMPLANT
GLOVE BIOGEL PI IND STRL 7.5 (GLOVE) ×1 IMPLANT
GLOVE BIOGEL PI IND STRL 8 (GLOVE) ×1 IMPLANT
GLOVE BIOGEL PI INDICATOR 7.5 (GLOVE) ×1
GLOVE BIOGEL PI INDICATOR 8 (GLOVE) ×1
GLOVE SURG SIGNA 7.5 PF LTX (GLOVE) ×2 IMPLANT
GOWN STRL REUS W/TWL XL LVL3 (GOWN DISPOSABLE) ×4 IMPLANT
KIT BASIN OR (CUSTOM PROCEDURE TRAY) ×2 IMPLANT
KIT TURNOVER KIT A (KITS) IMPLANT
POUCH RETRIEVAL ECOSAC 10 (ENDOMECHANICALS) IMPLANT
POUCH RETRIEVAL ECOSAC 10MM (ENDOMECHANICALS)
POUCH SPECIMEN RETRIEVAL 10MM (ENDOMECHANICALS) IMPLANT
RELOAD 45 THICK GREEN (ENDOMECHANICALS) ×2 IMPLANT
RELOAD 45 VASCULAR/THIN (ENDOMECHANICALS) IMPLANT
SCISSORS LAP 5X35 DISP (ENDOMECHANICALS) ×2 IMPLANT
SET IRRIG TUBING LAPAROSCOPIC (IRRIGATION / IRRIGATOR) ×2 IMPLANT
SET TUBE SMOKE EVAC HIGH FLOW (TUBING) ×2 IMPLANT
SHEARS HARMONIC ACE PLUS 36CM (ENDOMECHANICALS) ×2 IMPLANT
SLEEVE XCEL OPT CAN 5 100 (ENDOMECHANICALS) ×2 IMPLANT
SUT MNCRL AB 4-0 PS2 18 (SUTURE) ×2 IMPLANT
SUT VIC AB 2-0 SH 18 (SUTURE) ×4 IMPLANT
SUT VIC AB 2-0 SH 27 (SUTURE) ×1
SUT VIC AB 2-0 SH 27X BRD (SUTURE) ×1 IMPLANT
SUT VICRYL 0 UR6 27IN ABS (SUTURE) ×2 IMPLANT
TOWEL OR 17X26 10 PK STRL BLUE (TOWEL DISPOSABLE) ×2 IMPLANT
TOWEL OR NON WOVEN STRL DISP B (DISPOSABLE) ×2 IMPLANT
TRAY LAPAROSCOPIC (CUSTOM PROCEDURE TRAY) ×2 IMPLANT
TROCAR BLADELESS OPT 5 100 (ENDOMECHANICALS) ×2 IMPLANT
TROCAR XCEL BLUNT TIP 100MML (ENDOMECHANICALS) ×2 IMPLANT

## 2019-01-02 NOTE — ED Notes (Signed)
Patient has been transported for OR by staff.

## 2019-01-02 NOTE — ED Triage Notes (Signed)
Per EMS- Patient is from physician's office. Patient c/o RLQ pain x 3 days. Patient reported that she has taken laxatives and antiacids with no relief. Temp-100.2 at physician's office
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
## 2019-01-02 NOTE — Anesthesia Preprocedure Evaluation (Signed)
Anesthesia Evaluation  Patient identified by MRN, date of birth, ID band Patient awake    Reviewed: Allergy & Precautions, NPO status , Patient's Chart, lab work & pertinent test results, reviewed documented beta blocker date and time   Airway Mallampati: II   Neck ROM: Full    Dental  (+) Teeth Intact, Dental Advisory Given   Pulmonary neg pulmonary ROS,    breath sounds clear to auscultation       Cardiovascular negative cardio ROS   Rhythm:Regular     Neuro/Psych Anxiety Depression negative neurological ROS     GI/Hepatic Neg liver ROS,   Endo/Other  Hypothyroidism   Renal/GU negative Renal ROS     Musculoskeletal  (+) Arthritis ,   Abdominal (+)  Abdomen: soft.    Peds  Hematology   Anesthesia Other Findings   Reproductive/Obstetrics                             Anesthesia Physical  Anesthesia Plan  ASA: II and emergent  Anesthesia Plan: General   Post-op Pain Management:    Induction: Intravenous, Rapid sequence and Cricoid pressure planned  PONV Risk Score and Plan: 4 or greater and Ondansetron, Dexamethasone, Midazolam, Scopolamine patch - Pre-op and Treatment may vary due to age or medical condition  Airway Management Planned: Oral ETT  Additional Equipment: None  Intra-op Plan:   Post-operative Plan: Extubation in OR  Informed Consent: I have reviewed the patients History and Physical, chart, labs and discussed the procedure including the risks, benefits and alternatives for the proposed anesthesia with the patient or authorized representative who has indicated his/her understanding and acceptance.       Plan Discussed with:   Anesthesia Plan Comments: (Check am labs)        Anesthesia Quick Evaluation

## 2019-01-02 NOTE — ED Notes (Signed)
Informed Dr. Lockie Mola that patient is requesting additional information via secure chat.

## 2019-01-02 NOTE — Anesthesia Procedure Notes (Signed)
Procedure Name: Intubation Date/Time: 01/02/2019 11:08 PM Performed by: Lissa Morales, CRNA Pre-anesthesia Checklist: Patient identified, Emergency Drugs available, Suction available and Patient being monitored Patient Re-evaluated:Patient Re-evaluated prior to induction Oxygen Delivery Method: Circle system utilized Preoxygenation: Pre-oxygenation with 100% oxygen Induction Type: IV induction, Cricoid Pressure applied and Rapid sequence Ventilation: Mask ventilation without difficulty Laryngoscope Size: Mac and 3 Grade View: Grade II Tube type: Oral Tube size: 7.0 mm Number of attempts: 1 Airway Equipment and Method: Stylet and Oral airway Placement Confirmation: ETT inserted through vocal cords under direct vision,  positive ETCO2 and breath sounds checked- equal and bilateral Secured at: 22 cm Tube secured with: Tape Dental Injury: Teeth and Oropharynx as per pre-operative assessment
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
## 2019-01-03 ENCOUNTER — Encounter (HOSPITAL_COMMUNITY): Payer: Self-pay | Admitting: Surgery

## 2019-01-03 DIAGNOSIS — K3580 Unspecified acute appendicitis: Secondary | ICD-10-CM | POA: Diagnosis not present

## 2019-01-03 MED ORDER — ENOXAPARIN SODIUM 40 MG/0.4ML ~~LOC~~ SOLN
40.0000 mg | SUBCUTANEOUS | Status: DC
  Administered 2019-01-04: 40 mg via SUBCUTANEOUS
  Filled 2019-01-03: qty 0.4

## 2019-01-03 MED ORDER — RAMELTEON 8 MG PO TABS: 8.0000 mg | ORAL_TABLET | Freq: Every day | ORAL | Status: DC

## 2019-01-03 MED ORDER — ONDANSETRON 4 MG PO TBDP: 4.0000 mg | ORAL_TABLET | Freq: Four times a day (QID) | ORAL | Status: DC | PRN

## 2019-01-03 MED ORDER — MIRTAZAPINE 15 MG PO TABS: 7.5000 mg | ORAL_TABLET | Freq: Every day | ORAL | Status: DC

## 2019-01-03 MED ORDER — DOCUSATE SODIUM 100 MG PO CAPS
100.0000 mg | ORAL_CAPSULE | Freq: Two times a day (BID) | ORAL | Status: DC
  Administered 2019-01-03 – 2019-01-04 (×3): 100 mg via ORAL
  Filled 2019-01-03 (×3): qty 1

## 2019-01-03 MED ORDER — TRAMADOL HCL 50 MG PO TABS: 50.0000 mg | ORAL_TABLET | Freq: Four times a day (QID) | ORAL | Status: DC | PRN

## 2019-01-03 MED ORDER — SUGAMMADEX SODIUM 200 MG/2ML IV SOLN
INTRAVENOUS | Status: DC | PRN
  Administered 2019-01-02: 150 mg via INTRAVENOUS

## 2019-01-03 MED ORDER — MORPHINE SULFATE (PF) 2 MG/ML IV SOLN: 1.0000 mg | INTRAVENOUS | Status: DC | PRN

## 2019-01-03 MED ORDER — DESVENLAFAXINE SUCCINATE ER 50 MG PO TB24
50.0000 mg | ORAL_TABLET | Freq: Every day | ORAL | Status: DC
  Administered 2019-01-03 – 2019-01-04 (×2): 50 mg via ORAL
  Filled 2019-01-03 (×2): qty 1

## 2019-01-03 MED ORDER — LEVOTHYROXINE SODIUM 25 MCG PO TABS
125.0000 ug | ORAL_TABLET | Freq: Every day | ORAL | Status: DC
  Administered 2019-01-03 – 2019-01-04 (×2): 125 ug via ORAL
  Filled 2019-01-03 (×2): qty 1

## 2019-01-03 MED ORDER — OLANZAPINE 5 MG PO TABS: 15.0000 mg | ORAL_TABLET | Freq: Every day | ORAL | Status: DC

## 2019-01-03 MED ORDER — RAMELTEON 8 MG PO TABS
8.0000 mg | ORAL_TABLET | Freq: Every day | ORAL | Status: DC
  Administered 2019-01-03 (×2): 8 mg via ORAL
  Filled 2019-01-03 (×2): qty 1

## 2019-01-03 MED ORDER — TRAZODONE HCL 50 MG PO TABS
150.0000 mg | ORAL_TABLET | Freq: Every day | ORAL | Status: DC
  Administered 2019-01-03 (×2): 150 mg via ORAL
  Filled 2019-01-03 (×2): qty 1

## 2019-01-03 MED ORDER — GABAPENTIN 300 MG PO CAPS: 600.0000 mg | ORAL_CAPSULE | Freq: Every day | ORAL | Status: DC

## 2019-01-03 MED ORDER — HYDROCODONE-ACETAMINOPHEN 5-325 MG PO TABS
1.0000 | ORAL_TABLET | ORAL | Status: DC | PRN
  Administered 2019-01-03: 2 via ORAL
  Administered 2019-01-03: 1 via ORAL
  Administered 2019-01-03: 2 via ORAL
  Filled 2019-01-03: qty 2
  Filled 2019-01-03: qty 1
  Filled 2019-01-03: qty 2

## 2019-01-03 MED ORDER — PIPERACILLIN-TAZOBACTAM 3.375 G IVPB
3.3750 g | Freq: Three times a day (TID) | INTRAVENOUS | Status: DC
  Administered 2019-01-03 – 2019-01-04 (×4): 3.375 g via INTRAVENOUS
  Filled 2019-01-03 (×3): qty 50

## 2019-01-03 MED ORDER — KCL IN DEXTROSE-NACL 20-5-0.45 MEQ/L-%-% IV SOLN
INTRAVENOUS | Status: DC
  Administered 2019-01-03 – 2019-01-04 (×3): via INTRAVENOUS
  Filled 2019-01-03 (×4): qty 1000

## 2019-01-03 MED ORDER — IBUPROFEN 800 MG PO TABS
800.0000 mg | ORAL_TABLET | Freq: Two times a day (BID) | ORAL | Status: DC | PRN
  Administered 2019-01-04: 800 mg via ORAL
  Filled 2019-01-03: qty 1

## 2019-01-03 MED ORDER — GABAPENTIN 300 MG PO CAPS
600.0000 mg | ORAL_CAPSULE | Freq: Every day | ORAL | Status: DC
  Administered 2019-01-03 (×2): 600 mg via ORAL
  Filled 2019-01-03 (×2): qty 2

## 2019-01-03 MED ORDER — TRAZODONE HCL 50 MG PO TABS: 150.0000 mg | ORAL_TABLET | Freq: Every day | ORAL | Status: DC

## 2019-01-03 MED ORDER — OLANZAPINE 5 MG PO TABS
15.0000 mg | ORAL_TABLET | Freq: Every day | ORAL | Status: DC
  Administered 2019-01-03 (×2): 15 mg via ORAL
  Filled 2019-01-03 (×2): qty 3

## 2019-01-03 MED ORDER — SIMETHICONE 80 MG PO CHEW
80.0000 mg | CHEWABLE_TABLET | Freq: Four times a day (QID) | ORAL | Status: DC | PRN
  Administered 2019-01-03 (×2): 80 mg via ORAL
  Filled 2019-01-03 (×2): qty 1

## 2019-01-03 MED ORDER — ONDANSETRON HCL 4 MG/2ML IJ SOLN: 4.0000 mg | Freq: Four times a day (QID) | INTRAMUSCULAR | Status: DC | PRN

## 2019-01-03 MED ORDER — POLYETHYLENE GLYCOL 3350 17 G PO PACK
17.0000 g | PACK | Freq: Every day | ORAL | Status: DC
  Administered 2019-01-03 – 2019-01-04 (×2): 17 g via ORAL
  Filled 2019-01-03 (×2): qty 1

## 2019-01-03 MED ORDER — MIRTAZAPINE 15 MG PO TABS
7.5000 mg | ORAL_TABLET | Freq: Every day | ORAL | Status: DC
  Administered 2019-01-03 (×2): 7.5 mg via ORAL
  Filled 2019-01-03 (×2): qty 1

## 2019-01-03 NOTE — Anesthesia Postprocedure Evaluation (Signed)
Anesthesia Post Note  Patient: Carla [name]  Procedure(s) Performed: APPENDECTOMY LAPAROSCOPIC (N/A Abdomen)     Patient location during evaluation: PACU Anesthesia Type: General Level of consciousness: sedated and patient cooperative Pain management: pain level controlled Vital Signs Assessment: post-procedure vital signs reviewed and stable Respiratory status: spontaneous breathing Cardiovascular status: stable Anesthetic complications: no    Last Vitals:  Vitals:   01/03/19 1317 01/03/19 2126  BP: 115/74 102/67  Pulse: 87 72  Resp: 16 14  Temp:  36.7 C  SpO2: 98% 98%    Last Pain:  Vitals:   01/03/19 2126  TempSrc: Oral  PainSc:                  Lewie Loron

## 2019-01-03 NOTE — Op Note (Signed)
Re:   Carla [name] DOB:   08/03/1961 MRN:   [id number]                   FACILITY:  [facility]  DATE OF PROCEDURE: 01/03/2019                              OPERATIVE REPORT  PREOPERATIVE DIAGNOSIS:  Appendicitis  POSTOPERATIVE DIAGNOSIS:  Acute appendicitis, focally perforated.  PROCEDURE:  Laparoscopic appendectomy.  SURGEON:  Sandria Bales. Ezzard Standing, MD  ASSISTANT:  No first assistant.  ANESTHESIA:  General endotracheal.  Anesthesiologist: Lewie Loron, MD CRNA: Illene Silver, CRNA  ASA:  2E  ESTIMATED BLOOD LOSS:  Minimal.  DRAINS: none   SPECIMEN:   Appendix  COUNTS CORRECT:  YES  INDICATIONS FOR PROCEDURE: Carla [name] is a 58 y.o. (DOB: 1961-06-22) white female whose primary care doctor is Georgianne Fick, MD and comes to the OR for an appendectomy.   I discussed with the patient, the indications and potential complications of appendiceal surgery.  The potential complications include, but are not limited to, bleeding, open surgery, bowel resection, and the possibility of another diagnosis.  OPERATIVE NOTE:  The patient underwent a general endotracheal anesthetic as supervised by Anesthesiologist: Lewie Loron, MD CRNA: Illene Silver, CRNA, General, in [facility] OR room #1.  The patient was given Zosyn prior to the beginning of the procedure and the abdomen was prepped with ChloraPrep.   A time-out was held and surgical checklist run.  An infraumbilical incision was made with sharp dissection carried down to the abdominal cavity.  An 12 mm Hasson trocar was inserted through the infraumbilical incision and into the peritoneal cavity.  A 30 degree 5 mm laparoscope was inserted through a 12 mm Hasson trocar and the Hasson trocar secured with a 0 Vicryl suture.  I placed a 5 mm trocar in the right upper quadrant and a 5 mm torcar in left lower quadrant and did abdominal exploration.    The right and left lobes of liver unremarkable.  Stomach was unremarkable.  The pelvic  organs were unremarkable.  I saw no other intra-abdominal abnormality.  The patient had appendicitis with the appendix located curled at the right pelvic brim.  It was walled off by the peritoneum anteriorly, the small bowel inferiorly and left laterally and the cecum right laterally and posterior.  The appendix was focally ruptured near the base of the appendix.  The mesentery of the appendix was divided with a Harmonic scalpel.  I got to the base of the appendix.  I then used a green load 45 mm Ethicon Endo-GIA stapler and fired this across the base of the appendix.  I placed the appendix in Eco Sac bag and delivered the bag through the umbilical incision.  I irrigated the abdomen with 1,00 cc of saline.  There was some bleeding at the stapled line at the base of the appendix.  I placed a single 2-0 vicryl across the staple line.  After irrigating the abdomen, I then removed the trocars, in turn.  The umbilical port fascia was closed with 0 Vicryl suture.   I closed the skin each site with a 4-0 Monocryl suture and painted the wounds with DermaBond.  I then injected a total of 30 mL of 0.25% Marcaine at the incisions.  Sponge and needle count were correct at the end of the case.  The patient was transferred to  the recovery room in good condition.  The patient tolerated the procedure well and it depends on the patient's post op clinical course as to when the patient could be discharged.   Ovidio Kin, MD, [facility] Pager: [phone] Office phone:  [phone]
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
## 2019-01-03 NOTE — Transfer of Care (Signed)
Immediate Anesthesia Transfer of Care Note  Patient: Carla [name]  Procedure(s) Performed: APPENDECTOMY LAPAROSCOPIC (N/A Abdomen)  Patient Location: PACU  Anesthesia Type:General  Level of Consciousness: awake, alert , oriented and patient cooperative  Airway & Oxygen Therapy: Patient Spontanous Breathing and Patient connected to face mask oxygen  Post-op Assessment: Report given to RN, Post -op Vital signs reviewed and stable and Patient moving all extremities X 4  Post vital signs: stable  Last Vitals:  Vitals Value Taken Time  BP 116/76 01/03/2019 12:15 AM  Temp    Pulse 81 01/03/2019 12:17 AM  Resp 20 01/03/2019 12:17 AM  SpO2 100 % 01/03/2019 12:17 AM  Vitals shown include unvalidated device data.  Last Pain:  Vitals:   01/02/19 2130  TempSrc:   PainSc: 3          Complications: No apparent anesthesia complications

## 2019-01-04 MED ORDER — HYDROCODONE-ACETAMINOPHEN 5-325 MG PO TABS: ORAL_TABLET | ORAL | 0 refills | Status: DC

## 2019-01-04 MED ORDER — ACETAMINOPHEN 325 MG PO TABS: ORAL_TABLET | ORAL | Status: AC

## 2019-01-04 MED ORDER — SACCHAROMYCES BOULARDII 250 MG PO CAPS: ORAL_CAPSULE | ORAL | Status: DC

## 2019-01-04 MED ORDER — HYDROCODONE-ACETAMINOPHEN 5-325 MG PO TABS: 1.0000 | ORAL_TABLET | Freq: Four times a day (QID) | ORAL | 0 refills | Status: DC | PRN

## 2019-01-04 MED ORDER — AMOXICILLIN-POT CLAVULANATE 875-125 MG PO TABS
1.0000 | ORAL_TABLET | Freq: Two times a day (BID) | ORAL | Status: DC
  Administered 2019-01-04: 1 via ORAL
  Filled 2019-01-04: qty 1

## 2019-01-04 MED ORDER — IBUPROFEN 200 MG PO TABS: ORAL_TABLET | ORAL | Status: DC

## 2019-01-04 MED ORDER — SACCHAROMYCES BOULARDII 250 MG PO CAPS
250.0000 mg | ORAL_CAPSULE | Freq: Two times a day (BID) | ORAL | Status: DC
  Administered 2019-01-04: 250 mg via ORAL
  Filled 2019-01-04: qty 1

## 2019-01-04 MED ORDER — IBUPROFEN 200 MG PO TABS: 600.0000 mg | ORAL_TABLET | Freq: Two times a day (BID) | ORAL | Status: DC | PRN

## 2019-01-04 MED ORDER — ACETAMINOPHEN 500 MG PO TABS
1000.0000 mg | ORAL_TABLET | Freq: Three times a day (TID) | ORAL | Status: DC
  Administered 2019-01-04: 1000 mg via ORAL
  Filled 2019-01-04: qty 2

## 2019-01-04 MED ORDER — AMOXICILLIN-POT CLAVULANATE 875-125 MG PO TABS: 1.0000 | ORAL_TABLET | Freq: Two times a day (BID) | ORAL | 0 refills | Status: AC
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
## 2019-01-04 NOTE — Discharge Instructions (Signed)
Instructions for how to take your narcotic prescription: Take 1 tablet every 6 hours as needed for pain not relieved by plain Tylenol and ibuprofen.  This prescription has Tylenol/acetaminophen in it.  Do not exceed 4000 mg of Tylenol/acetaminophen per day.   CCS ______[facility] LAPAROSCOPIC SURGERY: POST OP INSTRUCTIONS Always review your discharge instruction sheet given to you by the facility where your surgery was performed. IF YOU HAVE DISABILITY OR FAMILY LEAVE FORMS, YOU MUST BRING THEM TO THE OFFICE FOR PROCESSING.   DO NOT GIVE THEM TO YOUR DOCTOR.  1. A prescription for pain medication may be given to you upon discharge.  Take your pain medication as prescribed, if needed.  If narcotic pain medicine is not needed, then you may take acetaminophen (Tylenol) or ibuprofen (Advil) as needed. 2. Take your usually prescribed medications unless otherwise directed. 3. If you need a refill on your pain medication, please contact your pharmacy.  They will contact our office to request authorization. Prescriptions will not be filled after 5pm or on week-ends. 4. You should follow a light diet the first few days after arrival home, such as soup and crackers, etc.  Be sure to include lots of fluids daily. 5. Most patients will experience some swelling and bruising in the area of the incisions.  Ice packs will help.  Swelling and bruising can take several days to resolve.  6. It is common to experience some constipation if taking pain medication after surgery.  Increasing fluid intake and taking a stool softener (such as Colace) will usually help or prevent this problem from occurring.  A mild laxative (Milk of Magnesia or Miralax) should be taken according to package instructions if there are no bowel movements after 48 hours. 7. Unless discharge instructions indicate otherwise, you may remove your bandages 24-48 hours after surgery, and you may shower at that time.  You may have  steri-strips (small skin tapes) in place directly over the incision.  These strips should be left on the skin for 7-10 days.  If your surgeon used skin glue on the incision, you may shower in 24 hours.  The glue will flake off over the next 2-3 weeks.  Any sutures or staples will be removed at the office during your follow-up visit. 8. ACTIVITIES:  You may resume regular (light) daily activities beginning the next day--such as daily self-care, walking, climbing stairs--gradually increasing activities as tolerated.  You may have sexual intercourse when it is comfortable.  Refrain from any heavy lifting or straining until approved by your doctor. a. You may drive when you are no longer taking prescription pain medication, you can comfortably wear a seatbelt, and you can safely maneuver your car and apply brakes. b. RETURN TO WORK:  __________________________________________________________ 9. You should see your doctor in the office for a follow-up appointment approximately 2-3 weeks after your surgery.  Make sure that you call for this appointment within a day or two after you arrive home to insure a convenient appointment time. 10. OTHER INSTRUCTIONS: __________________________________________________________________________________________________________________________ __________________________________________________________________________________________________________________________ WHEN TO CALL YOUR DOCTOR: 1. Fever over 101.0 2. Inability to urinate 3. Continued bleeding from incision. 4. Increased pain, redness, or drainage from the incision. 5. Increasing abdominal pain  The clinic staff is available to answer your questions during regular business hours.  Please dont hesitate to call and ask to speak to one of the nurses for clinical concerns.  If you have a medical emergency, go to the nearest emergency room or call  911.  A surgeon from [facility] is always on call at the  hospital. [address], [place], [place]  [address] ? [address] [address], [place], [place]   [address] [phone] ? [phone] ? FAX [phone] Web site: [email or web address]    Laparoscopic Appendectomy, Adult, Care After This sheet gives you information about how to care for yourself after your procedure. Your doctor may also give you more specific instructions. If you have problems or questions, contact your doctor. What can I expect after the procedure? After the procedure, it is common to have:  Little energy for normal activities.  Mild pain in the area where the cuts from surgery (incisions) were made.  Trouble pooping (constipation). This can be caused by: ? Pain medicine. ? A lack of activity. Follow these instructions at home: Medicines  Take over-the-counter and prescription medicines only as told by your doctor.  If you were prescribed an antibiotic medicine, take it as told by your doctor. Do not stop taking it even if you start to feel better.  Do not drive or use heavy machinery while taking prescription pain medicine.  Ask your doctor if the medicine you are taking can cause trouble pooping. You may need to take steps to prevent or treat trouble pooping: ? Drink enough fluid to keep your pee (urine) pale yellow. ? Take over-the-counter or prescription medicines. ? Eat foods that are high in fiber. These include beans, whole grains, and fresh fruits and vegetables. ? Limit foods that are high in fat and sugar. These include fried or sweet foods. Incision care   Follow instructions from your doctor about how to take care of your cuts from surgery. Make sure you: ? Wash your hands with soap and water before and after you change your bandage (dressing). If you cannot use soap and water, use hand sanitizer. ? Change your bandage as told by your doctor. ? Leave stitches (sutures), skin glue, or skin tape (adhesive) strips in place. They  may need to stay in place for 2 weeks or longer. If tape strips get loose and curl up, you may trim the loose edges. Do not remove tape strips completely unless your doctor says it is okay.  Check your cuts from surgery every day for signs of infection. Check for: ? Redness, swelling, or pain. ? Fluid or blood. ? Warmth. ? Pus or a bad smell. Bathing  Keep your cuts from surgery clean and dry. Clean them as told by your doctor. To do this: 1. Gently wash the cuts with soap and water. 2. Rinse the cuts with water to remove all soap. 3. Pat the cuts dry with a clean towel. Do not rub the cuts.  Do not take baths, swim, or use a hot tub for 2 weeks, or until your doctor says it is okay. You may take showers after 48 hours. Activity   Do not drive for 24 hours if you were given a medicine to help you relax (sedative) during your procedure.  Rest after the procedure. Return to your normal activities as told by your doctor. Ask your doctor what activities are safe for you.  For 3 weeks, or for as long as told by your doctor: ? Do not lift anything that is heavier than 10 lb (4.5 kg), or the limit that you are told. ? Do not play contact sports. General instructions  If you were sent home with a drain, follow instructions from your doctor on how  to care for it.  Take deep breaths. This helps to keep your lungs from getting an infection (pneumonia).  Keep all follow-up visits as told by your doctor. This is important. Contact a doctor if:  You have redness, swelling, or pain around a cut from surgery.  You have fluid or blood coming from a cut.  Your cut feels warm to the touch.  You have pus or a bad smell coming from a cut or a bandage.  The edges of a cut break open after the stitches have been taken out.  You have pain in your shoulders that gets worse.  You feel dizzy or you pass out (faint).  You have shortness of breath.  You keep feeling sick to your stomach  (nauseous).  You keep throwing up (vomiting).  You get watery poop (diarrhea) or you cannot control your poop.  You lose your appetite.  You have swelling or pain in your legs.  You get a rash. Get help right away if:  You have a fever.  You have trouble breathing.  You have sharp pains in your chest. Summary  After the procedure, it is common to have low energy, mild pain, and trouble pooping.  Infection is a common problem after this procedure. Follow your doctor's instructions about caring for yourself after the procedure.  Rest after the procedure. Return to your normal activities as told by your doctor.  Contact your doctor if you see signs of infection around your cuts from surgery, or you get short of breath. Get help right away if you have a fever, chest pain, or trouble breathing. This information is not intended to replace advice given to you by your health care provider. Make sure you discuss any questions you have with your health care provider. Document Released: 08/01/2009 Document Revised: 04/07/2018 Document Reviewed: 04/07/2018 Elsevier Interactive Patient Education  2019 [employer].

## 2019-01-04 NOTE — Progress Notes (Signed)
Pt alert and oriented, tolerating diet.  D/C instructions given, all questions answered. Pt d/cd to home. 

## 2019-01-06 ENCOUNTER — Encounter: Payer: Self-pay | Admitting: Physical Therapy

## 2019-01-10 DIAGNOSIS — F331 Major depressive disorder, recurrent, moderate: Secondary | ICD-10-CM | POA: Diagnosis not present

## 2019-01-10 DIAGNOSIS — F411 Generalized anxiety disorder: Secondary | ICD-10-CM | POA: Diagnosis not present

## 2019-01-12 ENCOUNTER — Encounter: Payer: Self-pay | Admitting: Physical Therapy

## 2019-01-17 ENCOUNTER — Encounter: Payer: Self-pay | Admitting: Physical Therapy

## 2019-01-18 DIAGNOSIS — F411 Generalized anxiety disorder: Secondary | ICD-10-CM | POA: Diagnosis not present

## 2019-01-18 DIAGNOSIS — F331 Major depressive disorder, recurrent, moderate: Secondary | ICD-10-CM | POA: Diagnosis not present

## 2019-01-18 DIAGNOSIS — F4310 Post-traumatic stress disorder, unspecified: Secondary | ICD-10-CM | POA: Diagnosis not present

## 2019-01-19 ENCOUNTER — Encounter: Payer: Self-pay | Admitting: Physical Therapy

## 2019-01-24 DIAGNOSIS — F411 Generalized anxiety disorder: Secondary | ICD-10-CM | POA: Diagnosis not present

## 2019-01-24 DIAGNOSIS — F331 Major depressive disorder, recurrent, moderate: Secondary | ICD-10-CM | POA: Diagnosis not present

## 2019-01-31 DIAGNOSIS — Z01818 Encounter for other preprocedural examination: Secondary | ICD-10-CM | POA: Diagnosis not present

## 2019-01-31 DIAGNOSIS — Z1159 Encounter for screening for other viral diseases: Secondary | ICD-10-CM | POA: Diagnosis not present

## 2019-02-03 DIAGNOSIS — F322 Major depressive disorder, single episode, severe without psychotic features: Secondary | ICD-10-CM | POA: Diagnosis not present

## 2019-02-10 DIAGNOSIS — F411 Generalized anxiety disorder: Secondary | ICD-10-CM | POA: Diagnosis not present

## 2019-02-10 DIAGNOSIS — F331 Major depressive disorder, recurrent, moderate: Secondary | ICD-10-CM | POA: Diagnosis not present

## 2019-02-28 DIAGNOSIS — Z01818 Encounter for other preprocedural examination: Secondary | ICD-10-CM | POA: Diagnosis not present

## 2019-02-28 DIAGNOSIS — F322 Major depressive disorder, single episode, severe without psychotic features: Secondary | ICD-10-CM | POA: Diagnosis not present

## 2019-02-28 DIAGNOSIS — Z1159 Encounter for screening for other viral diseases: Secondary | ICD-10-CM | POA: Diagnosis not present

## 2019-03-03 DIAGNOSIS — F4310 Post-traumatic stress disorder, unspecified: Secondary | ICD-10-CM | POA: Diagnosis not present

## 2019-03-03 DIAGNOSIS — F411 Generalized anxiety disorder: Secondary | ICD-10-CM | POA: Diagnosis not present

## 2019-03-03 DIAGNOSIS — Z79890 Hormone replacement therapy (postmenopausal): Secondary | ICD-10-CM | POA: Diagnosis not present

## 2019-03-03 DIAGNOSIS — Z791 Long term (current) use of non-steroidal anti-inflammatories (NSAID): Secondary | ICD-10-CM | POA: Diagnosis not present

## 2019-03-03 DIAGNOSIS — F332 Major depressive disorder, recurrent severe without psychotic features: Secondary | ICD-10-CM | POA: Diagnosis not present

## 2019-03-03 DIAGNOSIS — F322 Major depressive disorder, single episode, severe without psychotic features: Secondary | ICD-10-CM | POA: Diagnosis not present

## 2019-03-03 DIAGNOSIS — F333 Major depressive disorder, recurrent, severe with psychotic symptoms: Secondary | ICD-10-CM | POA: Diagnosis not present

## 2019-03-03 DIAGNOSIS — E039 Hypothyroidism, unspecified: Secondary | ICD-10-CM | POA: Diagnosis not present

## 2019-03-03 DIAGNOSIS — Z79899 Other long term (current) drug therapy: Secondary | ICD-10-CM | POA: Diagnosis not present

## 2019-03-03 DIAGNOSIS — Z1159 Encounter for screening for other viral diseases: Secondary | ICD-10-CM | POA: Diagnosis not present

## 2019-03-24 DIAGNOSIS — F411 Generalized anxiety disorder: Secondary | ICD-10-CM | POA: Diagnosis not present

## 2019-03-24 DIAGNOSIS — F331 Major depressive disorder, recurrent, moderate: Secondary | ICD-10-CM | POA: Diagnosis not present

## 2019-03-28 DIAGNOSIS — Z01818 Encounter for other preprocedural examination: Secondary | ICD-10-CM | POA: Diagnosis not present

## 2019-03-28 DIAGNOSIS — F322 Major depressive disorder, single episode, severe without psychotic features: Secondary | ICD-10-CM | POA: Diagnosis not present

## 2019-03-28 DIAGNOSIS — Z1159 Encounter for screening for other viral diseases: Secondary | ICD-10-CM | POA: Diagnosis not present

## 2019-03-31 DIAGNOSIS — M1990 Unspecified osteoarthritis, unspecified site: Secondary | ICD-10-CM | POA: Diagnosis not present

## 2019-03-31 DIAGNOSIS — F4310 Post-traumatic stress disorder, unspecified: Secondary | ICD-10-CM | POA: Diagnosis not present

## 2019-03-31 DIAGNOSIS — F322 Major depressive disorder, single episode, severe without psychotic features: Secondary | ICD-10-CM | POA: Diagnosis not present

## 2019-03-31 DIAGNOSIS — Z79899 Other long term (current) drug therapy: Secondary | ICD-10-CM | POA: Diagnosis not present

## 2019-03-31 DIAGNOSIS — E039 Hypothyroidism, unspecified: Secondary | ICD-10-CM | POA: Diagnosis not present

## 2019-03-31 DIAGNOSIS — F419 Anxiety disorder, unspecified: Secondary | ICD-10-CM | POA: Diagnosis not present

## 2019-04-07 DIAGNOSIS — F411 Generalized anxiety disorder: Secondary | ICD-10-CM | POA: Diagnosis not present

## 2019-04-07 DIAGNOSIS — F331 Major depressive disorder, recurrent, moderate: Secondary | ICD-10-CM | POA: Diagnosis not present

## 2019-04-18 DIAGNOSIS — F411 Generalized anxiety disorder: Secondary | ICD-10-CM | POA: Diagnosis not present

## 2019-04-18 DIAGNOSIS — F4310 Post-traumatic stress disorder, unspecified: Secondary | ICD-10-CM | POA: Diagnosis not present

## 2019-04-20 DIAGNOSIS — F331 Major depressive disorder, recurrent, moderate: Secondary | ICD-10-CM | POA: Diagnosis not present

## 2019-04-20 DIAGNOSIS — F411 Generalized anxiety disorder: Secondary | ICD-10-CM | POA: Diagnosis not present

## 2019-04-26 DIAGNOSIS — Z1159 Encounter for screening for other viral diseases: Secondary | ICD-10-CM | POA: Diagnosis not present

## 2019-04-26 DIAGNOSIS — F322 Major depressive disorder, single episode, severe without psychotic features: Secondary | ICD-10-CM | POA: Diagnosis not present

## 2019-04-26 DIAGNOSIS — Z01818 Encounter for other preprocedural examination: Secondary | ICD-10-CM | POA: Diagnosis not present

## 2019-04-28 DIAGNOSIS — Z01818 Encounter for other preprocedural examination: Secondary | ICD-10-CM | POA: Diagnosis not present

## 2019-04-28 DIAGNOSIS — F322 Major depressive disorder, single episode, severe without psychotic features: Secondary | ICD-10-CM | POA: Diagnosis not present

## 2019-04-28 DIAGNOSIS — F4310 Post-traumatic stress disorder, unspecified: Secondary | ICD-10-CM | POA: Diagnosis not present

## 2019-04-28 DIAGNOSIS — F419 Anxiety disorder, unspecified: Secondary | ICD-10-CM | POA: Diagnosis not present

## 2019-04-28 DIAGNOSIS — F333 Major depressive disorder, recurrent, severe with psychotic symptoms: Secondary | ICD-10-CM | POA: Diagnosis not present

## 2019-04-28 DIAGNOSIS — M1990 Unspecified osteoarthritis, unspecified site: Secondary | ICD-10-CM | POA: Diagnosis not present

## 2019-04-28 DIAGNOSIS — Z79899 Other long term (current) drug therapy: Secondary | ICD-10-CM | POA: Diagnosis not present

## 2019-04-28 DIAGNOSIS — Z79890 Hormone replacement therapy (postmenopausal): Secondary | ICD-10-CM | POA: Diagnosis not present

## 2019-04-28 DIAGNOSIS — E039 Hypothyroidism, unspecified: Secondary | ICD-10-CM | POA: Diagnosis not present

## 2019-05-11 ENCOUNTER — Ambulatory Visit: Payer: BC Managed Care – PPO | Admitting: Neurology

## 2019-05-11 ENCOUNTER — Encounter: Payer: Self-pay | Admitting: Neurology

## 2019-05-11 ENCOUNTER — Other Ambulatory Visit: Payer: Self-pay

## 2019-05-11 DIAGNOSIS — R269 Unspecified abnormalities of gait and mobility: Secondary | ICD-10-CM

## 2019-05-11 DIAGNOSIS — G3281 Cerebellar ataxia in diseases classified elsewhere: Secondary | ICD-10-CM

## 2019-05-11 HISTORY — DX: Unspecified abnormalities of gait and mobility: R26.9

## 2019-05-11 NOTE — Progress Notes (Signed)
Reason for visit: Gait disorder, dizziness  Carla [name] is an 58 y.o. female  History of present illness:  Carla [name] is a 58 year old right-handed white female with a history of severe organic depression and a history of a chronic sensation of imbalance or dizziness when she is up on her feet.  The patient has reported a spongy feeling in her feet when she walks, she feels unsteady.  As soon as she sits down the sensation goes away, if she reaches out and touches a table or chair this sensation improves.  She has undergone EMG and nerve conduction study evaluation that did not show evidence of a peripheral neuropathy.  Blood work so far has been unremarkable.  MRI of the brain was normal.  She has been seen and evaluated through ENT.  No abnormalities were noted in this regard.  The patient does not have true vertigo, she does not have increased dizziness with stooping or bending.  She is able to operate a motor vehicle without difficulty as she is sitting with this.  She has not altered any activities of daily living because of the dizziness with exception that she is no longer feeling safe riding a bicycle.  The patient underwent physical therapy without benefit.  She returns here for an evaluation.  Past Medical History:  Diagnosis Date  . Allergy   . Anxiety   . Arthritis   . Depression   . Endometriosis   . Episode of heavy vaginal bleeding 03/31/2015  . Hypothyroidism   . Osteoporosis   . Thyroid disease    hypothyroidism  . Thyroid disease     Past Surgical History:  Procedure Laterality Date  . BACK SURGERY    . COLONOSCOPY  04/28/11   Normal  . cyst left 2nd finger    . DILATION AND CURETTAGE OF UTERUS    . DILITATION & CURRETTAGE/HYSTROSCOPY WITH NOVASURE ABLATION N/A 03/14/2015   Procedure: DILATATION & CURETTAGE/HYSTEROSCOPY WITH POSSIBLE NOVASURE ABLATION;  Surgeon: Olivia Mackieichard Taavon, MD;  Location: [facility];  Service: Gynecology;  Laterality: N/A;  . LAPAROSCOPIC  APPENDECTOMY N/A 01/02/2019   Procedure: APPENDECTOMY LAPAROSCOPIC;  Surgeon: Ovidio KinNewman, David, MD;  Location: [facility];  Service: General;  Laterality: N/A;  . LAPAROSCOPY ABDOMEN DIAGNOSTIC    . left shoulder arthroscopy    . LUMBAR DISC SURGERY    . SHOULDER SURGERY      Family History  Problem Relation Age of Onset  . Alcohol abuse Father   . Bipolar disorder Father     Social history:  reports that she has never smoked. She has never used smokeless tobacco. She reports that she does not drink alcohol or use drugs.    Allergies  Allergen Reactions  . Clarithromycin Nausea And Vomiting  . Codeine Other (See Comments)    headache  . Codeine Other (See Comments)    h/a  . Erythromycin Nausea And Vomiting  . Erythromycin Nausea And Vomiting    Medications:  Prior to Admission medications   Medication Sig Start Date End Date Taking? Authorizing Provider  acetaminophen (TYLENOL) 325 MG tablet You can take 2 tablets every 4 hours as needed for pain.  You can alternate this with ibuprofen.  You can buy this over-the-counter at any drugstore. DO NOT TAKE MORE THAN 4000 MG OF TYLENOL PER DAY.  IT CAN HARM YOUR LIVER.  TYLENOL (ACETAMINOPHEN) IS ALSO IN YOUR PRESCRIPTION PAIN MEDICATION.  YOU HAVE TO COUNT IT IN YOUR DAILY TOTAL. 01/04/19  Yes Sherrie GeorgeJennings, Willard, PA-C  cyclobenzaprine (FLEXERIL) 10 MG tablet Take 10 mg by mouth at bedtime. 02/11/18  Yes [provider]  desvenlafaxine (PRISTIQ) 50 MG 24 hr tablet Take 50 mg by mouth daily. 05/24/18  Yes [provider]  docusate sodium (COLACE) 100 MG capsule Take 100 mg by mouth 2 (two) times daily.    Yes [provider]  gabapentin (NEURONTIN) 600 MG tablet  04/17/19  Yes [provider]  ibuprofen (ADVIL,MOTRIN) 200 MG tablet You can take 2 to 3 tablets every 6 hours as needed.  You can alternate this with plain Tylenol.  Do not exceed this limit it can harm your kidneys.  You can buy this over-the-counter at  any drugstore. 01/04/19  Yes Sherrie GeorgeJennings, Willard, PA-C  Inositol 500 MG TABS Take 500 mg by mouth daily.   Yes [provider]  L-Methylfolate 15 MG TABS Take 15 mg by mouth daily.    Yes [provider]  levothyroxine (SYNTHROID) 125 MCG tablet Take 1 tablet (125 mcg total) by mouth daily before breakfast. 07/12/18  Yes Oneta RackLewis, Tanika N, NP  Melatonin 5 MG CAPS Take 1 capsule by mouth at bedtime.   Yes [provider]  mirtazapine (REMERON) 15 MG tablet Take 7.5 mg by mouth at bedtime.    Yes [provider]  OLANZapine (ZYPREXA) 15 MG tablet Take 15 mg by mouth at bedtime.   Yes [provider]  omeprazole (PRILOSEC OTC) 20 MG tablet Take 20 mg by mouth daily.   Yes [provider]  polyethylene glycol (MIRALAX / GLYCOLAX) packet Take 17 g by mouth daily.   Yes [provider]  ramelteon (ROZEREM) 8 MG tablet Take 1 tablet (8 mg total) by mouth at bedtime. 07/11/18  Yes Oneta RackLewis, Tanika N, NP  simethicone (MYLICON) 80 MG chewable tablet Chew 80 mg by mouth every 6 (six) hours as needed for flatulence.   Yes [provider]  traZODone (DESYREL) 100 MG tablet Take 150 mg by mouth at bedtime. 03/31/18  Yes [provider]  valACYclovir (VALTREX) 1000 MG tablet Take 1 g by mouth daily. 06/16/18  Yes [provider]    ROS:  Out of a complete 14 system review of symptoms, the patient complains only of the following symptoms, and all other reviewed systems are negative.  Dizziness Depression Walking problems  Blood pressure 125/84, pulse 95, temperature 97.7 F (36.5 C), weight 165 lb (74.8 kg), last menstrual period 04/22/2011.   Blood pressure, right arm, sitting is 114/80.  Blood pressure, right arm, standing is 110 systolic.  Physical Exam  General: The patient is alert and cooperative at the time of the examination.  The patient has a flat affect.  Skin: No significant peripheral edema is noted.    Neurologic Exam  Mental status: The patient is alert and oriented x 3 at the time of the examination. The patient has apparent normal recent and remote memory, with an apparently normal attention span and concentration ability.   Cranial nerves: Facial symmetry is present. Speech is normal, no aphasia or dysarthria is noted. Extraocular movements are full. Visual fields are full.  Motor: The patient has good strength in all 4 extremities.  Sensory examination: Soft touch sensation is symmetric on the face, arms, and legs.  Coordination: The patient has good finger-nose-finger and heel-to-shin bilaterally.  Gait and station: The patient has a normal gait. Tandem gait is very minimally unsteady. Romberg is negative. No drift is seen.  Reflexes: Deep tendon reflexes are symmetric.   Assessment/Plan:  1.  Postural dizziness  2.  Gait instability, mild  3.  Severe organic depression  The patient reports a history that is most consistent with an abnormality with sensory input to the brain.  The most common etiology of this is a peripheral neuropathy but we need to investigate for a spinal cord abnormality as well.  We will check MRI of the cervical spine to exclude a posterior column abnormality such as multiple sclerosis.  The patient will undergo further blood work.  I discussed the possibility of a second opinion regarding her dizziness, is not clear that the patient wishes to pursue this.  She will follow-up otherwise in 6 months.  Jill Alexanders MD 05/11/2019 8:54 AM  [facility] [address] [place], [place] [address]  Phone [phone] Fax [phone]

## 2019-05-12 DIAGNOSIS — Z6828 Body mass index (BMI) 28.0-28.9, adult: Secondary | ICD-10-CM | POA: Diagnosis not present

## 2019-05-12 DIAGNOSIS — Z1231 Encounter for screening mammogram for malignant neoplasm of breast: Secondary | ICD-10-CM | POA: Diagnosis not present

## 2019-05-12 DIAGNOSIS — Z01419 Encounter for gynecological examination (general) (routine) without abnormal findings: Secondary | ICD-10-CM | POA: Diagnosis not present

## 2019-05-12 DIAGNOSIS — Z1151 Encounter for screening for human papillomavirus (HPV): Secondary | ICD-10-CM | POA: Diagnosis not present

## 2019-05-16 ENCOUNTER — Telehealth: Payer: Self-pay | Admitting: Neurology

## 2019-05-16 NOTE — Telephone Encounter (Signed)
no to the covid-19 questions MR Cervical spine w/wo contrast Dr. Stephanie Acre Auth: [id number] (exp. 05/15/19 to 11/10/19). Patient is scheduled at [facility] for 05/17/19.

## 2019-05-17 ENCOUNTER — Other Ambulatory Visit: Payer: Self-pay

## 2019-05-17 ENCOUNTER — Ambulatory Visit (INDEPENDENT_AMBULATORY_CARE_PROVIDER_SITE_OTHER): Payer: BC Managed Care – PPO

## 2019-05-17 DIAGNOSIS — R269 Unspecified abnormalities of gait and mobility: Secondary | ICD-10-CM

## 2019-05-17 DIAGNOSIS — G3281 Cerebellar ataxia in diseases classified elsewhere: Secondary | ICD-10-CM

## 2019-05-17 MED ORDER — GADOBENATE DIMEGLUMINE 529 MG/ML IV SOLN
14.0000 mL | Freq: Once | INTRAVENOUS | Status: AC | PRN
  Administered 2019-05-17: 14 mL via INTRAVENOUS

## 2019-05-18 ENCOUNTER — Telehealth: Payer: Self-pay | Admitting: Neurology

## 2019-05-18 NOTE — Telephone Encounter (Signed)
I called the patient.  The patient has no significant findings on the MRI that would result in some troubles with ambulation.  I suspect that she may be having side effects from 1 or several medications combined, she is on multiple psychoactive drugs including Pristiq, gabapentin, mirtazapine, Zyprexa, Rozerem.  Gabapentin in particular may result in some troubles with gait instability.    MRI cervical 05/18/19:  IMPRESSION:   MRI cervical spine (with and without) demonstrating: - At C5-6: disc bulging and uncovertebral joint hypertrophy with mild right and severe left foraminal stenosis. - At C6-7: disc bulging and uncovertebral joint hypertrophy with mild biforaminal stenosis.

## 2019-05-24 DIAGNOSIS — Z20828 Contact with and (suspected) exposure to other viral communicable diseases: Secondary | ICD-10-CM | POA: Diagnosis not present

## 2019-05-24 DIAGNOSIS — F322 Major depressive disorder, single episode, severe without psychotic features: Secondary | ICD-10-CM | POA: Diagnosis not present

## 2019-05-24 DIAGNOSIS — Z01812 Encounter for preprocedural laboratory examination: Secondary | ICD-10-CM | POA: Diagnosis not present

## 2019-05-25 DIAGNOSIS — E559 Vitamin D deficiency, unspecified: Secondary | ICD-10-CM | POA: Diagnosis not present

## 2019-05-25 DIAGNOSIS — F331 Major depressive disorder, recurrent, moderate: Secondary | ICD-10-CM | POA: Diagnosis not present

## 2019-05-25 DIAGNOSIS — R5383 Other fatigue: Secondary | ICD-10-CM | POA: Diagnosis not present

## 2019-05-25 DIAGNOSIS — F411 Generalized anxiety disorder: Secondary | ICD-10-CM | POA: Diagnosis not present

## 2019-05-25 DIAGNOSIS — M810 Age-related osteoporosis without current pathological fracture: Secondary | ICD-10-CM | POA: Diagnosis not present

## 2019-05-26 DIAGNOSIS — F322 Major depressive disorder, single episode, severe without psychotic features: Secondary | ICD-10-CM | POA: Diagnosis not present

## 2019-05-26 DIAGNOSIS — Z79899 Other long term (current) drug therapy: Secondary | ICD-10-CM | POA: Diagnosis not present

## 2019-06-06 DIAGNOSIS — Z0000 Encounter for general adult medical examination without abnormal findings: Secondary | ICD-10-CM | POA: Diagnosis not present

## 2019-06-06 DIAGNOSIS — E039 Hypothyroidism, unspecified: Secondary | ICD-10-CM | POA: Diagnosis not present

## 2019-06-06 DIAGNOSIS — M810 Age-related osteoporosis without current pathological fracture: Secondary | ICD-10-CM | POA: Diagnosis not present

## 2019-06-06 DIAGNOSIS — F411 Generalized anxiety disorder: Secondary | ICD-10-CM | POA: Diagnosis not present

## 2019-06-06 DIAGNOSIS — E559 Vitamin D deficiency, unspecified: Secondary | ICD-10-CM | POA: Diagnosis not present

## 2019-06-13 DIAGNOSIS — F411 Generalized anxiety disorder: Secondary | ICD-10-CM | POA: Diagnosis not present

## 2019-06-13 DIAGNOSIS — Z23 Encounter for immunization: Secondary | ICD-10-CM | POA: Diagnosis not present

## 2019-06-13 DIAGNOSIS — Z0000 Encounter for general adult medical examination without abnormal findings: Secondary | ICD-10-CM | POA: Diagnosis not present

## 2019-06-13 DIAGNOSIS — E039 Hypothyroidism, unspecified: Secondary | ICD-10-CM | POA: Diagnosis not present

## 2019-06-13 DIAGNOSIS — M810 Age-related osteoporosis without current pathological fracture: Secondary | ICD-10-CM | POA: Diagnosis not present

## 2019-06-20 ENCOUNTER — Other Ambulatory Visit: Payer: Self-pay | Admitting: Emergency Medicine

## 2019-06-20 DIAGNOSIS — Z20822 Contact with and (suspected) exposure to covid-19: Secondary | ICD-10-CM

## 2019-06-20 DIAGNOSIS — R6889 Other general symptoms and signs: Secondary | ICD-10-CM | POA: Diagnosis not present

## 2019-06-21 LAB — NOVEL CORONAVIRUS, NAA: SARS-CoV-2, NAA: NOT DETECTED

## 2019-06-30 DIAGNOSIS — F322 Major depressive disorder, single episode, severe without psychotic features: Secondary | ICD-10-CM | POA: Diagnosis not present

## 2019-06-30 DIAGNOSIS — Z20828 Contact with and (suspected) exposure to other viral communicable diseases: Secondary | ICD-10-CM | POA: Diagnosis not present

## 2019-07-04 ENCOUNTER — Other Ambulatory Visit: Payer: Self-pay

## 2019-07-04 DIAGNOSIS — Z20822 Contact with and (suspected) exposure to covid-19: Secondary | ICD-10-CM

## 2019-07-04 DIAGNOSIS — R6889 Other general symptoms and signs: Secondary | ICD-10-CM | POA: Diagnosis not present

## 2019-07-06 LAB — NOVEL CORONAVIRUS, NAA: SARS-CoV-2, NAA: NOT DETECTED

## 2019-07-17 ENCOUNTER — Other Ambulatory Visit: Payer: Self-pay

## 2019-07-17 DIAGNOSIS — Z20822 Contact with and (suspected) exposure to covid-19: Secondary | ICD-10-CM

## 2019-07-18 DIAGNOSIS — F4310 Post-traumatic stress disorder, unspecified: Secondary | ICD-10-CM | POA: Diagnosis not present

## 2019-07-18 DIAGNOSIS — F411 Generalized anxiety disorder: Secondary | ICD-10-CM | POA: Diagnosis not present

## 2019-07-18 DIAGNOSIS — F331 Major depressive disorder, recurrent, moderate: Secondary | ICD-10-CM | POA: Diagnosis not present

## 2019-07-18 LAB — NOVEL CORONAVIRUS, NAA: SARS-CoV-2, NAA: NOT DETECTED

## 2019-08-02 DIAGNOSIS — F322 Major depressive disorder, single episode, severe without psychotic features: Secondary | ICD-10-CM | POA: Diagnosis not present

## 2019-08-02 DIAGNOSIS — Z20828 Contact with and (suspected) exposure to other viral communicable diseases: Secondary | ICD-10-CM | POA: Diagnosis not present

## 2019-08-04 DIAGNOSIS — F322 Major depressive disorder, single episode, severe without psychotic features: Secondary | ICD-10-CM | POA: Diagnosis not present

## 2019-08-16 ENCOUNTER — Other Ambulatory Visit: Payer: Self-pay

## 2019-08-16 DIAGNOSIS — Z20822 Contact with and (suspected) exposure to covid-19: Secondary | ICD-10-CM

## 2019-08-18 ENCOUNTER — Other Ambulatory Visit: Payer: Self-pay

## 2019-08-18 DIAGNOSIS — Z20822 Contact with and (suspected) exposure to covid-19: Secondary | ICD-10-CM

## 2019-08-18 LAB — NOVEL CORONAVIRUS, NAA: SARS-CoV-2, NAA: NOT DETECTED

## 2019-08-19 LAB — NOVEL CORONAVIRUS, NAA: SARS-CoV-2, NAA: NOT DETECTED

## 2019-09-06 DIAGNOSIS — Z20828 Contact with and (suspected) exposure to other viral communicable diseases: Secondary | ICD-10-CM | POA: Diagnosis not present

## 2019-09-06 DIAGNOSIS — F322 Major depressive disorder, single episode, severe without psychotic features: Secondary | ICD-10-CM | POA: Diagnosis not present

## 2019-09-08 DIAGNOSIS — F6089 Other specific personality disorders: Secondary | ICD-10-CM | POA: Diagnosis not present

## 2019-09-08 DIAGNOSIS — E039 Hypothyroidism, unspecified: Secondary | ICD-10-CM | POA: Diagnosis not present

## 2019-09-08 DIAGNOSIS — F322 Major depressive disorder, single episode, severe without psychotic features: Secondary | ICD-10-CM | POA: Diagnosis not present

## 2019-09-08 DIAGNOSIS — Z79899 Other long term (current) drug therapy: Secondary | ICD-10-CM | POA: Diagnosis not present

## 2019-09-08 DIAGNOSIS — F411 Generalized anxiety disorder: Secondary | ICD-10-CM | POA: Diagnosis not present

## 2019-09-08 DIAGNOSIS — M1990 Unspecified osteoarthritis, unspecified site: Secondary | ICD-10-CM | POA: Diagnosis not present

## 2019-09-08 DIAGNOSIS — Z915 Personal history of self-harm: Secondary | ICD-10-CM | POA: Diagnosis not present

## 2019-09-08 DIAGNOSIS — M810 Age-related osteoporosis without current pathological fracture: Secondary | ICD-10-CM | POA: Diagnosis not present

## 2019-09-08 DIAGNOSIS — G8929 Other chronic pain: Secondary | ICD-10-CM | POA: Diagnosis not present

## 2019-09-08 DIAGNOSIS — F4310 Post-traumatic stress disorder, unspecified: Secondary | ICD-10-CM | POA: Diagnosis not present

## 2019-09-08 DIAGNOSIS — F333 Major depressive disorder, recurrent, severe with psychotic symptoms: Secondary | ICD-10-CM | POA: Diagnosis not present

## 2019-09-08 DIAGNOSIS — Z79890 Hormone replacement therapy (postmenopausal): Secondary | ICD-10-CM | POA: Diagnosis not present

## 2019-09-08 DIAGNOSIS — F323 Major depressive disorder, single episode, severe with psychotic features: Secondary | ICD-10-CM | POA: Diagnosis not present

## 2019-10-05 DIAGNOSIS — F4310 Post-traumatic stress disorder, unspecified: Secondary | ICD-10-CM | POA: Diagnosis not present

## 2019-10-05 DIAGNOSIS — F331 Major depressive disorder, recurrent, moderate: Secondary | ICD-10-CM | POA: Diagnosis not present

## 2019-10-05 DIAGNOSIS — F411 Generalized anxiety disorder: Secondary | ICD-10-CM | POA: Diagnosis not present

## 2019-10-05 DIAGNOSIS — F322 Major depressive disorder, single episode, severe without psychotic features: Secondary | ICD-10-CM | POA: Diagnosis not present

## 2019-10-05 DIAGNOSIS — Z20828 Contact with and (suspected) exposure to other viral communicable diseases: Secondary | ICD-10-CM | POA: Diagnosis not present

## 2019-10-06 DIAGNOSIS — F322 Major depressive disorder, single episode, severe without psychotic features: Secondary | ICD-10-CM | POA: Diagnosis not present
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
## 2019-11-10 DIAGNOSIS — Z79899 Other long term (current) drug therapy: Secondary | ICD-10-CM | POA: Diagnosis not present

## 2019-11-10 DIAGNOSIS — Z20822 Contact with and (suspected) exposure to covid-19: Secondary | ICD-10-CM | POA: Diagnosis not present

## 2019-11-10 DIAGNOSIS — F322 Major depressive disorder, single episode, severe without psychotic features: Secondary | ICD-10-CM | POA: Diagnosis not present

## 2019-11-16 DIAGNOSIS — F331 Major depressive disorder, recurrent, moderate: Secondary | ICD-10-CM | POA: Diagnosis not present

## 2019-11-16 DIAGNOSIS — F411 Generalized anxiety disorder: Secondary | ICD-10-CM | POA: Diagnosis not present

## 2019-11-16 DIAGNOSIS — F4310 Post-traumatic stress disorder, unspecified: Secondary | ICD-10-CM | POA: Diagnosis not present

## 2019-11-16 DIAGNOSIS — Z23 Encounter for immunization: Secondary | ICD-10-CM | POA: Diagnosis not present

## 2019-11-17 DIAGNOSIS — F411 Generalized anxiety disorder: Secondary | ICD-10-CM | POA: Diagnosis not present

## 2019-11-17 DIAGNOSIS — F331 Major depressive disorder, recurrent, moderate: Secondary | ICD-10-CM | POA: Diagnosis not present

## 2019-11-20 ENCOUNTER — Ambulatory Visit: Payer: BC Managed Care – PPO | Admitting: Neurology

## 2019-11-23 ENCOUNTER — Ambulatory Visit: Payer: Self-pay | Attending: Internal Medicine

## 2019-11-23 DIAGNOSIS — Z20822 Contact with and (suspected) exposure to covid-19: Secondary | ICD-10-CM | POA: Insufficient documentation

## 2019-11-24 LAB — NOVEL CORONAVIRUS, NAA: SARS-CoV-2, NAA: NOT DETECTED

## 2019-12-01 DIAGNOSIS — H524 Presbyopia: Secondary | ICD-10-CM | POA: Diagnosis not present

## 2019-12-01 DIAGNOSIS — H2513 Age-related nuclear cataract, bilateral: Secondary | ICD-10-CM | POA: Diagnosis not present

## 2019-12-14 DIAGNOSIS — M810 Age-related osteoporosis without current pathological fracture: Secondary | ICD-10-CM | POA: Diagnosis not present

## 2019-12-14 DIAGNOSIS — F411 Generalized anxiety disorder: Secondary | ICD-10-CM | POA: Diagnosis not present

## 2019-12-14 DIAGNOSIS — F331 Major depressive disorder, recurrent, moderate: Secondary | ICD-10-CM | POA: Diagnosis not present

## 2019-12-14 DIAGNOSIS — E559 Vitamin D deficiency, unspecified: Secondary | ICD-10-CM | POA: Diagnosis not present

## 2019-12-15 DIAGNOSIS — Z20822 Contact with and (suspected) exposure to covid-19: Secondary | ICD-10-CM | POA: Diagnosis not present

## 2019-12-15 DIAGNOSIS — F322 Major depressive disorder, single episode, severe without psychotic features: Secondary | ICD-10-CM | POA: Diagnosis not present

## 2020-01-02 DIAGNOSIS — R600 Localized edema: Secondary | ICD-10-CM | POA: Diagnosis not present

## 2020-01-02 DIAGNOSIS — Z125 Encounter for screening for malignant neoplasm of prostate: Secondary | ICD-10-CM | POA: Diagnosis not present

## 2020-01-02 DIAGNOSIS — Z0000 Encounter for general adult medical examination without abnormal findings: Secondary | ICD-10-CM | POA: Diagnosis not present

## 2020-01-02 DIAGNOSIS — Z13220 Encounter for screening for lipoid disorders: Secondary | ICD-10-CM | POA: Diagnosis not present

## 2020-01-04 DIAGNOSIS — F4310 Post-traumatic stress disorder, unspecified: Secondary | ICD-10-CM | POA: Diagnosis not present

## 2020-01-04 DIAGNOSIS — F331 Major depressive disorder, recurrent, moderate: Secondary | ICD-10-CM | POA: Diagnosis not present

## 2020-01-04 DIAGNOSIS — F411 Generalized anxiety disorder: Secondary | ICD-10-CM | POA: Diagnosis not present

## 2020-01-09 DIAGNOSIS — M810 Age-related osteoporosis without current pathological fracture: Secondary | ICD-10-CM | POA: Diagnosis not present

## 2020-01-09 DIAGNOSIS — E039 Hypothyroidism, unspecified: Secondary | ICD-10-CM | POA: Diagnosis not present

## 2020-01-09 DIAGNOSIS — F411 Generalized anxiety disorder: Secondary | ICD-10-CM | POA: Diagnosis not present

## 2020-01-11 DIAGNOSIS — F331 Major depressive disorder, recurrent, moderate: Secondary | ICD-10-CM | POA: Diagnosis not present

## 2020-01-11 DIAGNOSIS — F411 Generalized anxiety disorder: Secondary | ICD-10-CM | POA: Diagnosis not present

## 2020-01-26 DIAGNOSIS — F322 Major depressive disorder, single episode, severe without psychotic features: Secondary | ICD-10-CM | POA: Diagnosis not present

## 2020-01-26 DIAGNOSIS — Z20822 Contact with and (suspected) exposure to covid-19: Secondary | ICD-10-CM | POA: Diagnosis not present

## 2020-02-06 DIAGNOSIS — E039 Hypothyroidism, unspecified: Secondary | ICD-10-CM | POA: Diagnosis not present

## 2020-02-07 DIAGNOSIS — Z03818 Encounter for observation for suspected exposure to other biological agents ruled out: Secondary | ICD-10-CM | POA: Diagnosis not present

## 2020-02-13 DIAGNOSIS — F331 Major depressive disorder, recurrent, moderate: Secondary | ICD-10-CM | POA: Diagnosis not present

## 2020-02-13 DIAGNOSIS — F411 Generalized anxiety disorder: Secondary | ICD-10-CM | POA: Diagnosis not present

## 2020-02-15 DIAGNOSIS — F411 Generalized anxiety disorder: Secondary | ICD-10-CM | POA: Diagnosis not present

## 2020-02-15 DIAGNOSIS — F331 Major depressive disorder, recurrent, moderate: Secondary | ICD-10-CM | POA: Diagnosis not present

## 2020-02-15 DIAGNOSIS — F4310 Post-traumatic stress disorder, unspecified: Secondary | ICD-10-CM | POA: Diagnosis not present

## 2020-02-26 DIAGNOSIS — H1032 Unspecified acute conjunctivitis, left eye: Secondary | ICD-10-CM | POA: Diagnosis not present

## 2020-03-08 DIAGNOSIS — F322 Major depressive disorder, single episode, severe without psychotic features: Secondary | ICD-10-CM | POA: Diagnosis not present

## 2020-03-08 DIAGNOSIS — Z20822 Contact with and (suspected) exposure to covid-19: Secondary | ICD-10-CM | POA: Diagnosis not present

## 2020-03-12 DIAGNOSIS — F4310 Post-traumatic stress disorder, unspecified: Secondary | ICD-10-CM | POA: Diagnosis not present

## 2020-03-12 DIAGNOSIS — F331 Major depressive disorder, recurrent, moderate: Secondary | ICD-10-CM | POA: Diagnosis not present

## 2020-03-12 DIAGNOSIS — F411 Generalized anxiety disorder: Secondary | ICD-10-CM | POA: Diagnosis not present

## 2020-03-19 DIAGNOSIS — E039 Hypothyroidism, unspecified: Secondary | ICD-10-CM | POA: Diagnosis not present

## 2020-04-17 DIAGNOSIS — Z79890 Hormone replacement therapy (postmenopausal): Secondary | ICD-10-CM | POA: Diagnosis not present

## 2020-04-17 DIAGNOSIS — M1990 Unspecified osteoarthritis, unspecified site: Secondary | ICD-10-CM | POA: Diagnosis not present

## 2020-04-17 DIAGNOSIS — F322 Major depressive disorder, single episode, severe without psychotic features: Secondary | ICD-10-CM | POA: Diagnosis not present

## 2020-04-17 DIAGNOSIS — G8929 Other chronic pain: Secondary | ICD-10-CM | POA: Diagnosis not present

## 2020-04-17 DIAGNOSIS — M549 Dorsalgia, unspecified: Secondary | ICD-10-CM | POA: Diagnosis not present

## 2020-04-17 DIAGNOSIS — M810 Age-related osteoporosis without current pathological fracture: Secondary | ICD-10-CM | POA: Diagnosis not present

## 2020-04-17 DIAGNOSIS — Z79899 Other long term (current) drug therapy: Secondary | ICD-10-CM | POA: Diagnosis not present

## 2020-04-17 DIAGNOSIS — E039 Hypothyroidism, unspecified: Secondary | ICD-10-CM | POA: Diagnosis not present

## 2020-04-30 DIAGNOSIS — E039 Hypothyroidism, unspecified: Secondary | ICD-10-CM | POA: Diagnosis not present

## 2020-05-02 DIAGNOSIS — F331 Major depressive disorder, recurrent, moderate: Secondary | ICD-10-CM | POA: Diagnosis not present

## 2020-05-02 DIAGNOSIS — F411 Generalized anxiety disorder: Secondary | ICD-10-CM | POA: Diagnosis not present

## 2020-05-07 DIAGNOSIS — F411 Generalized anxiety disorder: Secondary | ICD-10-CM | POA: Diagnosis not present

## 2020-05-07 DIAGNOSIS — F4310 Post-traumatic stress disorder, unspecified: Secondary | ICD-10-CM | POA: Diagnosis not present

## 2020-05-07 DIAGNOSIS — F331 Major depressive disorder, recurrent, moderate: Secondary | ICD-10-CM | POA: Diagnosis not present

## 2020-05-31 DIAGNOSIS — G8929 Other chronic pain: Secondary | ICD-10-CM | POA: Diagnosis not present

## 2020-05-31 DIAGNOSIS — E039 Hypothyroidism, unspecified: Secondary | ICD-10-CM | POA: Diagnosis not present

## 2020-05-31 DIAGNOSIS — M1990 Unspecified osteoarthritis, unspecified site: Secondary | ICD-10-CM | POA: Diagnosis not present

## 2020-05-31 DIAGNOSIS — F411 Generalized anxiety disorder: Secondary | ICD-10-CM | POA: Diagnosis not present

## 2020-05-31 DIAGNOSIS — F4310 Post-traumatic stress disorder, unspecified: Secondary | ICD-10-CM | POA: Diagnosis not present

## 2020-05-31 DIAGNOSIS — F322 Major depressive disorder, single episode, severe without psychotic features: Secondary | ICD-10-CM | POA: Diagnosis not present

## 2020-05-31 DIAGNOSIS — Z79890 Hormone replacement therapy (postmenopausal): Secondary | ICD-10-CM | POA: Diagnosis not present

## 2020-05-31 DIAGNOSIS — F607 Dependent personality disorder: Secondary | ICD-10-CM | POA: Diagnosis not present

## 2020-05-31 DIAGNOSIS — F333 Major depressive disorder, recurrent, severe with psychotic symptoms: Secondary | ICD-10-CM | POA: Diagnosis not present

## 2020-05-31 DIAGNOSIS — Z915 Personal history of self-harm: Secondary | ICD-10-CM | POA: Diagnosis not present

## 2020-05-31 DIAGNOSIS — M549 Dorsalgia, unspecified: Secondary | ICD-10-CM | POA: Diagnosis not present

## 2020-05-31 DIAGNOSIS — Z79899 Other long term (current) drug therapy: Secondary | ICD-10-CM | POA: Diagnosis not present

## 2020-06-04 DIAGNOSIS — F411 Generalized anxiety disorder: Secondary | ICD-10-CM | POA: Diagnosis not present

## 2020-06-04 DIAGNOSIS — F331 Major depressive disorder, recurrent, moderate: Secondary | ICD-10-CM | POA: Diagnosis not present

## 2020-06-05 DIAGNOSIS — R5383 Other fatigue: Secondary | ICD-10-CM | POA: Diagnosis not present

## 2020-06-05 DIAGNOSIS — M810 Age-related osteoporosis without current pathological fracture: Secondary | ICD-10-CM | POA: Diagnosis not present

## 2020-06-05 DIAGNOSIS — E559 Vitamin D deficiency, unspecified: Secondary | ICD-10-CM | POA: Diagnosis not present

## 2020-06-18 DIAGNOSIS — E559 Vitamin D deficiency, unspecified: Secondary | ICD-10-CM | POA: Diagnosis not present

## 2020-06-18 DIAGNOSIS — M810 Age-related osteoporosis without current pathological fracture: Secondary | ICD-10-CM | POA: Diagnosis not present

## 2020-07-16 DIAGNOSIS — F331 Major depressive disorder, recurrent, moderate: Secondary | ICD-10-CM | POA: Diagnosis not present

## 2020-07-16 DIAGNOSIS — F411 Generalized anxiety disorder: Secondary | ICD-10-CM | POA: Diagnosis not present

## 2020-07-17 DIAGNOSIS — Z79899 Other long term (current) drug therapy: Secondary | ICD-10-CM | POA: Diagnosis not present

## 2020-07-17 DIAGNOSIS — F322 Major depressive disorder, single episode, severe without psychotic features: Secondary | ICD-10-CM | POA: Diagnosis not present

## 2020-07-17 DIAGNOSIS — R569 Unspecified convulsions: Secondary | ICD-10-CM | POA: Diagnosis not present

## 2020-07-18 DIAGNOSIS — E039 Hypothyroidism, unspecified: Secondary | ICD-10-CM | POA: Diagnosis not present

## 2020-07-18 DIAGNOSIS — Z0001 Encounter for general adult medical examination with abnormal findings: Secondary | ICD-10-CM | POA: Diagnosis not present

## 2020-07-18 DIAGNOSIS — Z13220 Encounter for screening for lipoid disorders: Secondary | ICD-10-CM | POA: Diagnosis not present

## 2020-07-26 DIAGNOSIS — Z23 Encounter for immunization: Secondary | ICD-10-CM | POA: Diagnosis not present

## 2020-07-26 DIAGNOSIS — E039 Hypothyroidism, unspecified: Secondary | ICD-10-CM | POA: Diagnosis not present

## 2020-07-26 DIAGNOSIS — F411 Generalized anxiety disorder: Secondary | ICD-10-CM | POA: Diagnosis not present

## 2020-07-26 DIAGNOSIS — Z0000 Encounter for general adult medical examination without abnormal findings: Secondary | ICD-10-CM | POA: Diagnosis not present

## 2020-07-26 DIAGNOSIS — F39 Unspecified mood [affective] disorder: Secondary | ICD-10-CM | POA: Diagnosis not present

## 2020-07-30 DIAGNOSIS — F411 Generalized anxiety disorder: Secondary | ICD-10-CM | POA: Diagnosis not present

## 2020-07-30 DIAGNOSIS — F331 Major depressive disorder, recurrent, moderate: Secondary | ICD-10-CM | POA: Diagnosis not present

## 2020-07-30 DIAGNOSIS — F4310 Post-traumatic stress disorder, unspecified: Secondary | ICD-10-CM | POA: Diagnosis not present

## 2020-08-19 DIAGNOSIS — F331 Major depressive disorder, recurrent, moderate: Secondary | ICD-10-CM | POA: Diagnosis not present

## 2020-08-19 DIAGNOSIS — F411 Generalized anxiety disorder: Secondary | ICD-10-CM | POA: Diagnosis not present

## 2020-08-28 DIAGNOSIS — F333 Major depressive disorder, recurrent, severe with psychotic symptoms: Secondary | ICD-10-CM | POA: Diagnosis not present

## 2020-08-28 DIAGNOSIS — E039 Hypothyroidism, unspecified: Secondary | ICD-10-CM | POA: Diagnosis not present

## 2020-08-28 DIAGNOSIS — F4310 Post-traumatic stress disorder, unspecified: Secondary | ICD-10-CM | POA: Diagnosis not present

## 2020-08-28 DIAGNOSIS — Z9151 Personal history of suicidal behavior: Secondary | ICD-10-CM | POA: Diagnosis not present

## 2020-08-28 DIAGNOSIS — G479 Sleep disorder, unspecified: Secondary | ICD-10-CM | POA: Diagnosis not present

## 2020-08-28 DIAGNOSIS — F411 Generalized anxiety disorder: Secondary | ICD-10-CM | POA: Diagnosis not present

## 2020-08-28 DIAGNOSIS — F322 Major depressive disorder, single episode, severe without psychotic features: Secondary | ICD-10-CM | POA: Diagnosis not present

## 2020-08-28 DIAGNOSIS — Z780 Asymptomatic menopausal state: Secondary | ICD-10-CM | POA: Diagnosis not present

## 2020-08-28 DIAGNOSIS — M1990 Unspecified osteoarthritis, unspecified site: Secondary | ICD-10-CM | POA: Diagnosis not present

## 2020-09-09 DIAGNOSIS — M21621 Bunionette of right foot: Secondary | ICD-10-CM | POA: Diagnosis not present

## 2020-09-17 ENCOUNTER — Ambulatory Visit (INDEPENDENT_AMBULATORY_CARE_PROVIDER_SITE_OTHER): Payer: BC Managed Care – PPO

## 2020-09-17 ENCOUNTER — Encounter: Payer: Self-pay | Admitting: Podiatry

## 2020-09-17 ENCOUNTER — Other Ambulatory Visit: Payer: Self-pay | Admitting: Podiatry

## 2020-09-17 ENCOUNTER — Ambulatory Visit: Payer: BC Managed Care – PPO | Admitting: Podiatry

## 2020-09-17 ENCOUNTER — Other Ambulatory Visit: Payer: Self-pay

## 2020-09-17 DIAGNOSIS — N809 Endometriosis, unspecified: Secondary | ICD-10-CM | POA: Insufficient documentation

## 2020-09-17 DIAGNOSIS — M549 Dorsalgia, unspecified: Secondary | ICD-10-CM | POA: Insufficient documentation

## 2020-09-17 DIAGNOSIS — E039 Hypothyroidism, unspecified: Secondary | ICD-10-CM | POA: Insufficient documentation

## 2020-09-17 DIAGNOSIS — H04123 Dry eye syndrome of bilateral lacrimal glands: Secondary | ICD-10-CM | POA: Insufficient documentation

## 2020-09-17 DIAGNOSIS — G8929 Other chronic pain: Secondary | ICD-10-CM | POA: Insufficient documentation

## 2020-09-17 DIAGNOSIS — Z01419 Encounter for gynecological examination (general) (routine) without abnormal findings: Secondary | ICD-10-CM | POA: Diagnosis not present

## 2020-09-17 DIAGNOSIS — B0052 Herpesviral keratitis: Secondary | ICD-10-CM | POA: Insufficient documentation

## 2020-09-17 DIAGNOSIS — S92344A Nondisplaced fracture of fourth metatarsal bone, right foot, initial encounter for closed fracture: Secondary | ICD-10-CM

## 2020-09-17 DIAGNOSIS — M778 Other enthesopathies, not elsewhere classified: Secondary | ICD-10-CM

## 2020-09-17 DIAGNOSIS — Z1231 Encounter for screening mammogram for malignant neoplasm of breast: Secondary | ICD-10-CM | POA: Diagnosis not present

## 2020-09-17 DIAGNOSIS — M1990 Unspecified osteoarthritis, unspecified site: Secondary | ICD-10-CM | POA: Insufficient documentation

## 2020-09-17 DIAGNOSIS — M810 Age-related osteoporosis without current pathological fracture: Secondary | ICD-10-CM | POA: Insufficient documentation

## 2020-09-17 DIAGNOSIS — Z6828 Body mass index (BMI) 28.0-28.9, adult: Secondary | ICD-10-CM | POA: Diagnosis not present

## 2020-09-17 NOTE — Progress Notes (Signed)
Subjective:  Patient ID: Carla [name], female    DOB: [birth year],  MRN: [id number] HPI Chief Complaint  Patient presents with  . Foot Pain    4th met dorsally right - aching, swelling x 1 week, no injury, saw PA at [facility] no fractures, but still sore  . New Patient (Initial Visit)    Est pt 71    59 y.o. female presents with the above complaint.   ROS: Denies fever chills nausea vomiting muscle aches pains calf pain back pain chest pain shortness of breath.  Past Medical History:  Diagnosis Date  . Allergy   . Anxiety   . Arthritis   . Depression   . Endometriosis   . Episode of heavy vaginal bleeding 03/31/2015  . Gait abnormality 05/11/2019  . Hypothyroidism   . Osteoporosis   . Thyroid disease    hypothyroidism  . Thyroid disease    Past Surgical History:  Procedure Laterality Date  . BACK SURGERY    . COLONOSCOPY  04/28/11   Normal  . cyst left 2nd finger    . DILATION AND CURETTAGE OF UTERUS    . DILITATION & CURRETTAGE/HYSTROSCOPY WITH NOVASURE ABLATION N/A 03/14/2015   Procedure: DILATATION & CURETTAGE/HYSTEROSCOPY WITH POSSIBLE NOVASURE ABLATION;  Surgeon: Brien Few, MD;  Location: [facility];  Service: Gynecology;  Laterality: N/A;  . LAPAROSCOPIC APPENDECTOMY N/A 01/02/2019   Procedure: APPENDECTOMY LAPAROSCOPIC;  Surgeon: Alphonsa Overall, MD;  Location: [facility];  Service: General;  Laterality: N/A;  . LAPAROSCOPY ABDOMEN DIAGNOSTIC    . left shoulder arthroscopy    . LUMBAR DISC SURGERY    . SHOULDER SURGERY      Current Outpatient Medications:  .  levothyroxine (SYNTHROID) 137 MCG tablet, Take 137 mcg by mouth daily before breakfast., Disp: , Rfl:  .  mirtazapine (REMERON) 7.5 MG tablet, Take 7.5 mg by mouth at bedtime., Disp: , Rfl:  .  OLANZapine (ZYPREXA) 10 MG tablet, Take 10 mg by mouth at bedtime., Disp: , Rfl:  .  acetaminophen (TYLENOL) 325 MG tablet, You can take 2 tablets every 4 hours as needed for pain.  You can alternate this  with ibuprofen.  You can buy this over-the-counter at any drugstore. DO NOT TAKE MORE THAN 4000 MG OF TYLENOL PER DAY.  IT CAN HARM YOUR LIVER.  TYLENOL (ACETAMINOPHEN) IS ALSO IN YOUR PRESCRIPTION PAIN MEDICATION.  YOU HAVE TO COUNT IT IN YOUR DAILY TOTAL., Disp: , Rfl:  .  celecoxib (CELEBREX) 200 MG capsule, Take 200 mg by mouth daily., Disp: , Rfl:  .  cyclobenzaprine (FLEXERIL) 10 MG tablet, Take 10 mg by mouth at bedtime., Disp: , Rfl: 3 .  desvenlafaxine (PRISTIQ) 50 MG 24 hr tablet, Take 50 mg by mouth daily., Disp: , Rfl: 1 .  docusate sodium (COLACE) 100 MG capsule, Take 100 mg by mouth 2 (two) times daily. , Disp: , Rfl:  .  gabapentin (NEURONTIN) 600 MG tablet, , Disp: , Rfl:  .  Inositol 500 MG TABS, Take 500 mg by mouth daily., Disp: , Rfl:  .  L-Methylfolate 15 MG TABS, Take 15 mg by mouth daily. , Disp: , Rfl:  .  Melatonin 5 MG CAPS, Take 1 capsule by mouth at bedtime., Disp: , Rfl:  .  PENNSAID 2 % SOLN, SMARTSIG:2 Pump Topical Twice Daily, Disp: , Rfl:  .  ramelteon (ROZEREM) 8 MG tablet, Take 1 tablet (8 mg total) by mouth at bedtime., Disp: 30 tablet, Rfl: 0 .  traZODone (  DESYREL) 100 MG tablet, Take 150 mg by mouth at bedtime., Disp: , Rfl: 1 .  valACYclovir (VALTREX) 1000 MG tablet, Take 1 g by mouth daily., Disp: , Rfl: 1  Allergies  Allergen Reactions  . Clarithromycin Nausea And Vomiting  . Codeine Other (See Comments)    headache  . Codeine Other (See Comments)    h/a  . Erythromycin Nausea And Vomiting  . Erythromycin Nausea And Vomiting   Review of Systems Objective:  There were no vitals filed for this visit.  General: Well developed, nourished, in no acute distress, alert and oriented x3   Dermatological: Skin is warm, dry and supple bilateral. Nails x 10 are well maintained; remaining integument appears unremarkable at this time. There are no open sores, no preulcerative lesions, no rash or signs of infection present.  Vascular: Dorsalis Pedis artery  and Posterior Tibial artery pedal pulses are 2/4 bilateral with immedate capillary fill time. Pedal hair growth present. No varicosities and no lower extremity edema present bilateral.   Neruologic: Grossly intact via light touch bilateral. Vibratory intact via tuning fork bilateral. Protective threshold with Semmes Wienstein monofilament intact to all pedal sites bilateral. Patellar and Achilles deep tendon reflexes 2+ bilateral. No Babinski or clonus noted bilateral.   Musculoskeletal: No gross boney pedal deformities bilateral. No pain, crepitus, or limitation noted with foot and ankle range of motion bilateral. Muscular strength 5/5 in all groups tested bilateral. She has pain on palpation with minimal edema dorsal aspect of the right foot overlying the base of the fourth metatarsal.  Gait: Unassisted, Nonantalgic.    Radiographs:  Radiographs taken today demonstrate an osseously mature individual with osteoarthritic changes of the head of the first metatarsal metatarsophalangeal joint. She also has a transverse fracture at the base of the fourth metatarsal of the right foot with some periosteal fluffing  Assessment & Plan:   Assessment: Fracture nondisplaced noncomminuted fourth metatarsal base right foot. Osteoarthritis first metatarsophalangeal joint right foot.  Plan: Placed her in a cam walker we will follow-up with her in 5 weeks for set of x-rays     Chancelor Hardrick T. [place], [place]

## 2020-09-18 DIAGNOSIS — F4310 Post-traumatic stress disorder, unspecified: Secondary | ICD-10-CM | POA: Diagnosis not present

## 2020-09-18 DIAGNOSIS — F331 Major depressive disorder, recurrent, moderate: Secondary | ICD-10-CM | POA: Diagnosis not present

## 2020-09-18 DIAGNOSIS — F411 Generalized anxiety disorder: Secondary | ICD-10-CM | POA: Diagnosis not present

## 2020-09-24 DIAGNOSIS — F411 Generalized anxiety disorder: Secondary | ICD-10-CM | POA: Diagnosis not present

## 2020-09-24 DIAGNOSIS — F331 Major depressive disorder, recurrent, moderate: Secondary | ICD-10-CM | POA: Diagnosis not present

## 2020-10-09 ENCOUNTER — Other Ambulatory Visit: Payer: Self-pay

## 2020-10-09 ENCOUNTER — Ambulatory Visit: Payer: BC Managed Care – PPO | Admitting: Podiatry

## 2020-10-09 ENCOUNTER — Ambulatory Visit (INDEPENDENT_AMBULATORY_CARE_PROVIDER_SITE_OTHER): Payer: BC Managed Care – PPO

## 2020-10-09 DIAGNOSIS — S92344A Nondisplaced fracture of fourth metatarsal bone, right foot, initial encounter for closed fracture: Secondary | ICD-10-CM

## 2020-10-14 ENCOUNTER — Encounter: Payer: Self-pay | Admitting: Podiatry

## 2020-10-14 DIAGNOSIS — M810 Age-related osteoporosis without current pathological fracture: Secondary | ICD-10-CM | POA: Diagnosis not present

## 2020-10-14 DIAGNOSIS — E039 Hypothyroidism, unspecified: Secondary | ICD-10-CM | POA: Diagnosis not present

## 2020-10-14 DIAGNOSIS — F322 Major depressive disorder, single episode, severe without psychotic features: Secondary | ICD-10-CM | POA: Diagnosis not present

## 2020-10-14 DIAGNOSIS — Z79899 Other long term (current) drug therapy: Secondary | ICD-10-CM | POA: Diagnosis not present

## 2020-10-14 DIAGNOSIS — M1990 Unspecified osteoarthritis, unspecified site: Secondary | ICD-10-CM | POA: Diagnosis not present

## 2020-10-14 NOTE — Progress Notes (Signed)
Subjective:  Patient ID: Carla [name], female    DOB: 1961/08/24,  MRN: [id number] HPI Chief Complaint  Patient presents with  . Fracture    Pt stated that  she is doing okay she just wants to make sure it is healing okay    59 y.o. female presents with the above complaint.  She states her pain is about 80% improved  ROS: Denies fever chills nausea vomiting muscle aches pains calf pain back pain chest pain shortness of breath.  Past Medical History:  Diagnosis Date  . Allergy   . Anxiety   . Arthritis   . Depression   . Endometriosis   . Episode of heavy vaginal bleeding 03/31/2015  . Gait abnormality 05/11/2019  . Hypothyroidism   . Osteoporosis   . Thyroid disease    hypothyroidism  . Thyroid disease    Past Surgical History:  Procedure Laterality Date  . BACK SURGERY    . COLONOSCOPY  04/28/11   Normal  . cyst left 2nd finger    . DILATION AND CURETTAGE OF UTERUS    . DILITATION & CURRETTAGE/HYSTROSCOPY WITH NOVASURE ABLATION N/A 03/14/2015   Procedure: DILATATION & CURETTAGE/HYSTEROSCOPY WITH POSSIBLE NOVASURE ABLATION;  Surgeon: Olivia Mackie, MD;  Location: [facility];  Service: Gynecology;  Laterality: N/A;  . LAPAROSCOPIC APPENDECTOMY N/A 01/02/2019   Procedure: APPENDECTOMY LAPAROSCOPIC;  Surgeon: Ovidio Kin, MD;  Location: [facility];  Service: General;  Laterality: N/A;  . LAPAROSCOPY ABDOMEN DIAGNOSTIC    . left shoulder arthroscopy    . LUMBAR DISC SURGERY    . SHOULDER SURGERY      Current Outpatient Medications:  .  acetaminophen (TYLENOL) 325 MG tablet, You can take 2 tablets every 4 hours as needed for pain.  You can alternate this with ibuprofen.  You can buy this over-the-counter at any drugstore. DO NOT TAKE MORE THAN 4000 MG OF TYLENOL PER DAY.  IT CAN HARM YOUR LIVER.  TYLENOL (ACETAMINOPHEN) IS ALSO IN YOUR PRESCRIPTION PAIN MEDICATION.  YOU HAVE TO COUNT IT IN YOUR DAILY TOTAL., Disp: , Rfl:  .  celecoxib (CELEBREX) 200 MG capsule, Take 200 mg by  mouth daily., Disp: , Rfl:  .  cyclobenzaprine (FLEXERIL) 10 MG tablet, Take 10 mg by mouth at bedtime., Disp: , Rfl: 3 .  desvenlafaxine (PRISTIQ) 50 MG 24 hr tablet, Take 50 mg by mouth daily., Disp: , Rfl: 1 .  docusate sodium (COLACE) 100 MG capsule, Take 100 mg by mouth 2 (two) times daily. , Disp: , Rfl:  .  gabapentin (NEURONTIN) 600 MG tablet, , Disp: , Rfl:  .  Inositol 500 MG TABS, Take 500 mg by mouth daily., Disp: , Rfl:  .  L-Methylfolate 15 MG TABS, Take 15 mg by mouth daily. , Disp: , Rfl:  .  levothyroxine (SYNTHROID) 137 MCG tablet, Take 137 mcg by mouth daily before breakfast., Disp: , Rfl:  .  Melatonin 5 MG CAPS, Take 1 capsule by mouth at bedtime., Disp: , Rfl:  .  mirtazapine (REMERON) 7.5 MG tablet, Take 7.5 mg by mouth at bedtime., Disp: , Rfl:  .  OLANZapine (ZYPREXA) 10 MG tablet, Take 10 mg by mouth at bedtime., Disp: , Rfl:  .  PENNSAID 2 % SOLN, SMARTSIG:2 Pump Topical Twice Daily, Disp: , Rfl:  .  ramelteon (ROZEREM) 8 MG tablet, Take 1 tablet (8 mg total) by mouth at bedtime., Disp: 30 tablet, Rfl: 0 .  traZODone (DESYREL) 100 MG tablet, Take 150 mg by  mouth at bedtime., Disp: , Rfl: 1 .  valACYclovir (VALTREX) 1000 MG tablet, Take 1 g by mouth daily., Disp: , Rfl: 1  Allergies  Allergen Reactions  . Clarithromycin Nausea And Vomiting  . Codeine Other (See Comments)    headache  . Codeine Other (See Comments)    h/a  . Erythromycin Nausea And Vomiting  . Erythromycin Nausea And Vomiting   Review of Systems Objective:  There were no vitals filed for this visit.  General: Well developed, nourished, in no acute distress, alert and oriented x3   Dermatological: Skin is warm, dry and supple bilateral. Nails x 10 are well maintained; remaining integument appears unremarkable at this time. There are no open sores, no preulcerative lesions, no rash or signs of infection present.  Vascular: Dorsalis Pedis artery and Posterior Tibial artery pedal pulses are 2/4  bilateral with immedate capillary fill time. Pedal hair growth present. No varicosities and no lower extremity edema present bilateral.   Neruologic: Grossly intact via light touch bilateral. Vibratory intact via tuning fork bilateral. Protective threshold with Semmes Wienstein monofilament intact to all pedal sites bilateral. Patellar and Achilles deep tendon reflexes 2+ bilateral. No Babinski or clonus noted bilateral.   Musculoskeletal: No gross boney pedal deformities bilateral. No pain, crepitus, or limitation noted with foot and ankle range of motion bilateral. Muscular strength 5/5 in all groups tested bilateral. She has pain on palpation with minimal edema dorsal aspect of the right foot overlying the base of the fourth metatarsal.  Gait: Unassisted, Nonantalgic.    Radiographs:  Radiographs taken today demonstrate an osseously mature individual with osteoarthritic changes of the head of the first metatarsal metatarsophalangeal joint. She also has a transverse fracture at the base of the fourth metatarsal of the right foot with some periosteal fluffing  Assessment & Plan:   Assessment: Fracture nondisplaced noncomminuted fourth metatarsal base right foot. Osteoarthritis first metatarsophalangeal joint right foot.  Plan: Continue utilizing cam boot for another 4 weeks.  80% improved.  As long as her pain is clinically improving we will may begin transitioning her out of cam boot.  She will be scheduled to see Dr. Al Corpus again in 4 weeks.

## 2020-10-15 ENCOUNTER — Ambulatory Visit: Payer: BC Managed Care – PPO | Admitting: Podiatry

## 2020-10-24 DIAGNOSIS — F331 Major depressive disorder, recurrent, moderate: Secondary | ICD-10-CM | POA: Diagnosis not present

## 2020-10-24 DIAGNOSIS — F411 Generalized anxiety disorder: Secondary | ICD-10-CM | POA: Diagnosis not present

## 2020-11-06 ENCOUNTER — Other Ambulatory Visit: Payer: Self-pay

## 2020-11-06 ENCOUNTER — Ambulatory Visit (INDEPENDENT_AMBULATORY_CARE_PROVIDER_SITE_OTHER): Payer: BC Managed Care – PPO

## 2020-11-06 ENCOUNTER — Ambulatory Visit: Payer: BC Managed Care – PPO | Admitting: Podiatry

## 2020-11-06 ENCOUNTER — Encounter: Payer: Self-pay | Admitting: Podiatry

## 2020-11-06 DIAGNOSIS — S92344A Nondisplaced fracture of fourth metatarsal bone, right foot, initial encounter for closed fracture: Secondary | ICD-10-CM

## 2020-11-06 NOTE — Progress Notes (Signed)
Subjective:  Patient ID: Carla [name], female    DOB: [birth year],  MRN: [id number] HPI Chief Complaint  Patient presents with  . Fracture    Right foot fracture, PT stated that she is doing a lot better she has no concerns at this time and denies any pain     60 y.o. female presents with the above complaint.  She states her pain is about 100% mproved  ROS: Denies fever chills nausea vomiting muscle aches pains calf pain back pain chest pain shortness of breath.  Past Medical History:  Diagnosis Date  . Allergy   . Anxiety   . Arthritis   . Depression   . Endometriosis   . Episode of heavy vaginal bleeding 03/31/2015  . Gait abnormality 05/11/2019  . Hypothyroidism   . Osteoporosis   . Thyroid disease    hypothyroidism  . Thyroid disease    Past Surgical History:  Procedure Laterality Date  . BACK SURGERY    . COLONOSCOPY  04/28/11   Normal  . cyst left 2nd finger    . DILATION AND CURETTAGE OF UTERUS    . DILITATION & CURRETTAGE/HYSTROSCOPY WITH NOVASURE ABLATION N/A 03/14/2015   Procedure: DILATATION & CURETTAGE/HYSTEROSCOPY WITH POSSIBLE NOVASURE ABLATION;  Surgeon: Olivia Mackie, MD;  Location: [facility];  Service: Gynecology;  Laterality: N/A;  . LAPAROSCOPIC APPENDECTOMY N/A 01/02/2019   Procedure: APPENDECTOMY LAPAROSCOPIC;  Surgeon: Ovidio Kin, MD;  Location: [facility];  Service: General;  Laterality: N/A;  . LAPAROSCOPY ABDOMEN DIAGNOSTIC    . left shoulder arthroscopy    . LUMBAR DISC SURGERY    . SHOULDER SURGERY      Current Outpatient Medications:  .  acetaminophen (TYLENOL) 325 MG tablet, You can take 2 tablets every 4 hours as needed for pain.  You can alternate this with ibuprofen.  You can buy this over-the-counter at any drugstore. DO NOT TAKE MORE THAN 4000 MG OF TYLENOL PER DAY.  IT CAN HARM YOUR LIVER.  TYLENOL (ACETAMINOPHEN) IS ALSO IN YOUR PRESCRIPTION PAIN MEDICATION.  YOU HAVE TO COUNT IT IN YOUR DAILY TOTAL., Disp: , Rfl:  .  celecoxib  (CELEBREX) 200 MG capsule, Take 200 mg by mouth daily., Disp: , Rfl:  .  cyclobenzaprine (FLEXERIL) 10 MG tablet, Take 10 mg by mouth at bedtime., Disp: , Rfl: 3 .  desvenlafaxine (PRISTIQ) 50 MG 24 hr tablet, Take 50 mg by mouth daily., Disp: , Rfl: 1 .  docusate sodium (COLACE) 100 MG capsule, Take 100 mg by mouth 2 (two) times daily. , Disp: , Rfl:  .  gabapentin (NEURONTIN) 600 MG tablet, , Disp: , Rfl:  .  Inositol 500 MG TABS, Take 500 mg by mouth daily., Disp: , Rfl:  .  L-Methylfolate 15 MG TABS, Take 15 mg by mouth daily. , Disp: , Rfl:  .  levothyroxine (SYNTHROID) 137 MCG tablet, Take 137 mcg by mouth daily before breakfast., Disp: , Rfl:  .  Melatonin 5 MG CAPS, Take 1 capsule by mouth at bedtime., Disp: , Rfl:  .  mirtazapine (REMERON) 7.5 MG tablet, Take 7.5 mg by mouth at bedtime., Disp: , Rfl:  .  OLANZapine (ZYPREXA) 10 MG tablet, Take 10 mg by mouth at bedtime., Disp: , Rfl:  .  PENNSAID 2 % SOLN, SMARTSIG:2 Pump Topical Twice Daily, Disp: , Rfl:  .  ramelteon (ROZEREM) 8 MG tablet, Take 1 tablet (8 mg total) by mouth at bedtime., Disp: 30 tablet, Rfl: 0 .  traZODone (DESYREL) 100  MG tablet, Take 150 mg by mouth at bedtime., Disp: , Rfl: 1 .  valACYclovir (VALTREX) 1000 MG tablet, Take 1 g by mouth daily., Disp: , Rfl: 1  Allergies  Allergen Reactions  . Clarithromycin Nausea And Vomiting  . Codeine Other (See Comments)    headache  . Codeine Other (See Comments)    h/a  . Erythromycin Nausea And Vomiting  . Erythromycin Nausea And Vomiting   Review of Systems Objective:  There were no vitals filed for this visit.  General: Well developed, nourished, in no acute distress, alert and oriented x3   Dermatological: Skin is warm, dry and supple bilateral. Nails x 10 are well maintained; remaining integument appears unremarkable at this time. There are no open sores, no preulcerative lesions, no rash or signs of infection present.  Vascular: Dorsalis Pedis artery and  Posterior Tibial artery pedal pulses are 2/4 bilateral with immedate capillary fill time. Pedal hair growth present. No varicosities and no lower extremity edema present bilateral.   Neruologic: Grossly intact via light touch bilateral. Vibratory intact via tuning fork bilateral. Protective threshold with Semmes Wienstein monofilament intact to all pedal sites bilateral. Patellar and Achilles deep tendon reflexes 2+ bilateral. No Babinski or clonus noted bilateral.   Musculoskeletal: No gross boney pedal deformities bilateral. No pain, crepitus, or limitation noted with foot and ankle range of motion bilateral. Muscular strength 5/5 in all groups tested bilateral. She has no pain on palpation with minimal edema dorsal aspect of the right foot overlying the base of the fourth metatarsal.  Gait: Unassisted, Nonantalgic.    Radiographs:  Radiographs taken today demonstrate an osseously mature individual with osteoarthritic changes of the head of the first metatarsal metatarsophalangeal joint. She also has a transverse fracture at the base of the fourth metatarsal of the right foot with callus formation.  Assessment & Plan:   Assessment: Fracture nondisplaced noncomminuted fourth metatarsal base right foot. Osteoarthritis first metatarsophalangeal joint right foot.  Plan: Clinically 100% improved no pain on palpation to the site.  Patient can transition from cam boot into regular sneakers.  If she is unable to do so we can discuss bone stimulator versus surgical option at that time.  For now I discussed with her to gradually begin the transition process.  She states understanding

## 2020-11-07 DIAGNOSIS — M8589 Other specified disorders of bone density and structure, multiple sites: Secondary | ICD-10-CM | POA: Diagnosis not present

## 2020-11-07 DIAGNOSIS — Z780 Asymptomatic menopausal state: Secondary | ICD-10-CM | POA: Diagnosis not present

## 2020-11-19 DIAGNOSIS — E559 Vitamin D deficiency, unspecified: Secondary | ICD-10-CM | POA: Diagnosis not present

## 2020-11-19 DIAGNOSIS — S92901A Unspecified fracture of right foot, initial encounter for closed fracture: Secondary | ICD-10-CM | POA: Diagnosis not present

## 2020-11-19 DIAGNOSIS — M810 Age-related osteoporosis without current pathological fracture: Secondary | ICD-10-CM | POA: Diagnosis not present

## 2020-11-19 DIAGNOSIS — R5383 Other fatigue: Secondary | ICD-10-CM | POA: Diagnosis not present

## 2020-11-20 DIAGNOSIS — F331 Major depressive disorder, recurrent, moderate: Secondary | ICD-10-CM | POA: Diagnosis not present

## 2020-11-20 DIAGNOSIS — F411 Generalized anxiety disorder: Secondary | ICD-10-CM | POA: Diagnosis not present

## 2020-11-21 DIAGNOSIS — F411 Generalized anxiety disorder: Secondary | ICD-10-CM | POA: Diagnosis not present

## 2020-11-21 DIAGNOSIS — F331 Major depressive disorder, recurrent, moderate: Secondary | ICD-10-CM | POA: Diagnosis not present

## 2020-11-21 DIAGNOSIS — F4310 Post-traumatic stress disorder, unspecified: Secondary | ICD-10-CM | POA: Diagnosis not present

## 2020-12-02 DIAGNOSIS — F607 Dependent personality disorder: Secondary | ICD-10-CM | POA: Diagnosis not present

## 2020-12-02 DIAGNOSIS — Z01818 Encounter for other preprocedural examination: Secondary | ICD-10-CM | POA: Diagnosis not present

## 2020-12-02 DIAGNOSIS — F4310 Post-traumatic stress disorder, unspecified: Secondary | ICD-10-CM | POA: Diagnosis not present

## 2020-12-02 DIAGNOSIS — F322 Major depressive disorder, single episode, severe without psychotic features: Secondary | ICD-10-CM | POA: Diagnosis not present

## 2020-12-02 DIAGNOSIS — F333 Major depressive disorder, recurrent, severe with psychotic symptoms: Secondary | ICD-10-CM | POA: Diagnosis not present

## 2020-12-02 DIAGNOSIS — F411 Generalized anxiety disorder: Secondary | ICD-10-CM | POA: Diagnosis not present

## 2020-12-03 ENCOUNTER — Other Ambulatory Visit (HOSPITAL_COMMUNITY): Payer: Self-pay | Admitting: Sports Medicine

## 2021-01-13 DIAGNOSIS — F329 Major depressive disorder, single episode, unspecified: Secondary | ICD-10-CM | POA: Diagnosis not present

## 2021-01-13 DIAGNOSIS — E039 Hypothyroidism, unspecified: Secondary | ICD-10-CM | POA: Diagnosis not present

## 2021-01-13 DIAGNOSIS — M810 Age-related osteoporosis without current pathological fracture: Secondary | ICD-10-CM | POA: Diagnosis not present

## 2021-01-13 DIAGNOSIS — Z79899 Other long term (current) drug therapy: Secondary | ICD-10-CM | POA: Diagnosis not present

## 2021-01-13 DIAGNOSIS — Z79890 Hormone replacement therapy (postmenopausal): Secondary | ICD-10-CM | POA: Diagnosis not present

## 2021-01-13 DIAGNOSIS — F322 Major depressive disorder, single episode, severe without psychotic features: Secondary | ICD-10-CM | POA: Diagnosis not present

## 2021-01-13 DIAGNOSIS — M1990 Unspecified osteoarthritis, unspecified site: Secondary | ICD-10-CM | POA: Diagnosis not present

## 2021-01-23 ENCOUNTER — Encounter: Payer: Self-pay | Admitting: Family Medicine

## 2021-01-23 ENCOUNTER — Ambulatory Visit: Payer: BC Managed Care – PPO | Admitting: Family Medicine

## 2021-01-23 ENCOUNTER — Other Ambulatory Visit: Payer: Self-pay

## 2021-01-23 VITALS — BP 120/80 | Ht 64.0 in | Wt 160.0 lb

## 2021-01-23 DIAGNOSIS — M8580 Other specified disorders of bone density and structure, unspecified site: Secondary | ICD-10-CM | POA: Diagnosis not present

## 2021-01-23 NOTE — Progress Notes (Signed)
Carla [name] - 60 y.o. female MRN [id number]  Date of birth: [birth year]  SUBJECTIVE:  Including CC & ROS.  No chief complaint on file.   Carla [name] is a 60 y.o. female that is she is presenting with multiple fractures despite antiresorptive medications.  She has had a stress fracture that occurred back in December.  She has had ongoing treatment since that time.  She has been on Prolia since 2015.  She also takes vitamin D.  Her recent lab work showed a normal calcium and normal vitamin D..  Review of the recent bone density shows osteopenia.   Review of Systems See HPI   HISTORY: Past Medical, Surgical, Social, and Family History Reviewed & Updated per EMR.   Pertinent Historical Findings include:  Past Medical History:  Diagnosis Date  . Allergy   . Anxiety   . Arthritis   . Depression   . Endometriosis   . Episode of heavy vaginal bleeding 03/31/2015  . Gait abnormality 05/11/2019  . Hypothyroidism   . Osteoporosis   . Thyroid disease    hypothyroidism  . Thyroid disease     Past Surgical History:  Procedure Laterality Date  . BACK SURGERY    . COLONOSCOPY  04/28/11   Normal  . cyst left 2nd finger    . DILATION AND CURETTAGE OF UTERUS    . DILITATION & CURRETTAGE/HYSTROSCOPY WITH NOVASURE ABLATION N/A 03/14/2015   Procedure: DILATATION & CURETTAGE/HYSTEROSCOPY WITH POSSIBLE NOVASURE ABLATION;  Surgeon: Olivia Mackie, MD;  Location: [facility];  Service: Gynecology;  Laterality: N/A;  . LAPAROSCOPIC APPENDECTOMY N/A 01/02/2019   Procedure: APPENDECTOMY LAPAROSCOPIC;  Surgeon: Ovidio Kin, MD;  Location: [facility];  Service: General;  Laterality: N/A;  . LAPAROSCOPY ABDOMEN DIAGNOSTIC    . left shoulder arthroscopy    . LUMBAR DISC SURGERY    . SHOULDER SURGERY      Family History  Problem Relation Age of Onset  . Alcohol abuse Father   . Bipolar disorder Father     Social History   Socioeconomic History  . Marital status: Divorced    Spouse name: Not  on file  . Number of children: Not on file  . Years of education: Not on file  . Highest education level: Not on file  Occupational History  . Not on file  Tobacco Use  . Smoking status: Never Smoker  . Smokeless tobacco: Never Used  Vaping Use  . Vaping Use: Never used  Substance and Sexual Activity  . Alcohol use: Never  . Drug use: Never  . Sexual activity: Not Currently  Other Topics Concern  . Not on file  Social History Narrative   ** Merged History Encounter **       Social Determinants of Health   Financial Resource Strain: Not on file  Food Insecurity: Not on file  Transportation Needs: Not on file  Physical Activity: Not on file  Stress: Not on file  Social Connections: Not on file  Intimate Partner Violence: Not on file     PHYSICAL EXAM:  VS: BP 120/80 (BP Location: Right Arm, Patient Position: Sitting, Cuff Size: Normal)   Ht 5\' 4"  (1.626 m)   Wt 160 lb (72.6 kg)   LMP 04/22/2011   BMI 27.46 kg/m  Physical Exam Gen: NAD, alert, cooperative with exam, well-appearing    ASSESSMENT & PLAN:   Osteopenia with high risk of fracture Has been on Prolia since 2015 has had recurrent fractures.  Most recent  fracture was in December.  She had a right foot metatarsal stress fracture in December. -Counseled on home exercise therapy and supportive care. -Pursue Evenity due to failure of antiresorptive medications.

## 2021-01-23 NOTE — Assessment & Plan Note (Signed)
Has been on Prolia since 2015 has had recurrent fractures.  Most recent fracture was in December.  She had a right foot metatarsal stress fracture in December. -Counseled on home exercise therapy and supportive care. -Pursue Evenity due to failure of antiresorptive medications.

## 2021-01-23 NOTE — Patient Instructions (Signed)
Nice to meet you We will contact you about the approval and scheduling.  Please send me a message in MyChart with any questions or updates. .   --Dr. Jordan Likes

## 2021-02-05 DIAGNOSIS — E039 Hypothyroidism, unspecified: Secondary | ICD-10-CM | POA: Diagnosis not present

## 2021-02-05 DIAGNOSIS — R635 Abnormal weight gain: Secondary | ICD-10-CM | POA: Diagnosis not present

## 2021-02-06 ENCOUNTER — Other Ambulatory Visit: Payer: Self-pay

## 2021-02-06 ENCOUNTER — Ambulatory Visit (INDEPENDENT_AMBULATORY_CARE_PROVIDER_SITE_OTHER): Payer: BC Managed Care – PPO

## 2021-02-06 ENCOUNTER — Ambulatory Visit: Payer: BC Managed Care – PPO | Admitting: Podiatry

## 2021-02-06 DIAGNOSIS — S92344A Nondisplaced fracture of fourth metatarsal bone, right foot, initial encounter for closed fracture: Secondary | ICD-10-CM

## 2021-02-06 DIAGNOSIS — Z01818 Encounter for other preprocedural examination: Secondary | ICD-10-CM | POA: Diagnosis not present

## 2021-02-06 MED ORDER — ACETAMINOPHEN-CODEINE #3 300-30 MG PO TABS: 1.0000 | ORAL_TABLET | ORAL | 0 refills | Status: DC | PRN

## 2021-02-07 ENCOUNTER — Encounter: Payer: Self-pay | Admitting: Podiatry

## 2021-02-07 NOTE — Progress Notes (Signed)
Subjective:  Patient ID: Carla [name], female    DOB: 1961/04/17,  MRN: [id number] HPI Chief Complaint  Patient presents with  . Foot Pain    Foot pain across the top of the foot     60 y.o. female presents with the above complaint.  She states that her pain has gotten worse since she was started returning into regular shoes.  She states she was doing great in the boot but she is unable to transition.  At this time I will asked her to place her self back in the shoe.  She would like to discuss surgical options at this time.  ROS: Denies fever chills nausea vomiting muscle aches pains calf pain back pain chest pain shortness of breath.  Past Medical History:  Diagnosis Date  . Allergy   . Anxiety   . Arthritis   . Depression   . Endometriosis   . Episode of heavy vaginal bleeding 03/31/2015  . Gait abnormality 05/11/2019  . Hypothyroidism   . Osteoporosis   . Thyroid disease    hypothyroidism  . Thyroid disease    Past Surgical History:  Procedure Laterality Date  . BACK SURGERY    . COLONOSCOPY  04/28/11   Normal  . cyst left 2nd finger    . DILATION AND CURETTAGE OF UTERUS    . DILITATION & CURRETTAGE/HYSTROSCOPY WITH NOVASURE ABLATION N/A 03/14/2015   Procedure: DILATATION & CURETTAGE/HYSTEROSCOPY WITH POSSIBLE NOVASURE ABLATION;  Surgeon: Olivia Mackie, MD;  Location: [facility];  Service: Gynecology;  Laterality: N/A;  . LAPAROSCOPIC APPENDECTOMY N/A 01/02/2019   Procedure: APPENDECTOMY LAPAROSCOPIC;  Surgeon: Ovidio Kin, MD;  Location: [facility];  Service: General;  Laterality: N/A;  . LAPAROSCOPY ABDOMEN DIAGNOSTIC    . left shoulder arthroscopy    . LUMBAR DISC SURGERY    . SHOULDER SURGERY      Current Outpatient Medications:  .  acetaminophen-codeine (TYLENOL #3) 300-30 MG tablet, Take 1-2 tablets by mouth every 4 (four) hours as needed for moderate pain., Disp: 30 tablet, Rfl: 0 .  acetaminophen (TYLENOL) 325 MG tablet, You can take 2 tablets every 4 hours as  needed for pain.  You can alternate this with ibuprofen.  You can buy this over-the-counter at any drugstore. DO NOT TAKE MORE THAN 4000 MG OF TYLENOL PER DAY.  IT CAN HARM YOUR LIVER.  TYLENOL (ACETAMINOPHEN) IS ALSO IN YOUR PRESCRIPTION PAIN MEDICATION.  YOU HAVE TO COUNT IT IN YOUR DAILY TOTAL., Disp: , Rfl:  .  celecoxib (CELEBREX) 200 MG capsule, Take 200 mg by mouth daily., Disp: , Rfl:  .  cyclobenzaprine (FLEXERIL) 10 MG tablet, Take 10 mg by mouth at bedtime., Disp: , Rfl: 3 .  desvenlafaxine (PRISTIQ) 50 MG 24 hr tablet, Take 50 mg by mouth daily., Disp: , Rfl: 1 .  docusate sodium (COLACE) 100 MG capsule, Take 100 mg by mouth 2 (two) times daily. , Disp: , Rfl:  .  EVENITY 105 MG/1. SOSY injection, INJECT 2 SYRINGE SUBCUTANEOUSLY ONCE A MONTH, Disp: 2.34 mL, Rfl: 12 .  gabapentin (NEURONTIN) 600 MG tablet, , Disp: , Rfl:  .  Inositol 500 MG TABS, Take 500 mg by mouth daily., Disp: , Rfl:  .  L-Methylfolate 15 MG TABS, Take 15 mg by mouth daily. , Disp: , Rfl:  .  levothyroxine (SYNTHROID) 137 MCG tablet, Take 137 mcg by mouth daily before breakfast., Disp: , Rfl:  .  Melatonin 5 MG CAPS, Take 1 capsule by mouth at  bedtime., Disp: , Rfl:  .  mirtazapine (REMERON) 7.5 MG tablet, Take 7.5 mg by mouth at bedtime., Disp: , Rfl:  .  OLANZapine (ZYPREXA) 10 MG tablet, Take 10 mg by mouth at bedtime., Disp: , Rfl:  .  PENNSAID 2 % SOLN, SMARTSIG:2 Pump Topical Twice Daily, Disp: , Rfl:  .  ramelteon (ROZEREM) 8 MG tablet, Take 1 tablet (8 mg total) by mouth at bedtime., Disp: 30 tablet, Rfl: 0 .  traZODone (DESYREL) 100 MG tablet, Take 150 mg by mouth at bedtime., Disp: , Rfl: 1 .  valACYclovir (VALTREX) 1000 MG tablet, Take 1 g by mouth daily., Disp: , Rfl: 1  Allergies  Allergen Reactions  . Clarithromycin Nausea And Vomiting  . Codeine Other (See Comments)    headache  . Codeine Other (See Comments)    h/a  . Erythromycin Nausea And Vomiting  . Erythromycin Nausea And Vomiting    Review of Systems Objective:  There were no vitals filed for this visit.  General: Well developed, nourished, in no acute distress, alert and oriented x3   Dermatological: Skin is warm, dry and supple bilateral. Nails x 10 are well maintained; remaining integument appears unremarkable at this time. There are no open sores, no preulcerative lesions, no rash or signs of infection present.  Vascular: Dorsalis Pedis artery and Posterior Tibial artery pedal pulses are 2/4 bilateral with immedate capillary fill time. Pedal hair growth present. No varicosities and no lower extremity edema present bilateral.   Neruologic: Grossly intact via light touch bilateral. Vibratory intact via tuning fork bilateral. Protective threshold with Semmes Wienstein monofilament intact to all pedal sites bilateral. Patellar and Achilles deep tendon reflexes 2+ bilateral. No Babinski or clonus noted bilateral.   Musculoskeletal: No gross boney pedal deformities bilateral. No pain, crepitus, or limitation noted with foot and ankle range of motion bilateral. Muscular strength 5/5 in all groups tested bilateral.  Pain on palpation with small edema to the dorsal aspect of fourth digit.  Pain no pain with range of motion of the metatarsophalangeal joint.  No pain at the tarsometatarsal joint.  Gait: Unassisted, Nonantalgic.    Radiographs:  Radiographs taken today demonstrate an osseously mature individual with osteoarthritic changes of the head of the first metatarsal metatarsophalangeal joint. She also has a transverse fracture at the base of the fourth metatarsal of the right foot with callus formation.  Assessment & Plan:   Assessment: Fracture nondisplaced noncomminuted fourth metatarsal base right foot. Osteoarthritis first metatarsophalangeal joint right foot.  Plan: Clinically patient was unable to transition into regular shoes and the pain tends to be continuous.  At this time given the amount of pain that she  is having without any resolve meant with current radiograph that were taken.  I believe patient will benefit from surgical intervention.  Ultimately she will benefit from open reduction internal fixation of right fourth digit with plates and screws.  I discussed my surgical plan my preoperative plan as well as my postoperative plan with the patient in extensive detail.  She states understanding would like to proceed with it.  Should be nonweightbearing in a cam boot for 4 to 6 weeks followed by a slowly progressing to weightbearing into regular shoes.  She states understanding -I have asked her to bring her cam boot with her during the surgery center.

## 2021-02-10 ENCOUNTER — Telehealth: Payer: Self-pay | Admitting: *Deleted

## 2021-02-10 NOTE — Telephone Encounter (Signed)
Received fax from [facility] stating Carla [name] is denied. Please advise.

## 2021-02-11 ENCOUNTER — Ambulatory Visit: Payer: BC Managed Care – PPO | Admitting: Podiatry

## 2021-02-11 ENCOUNTER — Telehealth: Payer: Self-pay | Admitting: Podiatry

## 2021-02-11 ENCOUNTER — Other Ambulatory Visit: Payer: Self-pay

## 2021-02-11 ENCOUNTER — Ambulatory Visit (INDEPENDENT_AMBULATORY_CARE_PROVIDER_SITE_OTHER): Payer: BC Managed Care – PPO

## 2021-02-11 ENCOUNTER — Encounter: Payer: Self-pay | Admitting: Podiatry

## 2021-02-11 DIAGNOSIS — S92344A Nondisplaced fracture of fourth metatarsal bone, right foot, initial encounter for closed fracture: Secondary | ICD-10-CM | POA: Diagnosis not present

## 2021-02-11 DIAGNOSIS — S92344K Nondisplaced fracture of fourth metatarsal bone, right foot, subsequent encounter for fracture with nonunion: Secondary | ICD-10-CM | POA: Diagnosis not present

## 2021-02-11 DIAGNOSIS — M84374S Stress fracture, right foot, sequela: Secondary | ICD-10-CM

## 2021-02-11 MED ORDER — TRAMADOL HCL 50 MG PO TABS: 50.0000 mg | ORAL_TABLET | Freq: Three times a day (TID) | ORAL | 0 refills | Status: AC | PRN

## 2021-02-11 NOTE — Telephone Encounter (Signed)
Patient was notified of cast cover per Dr Eliane Decree recommendation

## 2021-02-11 NOTE — Progress Notes (Addendum)
Subjective:  Patient ID: Carla [name], female    DOB: 1961-03-22,  MRN: [id number] Foot Pain   Chief Complaint  Patient presents with  . Fracture    "Im having more pain and swelling in the top of my foot"   She request another boot today    60 y.o. female presents with the above complaint.  Patient presents with new complaint of right dorsal second metatarsal and third metatarsal pain.  She states she is still has pain on the fourth metatarsal which which were primarily scheduled for surgery.  However she started to have any another pain on the second and third metatarsal.  She wanted get evaluated make sure that there is no stress fracture.  She denies any other acute complaints.  ROS: Denies fever chills nausea vomiting muscle aches pains calf pain back pain chest pain shortness of breath.  Past Medical History:  Diagnosis Date  . Allergy   . Anxiety   . Arthritis   . Depression   . Endometriosis   . Episode of heavy vaginal bleeding 03/31/2015  . Gait abnormality 05/11/2019  . Hypothyroidism   . Osteoporosis   . Thyroid disease    hypothyroidism  . Thyroid disease    Past Surgical History:  Procedure Laterality Date  . BACK SURGERY    . COLONOSCOPY  04/28/11   Normal  . cyst left 2nd finger    . DILATION AND CURETTAGE OF UTERUS    . DILITATION & CURRETTAGE/HYSTROSCOPY WITH NOVASURE ABLATION N/A 03/14/2015   Procedure: DILATATION & CURETTAGE/HYSTEROSCOPY WITH POSSIBLE NOVASURE ABLATION;  Surgeon: Olivia Mackie, MD;  Location: [facility];  Service: Gynecology;  Laterality: N/A;  . LAPAROSCOPIC APPENDECTOMY N/A 01/02/2019   Procedure: APPENDECTOMY LAPAROSCOPIC;  Surgeon: Ovidio Kin, MD;  Location: [facility];  Service: General;  Laterality: N/A;  . LAPAROSCOPY ABDOMEN DIAGNOSTIC    . left shoulder arthroscopy    . LUMBAR DISC SURGERY    . SHOULDER SURGERY      Current Outpatient Medications:  .  traMADol (ULTRAM) 50 MG tablet, Take 1 tablet (50 mg total) by mouth every 8  (eight) hours as needed for up to 5 days., Disp: 15 tablet, Rfl: 0 .  acetaminophen (TYLENOL) 325 MG tablet, You can take 2 tablets every 4 hours as needed for pain.  You can alternate this with ibuprofen.  You can buy this over-the-counter at any drugstore. DO NOT TAKE MORE THAN 4000 MG OF TYLENOL PER DAY.  IT CAN HARM YOUR LIVER.  TYLENOL (ACETAMINOPHEN) IS ALSO IN YOUR PRESCRIPTION PAIN MEDICATION.  YOU HAVE TO COUNT IT IN YOUR DAILY TOTAL., Disp: , Rfl:  .  acetaminophen-codeine (TYLENOL #3) 300-30 MG tablet, Take 1-2 tablets by mouth every 4 (four) hours as needed for moderate pain., Disp: 30 tablet, Rfl: 0 .  celecoxib (CELEBREX) 200 MG capsule, Take 200 mg by mouth daily., Disp: , Rfl:  .  cyclobenzaprine (FLEXERIL) 10 MG tablet, Take 10 mg by mouth at bedtime., Disp: , Rfl: 3 .  desvenlafaxine (PRISTIQ) 50 MG 24 hr tablet, Take 50 mg by mouth daily., Disp: , Rfl: 1 .  docusate sodium (COLACE) 100 MG capsule, Take 100 mg by mouth 2 (two) times daily. , Disp: , Rfl:  .  EVENITY 105 MG/1. SOSY injection, INJECT 2 SYRINGE SUBCUTANEOUSLY ONCE A MONTH, Disp: 2.34 mL, Rfl: 12 .  gabapentin (NEURONTIN) 600 MG tablet, , Disp: , Rfl:  .  Inositol 500 MG TABS, Take 500 mg by mouth daily.,  Disp: , Rfl:  .  L-Methylfolate 15 MG TABS, Take 15 mg by mouth daily. , Disp: , Rfl:  .  levothyroxine (SYNTHROID) 137 MCG tablet, Take 137 mcg by mouth daily before breakfast., Disp: , Rfl:  .  Melatonin 5 MG CAPS, Take 1 capsule by mouth at bedtime., Disp: , Rfl:  .  mirtazapine (REMERON) 7.5 MG tablet, Take 7.5 mg by mouth at bedtime., Disp: , Rfl:  .  OLANZapine (ZYPREXA) 10 MG tablet, Take 10 mg by mouth at bedtime., Disp: , Rfl:  .  PENNSAID 2 % SOLN, SMARTSIG:2 Pump Topical Twice Daily, Disp: , Rfl:  .  ramelteon (ROZEREM) 8 MG tablet, Take 1 tablet (8 mg total) by mouth at bedtime., Disp: 30 tablet, Rfl: 0 .  traZODone (DESYREL) 100 MG tablet, Take 150 mg by mouth at bedtime., Disp: , Rfl: 1 .   valACYclovir (VALTREX) 1000 MG tablet, Take 1 g by mouth daily., Disp: , Rfl: 1  Allergies  Allergen Reactions  . Clarithromycin Nausea And Vomiting  . Codeine Other (See Comments)    headache  . Codeine Other (See Comments)    h/a  . Erythromycin Nausea And Vomiting  . Erythromycin Nausea And Vomiting   Review of Systems Objective:  There were no vitals filed for this visit.  General: Well developed, nourished, in no acute distress, alert and oriented x3   Dermatological: Skin is warm, dry and supple bilateral. Nails x 10 are well maintained; remaining integument appears unremarkable at this time. There are no open sores, no preulcerative lesions, no rash or signs of infection present.  Vascular: Dorsalis Pedis artery and Posterior Tibial artery pedal pulses are 2/4 bilateral with immedate capillary fill time. Pedal hair growth present. No varicosities and no lower extremity edema present bilateral.   Neruologic: Grossly intact via light touch bilateral. Vibratory intact via tuning fork bilateral. Protective threshold with Semmes Wienstein monofilament intact to all pedal sites bilateral. Patellar and Achilles deep tendon reflexes 2+ bilateral. No Babinski or clonus noted bilateral.   Musculoskeletal: No gross boney pedal deformities bilateral. No pain, crepitus, or limitation noted with foot and ankle range of motion bilateral. Muscular strength 5/5 in all groups tested bilateral.  Pain on palpation with small edema to the dorsal aspect of fourth digit.  Pain no pain with range of motion of the metatarsophalangeal joint.  No pain at the tarsometatarsal joint.  Pain along the course of second and third metatarsal.  No pain at the range of motion of metatarsophalangeal joint of second and third.  Gait: Unassisted, Nonantalgic.    Radiographs:  Radiographs taken today demonstrate an osseously mature individual with osteoarthritic changes of the head of the first metatarsal  metatarsophalangeal joint. She also has a transverse fracture at the base of the fourth metatarsal of the right foot with hypertrophic nonunion formation..  She also has a stress fracture now of second and third metatarsal.  Nondisplaced nonangulated fracture.  Assessment & Plan:   Assessment: Fracture nondisplaced noncomminuted fourth metatarsal base right foot. Osteoarthritis first metatarsophalangeal joint right foot. 2.  Stress fracture of second and third metatarsal midshaft  Plan:  Nonhealing fourth metatarsal fracture  Clinically patient was unable to transition into regular shoes and the pain tends to be continuous.  At this time given the amount of pain that she is having without any resolve meant with current radiograph that were taken.  I believe patient will benefit from surgical intervention.  Ultimately she will benefit from open reduction  internal fixation of right fourth digit with plates and screws.  I discussed my surgical plan my preoperative plan as well as my postoperative plan with the patient in extensive detail.  She states understanding would like to proceed with it.  Should be nonweightbearing in a cam boot for 4 to 6 weeks followed by a slowly progressing to weightbearing into regular shoes.  She states understanding -I have asked her to bring her cam boot with her during the surgery center. -She will benefit from bone stimulator  Stress fracture of second and third metatarsal -She will complete immobilizer foot and be nonweightbearing to the right lower extremity with a knee scooter.  At this time it is not displaced and not radiographically misaligned or angulated I believe patient may benefit from conservative treatment options for now. -She will also benefit from bone stimulator as well.

## 2021-02-11 NOTE — Telephone Encounter (Signed)
Pt left message stating she was seen today and was given a tall boot and needs something to cover it to take a shower. She is scheduled for surgery on 5.16. Please call pt and advise.

## 2021-02-11 NOTE — Telephone Encounter (Signed)
She can do cast cover from [facility] or [facility].

## 2021-02-12 ENCOUNTER — Telehealth: Payer: Self-pay | Admitting: Podiatry

## 2021-02-12 NOTE — Telephone Encounter (Signed)
That is fine thank you .  

## 2021-02-12 NOTE — Telephone Encounter (Signed)
Patient is requesting forms for a handicap decal during the time she is recovering from broken bones (DOS 5/16). Patient is willing to come in to pick up the forms. Please advise

## 2021-02-14 ENCOUNTER — Telehealth: Payer: Self-pay | Admitting: Urology

## 2021-02-14 NOTE — Telephone Encounter (Signed)
DOS - 03/03/21  ORIF 4TH METATARSAL - RIGHT --- 94585   [employer] EFFECTIVE DATE - 10/19/20   PLAN DEDUCTIBLE - $2,500.00 W/ $0.00 REMAINING OUT OF POCKET - $8,700.00 W/ $4,601.70 REMAINING COINSURANCE - 30%  COPAY - $0.00   NO PRIOR AUTH REQUIRED

## 2021-02-17 DIAGNOSIS — S92344K Nondisplaced fracture of fourth metatarsal bone, right foot, subsequent encounter for fracture with nonunion: Secondary | ICD-10-CM | POA: Diagnosis not present

## 2021-02-18 ENCOUNTER — Telehealth: Payer: Self-pay | Admitting: *Deleted

## 2021-02-18 ENCOUNTER — Telehealth: Payer: Self-pay | Admitting: Podiatry

## 2021-02-18 NOTE — Telephone Encounter (Signed)
Patient called regarding bone stimulator. States she currently doesn't have pain and would like to know how to use. There was supposed to be marked area and she is unsure where the actual fracture is located, Please Advise

## 2021-02-18 NOTE — Telephone Encounter (Signed)
She can start using it right away.

## 2021-02-18 NOTE — Telephone Encounter (Signed)
Returned call to patient and gave information per Dr Allena Katz that she may start using the stimulator right away,verbalized understanding.

## 2021-02-18 NOTE — Telephone Encounter (Signed)
Patient is calling and said that her bone stimulator just arrived today. When should she start using,now or wait until after her surgery on 03/03/21?Please advise.

## 2021-02-19 DIAGNOSIS — F411 Generalized anxiety disorder: Secondary | ICD-10-CM | POA: Diagnosis not present

## 2021-02-19 DIAGNOSIS — F4310 Post-traumatic stress disorder, unspecified: Secondary | ICD-10-CM | POA: Diagnosis not present

## 2021-02-19 DIAGNOSIS — F331 Major depressive disorder, recurrent, moderate: Secondary | ICD-10-CM | POA: Diagnosis not present

## 2021-02-24 DIAGNOSIS — Z79899 Other long term (current) drug therapy: Secondary | ICD-10-CM | POA: Diagnosis not present

## 2021-02-24 DIAGNOSIS — F5104 Psychophysiologic insomnia: Secondary | ICD-10-CM | POA: Diagnosis not present

## 2021-02-24 DIAGNOSIS — E039 Hypothyroidism, unspecified: Secondary | ICD-10-CM | POA: Diagnosis not present

## 2021-02-24 DIAGNOSIS — Z818 Family history of other mental and behavioral disorders: Secondary | ICD-10-CM | POA: Diagnosis not present

## 2021-02-24 DIAGNOSIS — G8929 Other chronic pain: Secondary | ICD-10-CM | POA: Diagnosis not present

## 2021-02-24 DIAGNOSIS — F322 Major depressive disorder, single episode, severe without psychotic features: Secondary | ICD-10-CM | POA: Diagnosis not present

## 2021-02-24 DIAGNOSIS — Z9889 Other specified postprocedural states: Secondary | ICD-10-CM | POA: Diagnosis not present

## 2021-02-24 DIAGNOSIS — F4310 Post-traumatic stress disorder, unspecified: Secondary | ICD-10-CM | POA: Diagnosis not present

## 2021-02-24 DIAGNOSIS — Z9049 Acquired absence of other specified parts of digestive tract: Secondary | ICD-10-CM | POA: Diagnosis not present

## 2021-02-24 DIAGNOSIS — F411 Generalized anxiety disorder: Secondary | ICD-10-CM | POA: Diagnosis not present

## 2021-02-24 DIAGNOSIS — M1990 Unspecified osteoarthritis, unspecified site: Secondary | ICD-10-CM | POA: Diagnosis not present

## 2021-02-26 ENCOUNTER — Other Ambulatory Visit: Payer: Self-pay | Admitting: Podiatry

## 2021-02-26 ENCOUNTER — Telehealth: Payer: Self-pay | Admitting: Podiatry

## 2021-02-26 MED ORDER — TRAMADOL HCL 50 MG PO TABS: 50.0000 mg | ORAL_TABLET | Freq: Three times a day (TID) | ORAL | 0 refills | Status: AC | PRN

## 2021-02-26 NOTE — Telephone Encounter (Signed)
Please advise 

## 2021-02-26 NOTE — Addendum Note (Signed)
Addended by: Nicholes Rough on: 02/26/2021 11:36 AM   Modules accepted: Orders

## 2021-02-26 NOTE — Telephone Encounter (Signed)
Pt called and stated she is going to run out of her tramadol tonight and wanted to know if Dr. Allena Katz could prescribe a few more until she gets her sx on monday.

## 2021-03-03 ENCOUNTER — Other Ambulatory Visit: Payer: Self-pay | Admitting: Podiatry

## 2021-03-03 ENCOUNTER — Encounter: Payer: Self-pay | Admitting: Podiatry

## 2021-03-03 DIAGNOSIS — M25571 Pain in right ankle and joints of right foot: Secondary | ICD-10-CM | POA: Diagnosis not present

## 2021-03-03 DIAGNOSIS — M84374K Stress fracture, right foot, subsequent encounter for fracture with nonunion: Secondary | ICD-10-CM | POA: Diagnosis not present

## 2021-03-03 DIAGNOSIS — S92344A Nondisplaced fracture of fourth metatarsal bone, right foot, initial encounter for closed fracture: Secondary | ICD-10-CM | POA: Diagnosis not present

## 2021-03-03 MED ORDER — IBUPROFEN 800 MG PO TABS: 800.0000 mg | ORAL_TABLET | Freq: Four times a day (QID) | ORAL | 1 refills | Status: AC | PRN

## 2021-03-03 MED ORDER — OXYCODONE-ACETAMINOPHEN 5-325 MG PO TABS: 1.0000 | ORAL_TABLET | ORAL | 0 refills | Status: DC | PRN

## 2021-03-04 ENCOUNTER — Telehealth: Payer: Self-pay | Admitting: Podiatry

## 2021-03-04 NOTE — Telephone Encounter (Signed)
Let her know to hold of on using it for next three weeks

## 2021-03-04 NOTE — Telephone Encounter (Signed)
Pt is calling about bone stimulator. She stated that she tried to use it without the gel and an error message came up stating to add gel.  Please call patient to go over instructions

## 2021-03-05 ENCOUNTER — Telehealth: Payer: Self-pay | Admitting: Podiatry

## 2021-03-05 NOTE — Telephone Encounter (Signed)
She can just reinforced.  It is completely normal for the bleeding to come through after surgery.

## 2021-03-05 NOTE — Telephone Encounter (Signed)
Pt had sx with Dr. Allena Katz on 03/03/2021 and she stated she took her boot off and dressing has some bleeding coming through. She would like to know what to do. Please advise

## 2021-03-06 NOTE — Telephone Encounter (Signed)
Lvm for patient.

## 2021-03-06 NOTE — Telephone Encounter (Signed)
Informed patient

## 2021-03-12 ENCOUNTER — Encounter: Payer: BC Managed Care – PPO | Admitting: Podiatry

## 2021-03-13 ENCOUNTER — Ambulatory Visit (INDEPENDENT_AMBULATORY_CARE_PROVIDER_SITE_OTHER): Payer: BC Managed Care – PPO | Admitting: Podiatry

## 2021-03-13 ENCOUNTER — Ambulatory Visit (INDEPENDENT_AMBULATORY_CARE_PROVIDER_SITE_OTHER): Payer: BC Managed Care – PPO

## 2021-03-13 ENCOUNTER — Other Ambulatory Visit: Payer: Self-pay

## 2021-03-13 DIAGNOSIS — M84374S Stress fracture, right foot, sequela: Secondary | ICD-10-CM

## 2021-03-13 DIAGNOSIS — S92344K Nondisplaced fracture of fourth metatarsal bone, right foot, subsequent encounter for fracture with nonunion: Secondary | ICD-10-CM

## 2021-03-13 DIAGNOSIS — M79671 Pain in right foot: Secondary | ICD-10-CM | POA: Diagnosis not present

## 2021-03-13 DIAGNOSIS — Z9889 Other specified postprocedural states: Secondary | ICD-10-CM

## 2021-03-13 MED ORDER — OXYCODONE-ACETAMINOPHEN 5-325 MG PO TABS: 1.0000 | ORAL_TABLET | ORAL | 0 refills | Status: DC | PRN

## 2021-03-18 ENCOUNTER — Encounter: Payer: Self-pay | Admitting: Podiatry

## 2021-03-18 NOTE — Progress Notes (Signed)
Subjective:  Patient ID: Carla [name], female    DOB: 1961/07/14,  MRN: [id number]  Chief Complaint  Patient presents with  . Routine Post Op    5.16.22    DOS: 03/03/2021 Procedure: Right ORIF of nonhealing fracture metatarsal  60 y.o. female returns for post-op check.  Patient states she is doing well.  She still has occasional pain.  She has been nonweightbearing to the right lower extremity.  She has not been using the bone stimulator quite yet.  She will be starting soon.  She denies any other acute complaints.  Review of Systems: Negative except as noted in the HPI. Denies N/V/F/Ch.  Past Medical History:  Diagnosis Date  . Allergy   . Anxiety   . Arthritis   . Depression   . Endometriosis   . Episode of heavy vaginal bleeding 03/31/2015  . Gait abnormality 05/11/2019  . Hypothyroidism   . Osteoporosis   . Thyroid disease    hypothyroidism  . Thyroid disease     Current Outpatient Medications:  .  acetaminophen (TYLENOL) 325 MG tablet, You can take 2 tablets every 4 hours as needed for pain.  You can alternate this with ibuprofen.  You can buy this over-the-counter at any drugstore. DO NOT TAKE MORE THAN 4000 MG OF TYLENOL PER DAY.  IT CAN HARM YOUR LIVER.  TYLENOL (ACETAMINOPHEN) IS ALSO IN YOUR PRESCRIPTION PAIN MEDICATION.  YOU HAVE TO COUNT IT IN YOUR DAILY TOTAL., Disp: , Rfl:  .  acetaminophen-codeine (TYLENOL #3) 300-30 MG tablet, Take 1-2 tablets by mouth every 4 (four) hours as needed for moderate pain., Disp: 30 tablet, Rfl: 0 .  celecoxib (CELEBREX) 200 MG capsule, Take 200 mg by mouth daily., Disp: , Rfl:  .  cyclobenzaprine (FLEXERIL) 10 MG tablet, Take 10 mg by mouth at bedtime., Disp: , Rfl: 3 .  desvenlafaxine (PRISTIQ) 50 MG 24 hr tablet, Take 50 mg by mouth daily., Disp: , Rfl: 1 .  docusate sodium (COLACE) 100 MG capsule, Take 100 mg by mouth 2 (two) times daily. , Disp: , Rfl:  .  EVENITY 105 MG/1. SOSY injection, INJECT 2 SYRINGE SUBCUTANEOUSLY  ONCE A MONTH, Disp: 2.34 mL, Rfl: 12 .  gabapentin (NEURONTIN) 600 MG tablet, , Disp: , Rfl:  .  ibuprofen (ADVIL) 800 MG tablet, Take 1 tablet (800 mg total) by mouth every 6 (six) hours as needed., Disp: 60 tablet, Rfl: 1 .  Inositol 500 MG TABS, Take 500 mg by mouth daily., Disp: , Rfl:  .  L-Methylfolate 15 MG TABS, Take 15 mg by mouth daily. , Disp: , Rfl:  .  levothyroxine (SYNTHROID) 137 MCG tablet, Take 137 mcg by mouth daily before breakfast., Disp: , Rfl:  .  Melatonin 5 MG CAPS, Take 1 capsule by mouth at bedtime., Disp: , Rfl:  .  mirtazapine (REMERON) 7.5 MG tablet, Take 7.5 mg by mouth at bedtime., Disp: , Rfl:  .  OLANZapine (ZYPREXA) 10 MG tablet, Take 10 mg by mouth at bedtime., Disp: , Rfl:  .  oxyCODONE-acetaminophen (PERCOCET) 5-325 MG tablet, Take 1-2 tablets by mouth every 4 (four) hours as needed for severe pain., Disp: 30 tablet, Rfl: 0 .  PENNSAID 2 % SOLN, SMARTSIG:2 Pump Topical Twice Daily, Disp: , Rfl:  .  ramelteon (ROZEREM) 8 MG tablet, Take 1 tablet (8 mg total) by mouth at bedtime., Disp: 30 tablet, Rfl: 0 .  traZODone (DESYREL) 100 MG tablet, Take 150 mg by mouth at bedtime., Disp: ,  Rfl: 1 .  valACYclovir (VALTREX) 1000 MG tablet, Take 1 g by mouth daily., Disp: , Rfl: 1  Social History   Tobacco Use  Smoking Status Never Smoker  Smokeless Tobacco Never Used    Allergies  Allergen Reactions  . Clarithromycin Nausea And Vomiting  . Codeine Other (See Comments)    headache  . Codeine Other (See Comments)    h/a  . Erythromycin Nausea And Vomiting  . Erythromycin Nausea And Vomiting   Objective:  There were no vitals filed for this visit. There is no height or weight on file to calculate BMI. Constitutional Well developed. Well nourished.  Vascular Foot warm and well perfused. Capillary refill normal to all digits.   Neurologic Normal speech. Oriented to person, place, and time. Epicritic sensation to light touch grossly present bilaterally.   Dermatologic Skin healing well without signs of infection. Skin edges well coapted without signs of infection.  Orthopedic: Tenderness to palpation noted about the surgical site.  Mild pain on palpation to the right second and third metatarsal stress fracture.  Pain with range of motion of the MPJ joint of the right second and third.   Radiographs: 3 views of skeletally mature the right foot: Good correction alignment noted.  Hardware is intact.  No backing out or loosening of the hardware noted. Assessment:   1. 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  S/p foot surgery right -Progressing as expected post-operatively. -XR: See above -WB Status: Nonweightbearing in knee scooter right lower extremity -Sutures: Intact.  No clinical signs of dehiscence noted.  No complication noted. -Medications: None -Foot redressed.  Stress fracture of second and third metatarsal -She will complete immobilizer foot and be nonweightbearing to the right lower extremity with a knee scooter.  At this time it is not displaced and not radiographically misaligned or angulated I believe patient may benefit from conservative treatment options for now. -We will resume bone stimulator starting next clinical visit.  No follow-ups on file.

## 2021-03-20 DIAGNOSIS — E039 Hypothyroidism, unspecified: Secondary | ICD-10-CM | POA: Diagnosis not present

## 2021-03-26 ENCOUNTER — Ambulatory Visit (INDEPENDENT_AMBULATORY_CARE_PROVIDER_SITE_OTHER): Payer: BC Managed Care – PPO | Admitting: Podiatry

## 2021-03-26 ENCOUNTER — Ambulatory Visit (INDEPENDENT_AMBULATORY_CARE_PROVIDER_SITE_OTHER): Payer: BC Managed Care – PPO

## 2021-03-26 ENCOUNTER — Other Ambulatory Visit: Payer: Self-pay

## 2021-03-26 DIAGNOSIS — M84374S Stress fracture, right foot, sequela: Secondary | ICD-10-CM | POA: Diagnosis not present

## 2021-03-26 DIAGNOSIS — M79671 Pain in right foot: Secondary | ICD-10-CM

## 2021-03-26 DIAGNOSIS — Z9889 Other specified postprocedural states: Secondary | ICD-10-CM

## 2021-03-26 DIAGNOSIS — S92344K Nondisplaced fracture of fourth metatarsal bone, right foot, subsequent encounter for fracture with nonunion: Secondary | ICD-10-CM

## 2021-03-31 DIAGNOSIS — Z9049 Acquired absence of other specified parts of digestive tract: Secondary | ICD-10-CM | POA: Diagnosis not present

## 2021-03-31 DIAGNOSIS — N183 Chronic kidney disease, stage 3 unspecified: Secondary | ICD-10-CM | POA: Diagnosis not present

## 2021-03-31 DIAGNOSIS — Z818 Family history of other mental and behavioral disorders: Secondary | ICD-10-CM | POA: Diagnosis not present

## 2021-03-31 DIAGNOSIS — F322 Major depressive disorder, single episode, severe without psychotic features: Secondary | ICD-10-CM | POA: Diagnosis not present

## 2021-03-31 DIAGNOSIS — Z01818 Encounter for other preprocedural examination: Secondary | ICD-10-CM | POA: Diagnosis not present

## 2021-03-31 DIAGNOSIS — F411 Generalized anxiety disorder: Secondary | ICD-10-CM | POA: Diagnosis not present

## 2021-03-31 DIAGNOSIS — Z79899 Other long term (current) drug therapy: Secondary | ICD-10-CM | POA: Diagnosis not present

## 2021-03-31 DIAGNOSIS — F332 Major depressive disorder, recurrent severe without psychotic features: Secondary | ICD-10-CM | POA: Diagnosis not present

## 2021-03-31 DIAGNOSIS — G4700 Insomnia, unspecified: Secondary | ICD-10-CM | POA: Diagnosis not present

## 2021-03-31 DIAGNOSIS — M810 Age-related osteoporosis without current pathological fracture: Secondary | ICD-10-CM | POA: Diagnosis not present

## 2021-03-31 DIAGNOSIS — E039 Hypothyroidism, unspecified: Secondary | ICD-10-CM | POA: Diagnosis not present

## 2021-03-31 DIAGNOSIS — M1990 Unspecified osteoarthritis, unspecified site: Secondary | ICD-10-CM | POA: Diagnosis not present

## 2021-03-31 DIAGNOSIS — F4310 Post-traumatic stress disorder, unspecified: Secondary | ICD-10-CM | POA: Diagnosis not present

## 2021-04-01 ENCOUNTER — Encounter: Payer: Self-pay | Admitting: Podiatry

## 2021-04-01 NOTE — Progress Notes (Signed)
Subjective:  Patient ID: Carla [name], female    DOB: Aug 31, 1961,  MRN: [id number]  Chief Complaint  Patient presents with   Routine Post Op    POST OP DOS 5.16.22    DOS: 03/03/2021 Procedure: Right ORIF of nonhealing fracture metatarsal  60 y.o. female returns for post-op check.  Patient states she is doing well.  She still has occasional pain.  She has been nonweightbearing to the right lower extremity.  She has not been using the bone stimulator quite yet.  She will be starting soon.  She denies any other acute complaints.  Review of Systems: Negative except as noted in the HPI. Denies N/V/F/Ch.  Past Medical History:  Diagnosis Date   Allergy    Anxiety    Arthritis    Depression    Endometriosis    Episode of heavy vaginal bleeding 03/31/2015   Gait abnormality 05/11/2019   Hypothyroidism    Osteoporosis    Thyroid disease    hypothyroidism   Thyroid disease     Current Outpatient Medications:    acetaminophen (TYLENOL) 325 MG tablet, You can take 2 tablets every 4 hours as needed for pain.  You can alternate this with ibuprofen.  You can buy this over-the-counter at any drugstore. DO NOT TAKE MORE THAN 4000 MG OF TYLENOL PER DAY.  IT CAN HARM YOUR LIVER.  TYLENOL (ACETAMINOPHEN) IS ALSO IN YOUR PRESCRIPTION PAIN MEDICATION.  YOU HAVE TO COUNT IT IN YOUR DAILY TOTAL., Disp: , Rfl:    acetaminophen-codeine (TYLENOL #3) 300-30 MG tablet, Take 1-2 tablets by mouth every 4 (four) hours as needed for moderate pain., Disp: 30 tablet, Rfl: 0   celecoxib (CELEBREX) 200 MG capsule, Take 200 mg by mouth daily., Disp: , Rfl:    cyclobenzaprine (FLEXERIL) 10 MG tablet, Take 10 mg by mouth at bedtime., Disp: , Rfl: 3   desvenlafaxine (PRISTIQ) 50 MG 24 hr tablet, Take 50 mg by mouth daily., Disp: , Rfl: 1   docusate sodium (COLACE) 100 MG capsule, Take 100 mg by mouth 2 (two) times daily. , Disp: , Rfl:    EVENITY 105 MG/1. SOSY injection, INJECT 2 SYRINGE SUBCUTANEOUSLY ONCE A  MONTH, Disp: 2.34 mL, Rfl: 12   gabapentin (NEURONTIN) 600 MG tablet, , Disp: , Rfl:    ibuprofen (ADVIL) 800 MG tablet, Take 1 tablet (800 mg total) by mouth every 6 (six) hours as needed., Disp: 60 tablet, Rfl: 1   Inositol 500 MG TABS, Take 500 mg by mouth daily., Disp: , Rfl:    L-Methylfolate 15 MG TABS, Take 15 mg by mouth daily. , Disp: , Rfl:    levothyroxine (SYNTHROID) 137 MCG tablet, Take 137 mcg by mouth daily before breakfast., Disp: , Rfl:    Melatonin 5 MG CAPS, Take 1 capsule by mouth at bedtime., Disp: , Rfl:    mirtazapine (REMERON) 7.5 MG tablet, Take 7.5 mg by mouth at bedtime., Disp: , Rfl:    OLANZapine (ZYPREXA) 10 MG tablet, Take 10 mg by mouth at bedtime., Disp: , Rfl:    oxyCODONE-acetaminophen (PERCOCET) 5-325 MG tablet, Take 1-2 tablets by mouth every 4 (four) hours as needed for severe pain., Disp: 30 tablet, Rfl: 0   PENNSAID 2 % SOLN, SMARTSIG:2 Pump Topical Twice Daily, Disp: , Rfl:    ramelteon (ROZEREM) 8 MG tablet, Take 1 tablet (8 mg total) by mouth at bedtime., Disp: 30 tablet, Rfl: 0   traZODone (DESYREL) 100 MG tablet, Take 150 mg by mouth  at bedtime., Disp: , Rfl: 1   valACYclovir (VALTREX) 1000 MG tablet, Take 1 g by mouth daily., Disp: , Rfl: 1  Social History   Tobacco Use  Smoking Status Never  Smokeless Tobacco Never    Allergies  Allergen Reactions   Clarithromycin Nausea And Vomiting   Codeine Other (See Comments)    headache   Codeine Other (See Comments)    h/a   Erythromycin Nausea And Vomiting   Erythromycin Nausea And Vomiting   Objective:  There were no vitals filed for this visit. There is no height or weight on file to calculate BMI. Constitutional Well developed. Well nourished.  Vascular Foot warm and well perfused. Capillary refill normal to all digits.   Neurologic Normal speech. Oriented to person, place, and time. Epicritic sensation to light touch grossly present bilaterally.  Dermatologic Skin healing well  without signs of infection. Skin edges well coapted without signs of infection.  Orthopedic: Tenderness to palpation noted about the surgical site.  Mild pain on palpation to the right second and third metatarsal stress fracture.  Pain with range of motion of the MPJ joint of the right second and third.   Radiographs: 3 views of skeletally mature the right foot: Good correction alignment noted.  Hardware is intact.  No backing out or loosening of the hardware noted.  Bone callus formation noted across the stress fractures of second and third metatarsal.  Appears to be healing adequately  Assessment:   1. 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 Stress fracture, right foot, sequela   3. Status post foot surgery    Plan:  Patient was evaluated and treated and all questions answered.  S/p foot surgery right -Progressing as expected post-operatively. -XR: See above -WB Status: Begin weightbearing as tolerated in a cam boot in 2 weeks. -Sutures: Removed no clinical signs of dehiscence noted.  No complication noted. -Medications: None -Start bowel stimulator  Stress fracture of second and third metatarsal -She will complete immobilizer foot and be nonweightbearing to the right lower extremity with a knee scooter.  At this time it is not displaced and not radiographically misaligned or angulated I believe patient may benefit from conservative treatment options for now. -Begin bone stimulator. -X-rays show bone callus forming around the fracture site.  No follow-ups on file.

## 2021-04-23 ENCOUNTER — Other Ambulatory Visit: Payer: Self-pay

## 2021-04-23 ENCOUNTER — Ambulatory Visit (INDEPENDENT_AMBULATORY_CARE_PROVIDER_SITE_OTHER): Payer: BC Managed Care – PPO | Admitting: Podiatry

## 2021-04-23 DIAGNOSIS — M84374S Stress fracture, right foot, sequela: Secondary | ICD-10-CM

## 2021-04-23 DIAGNOSIS — S92344K Nondisplaced fracture of fourth metatarsal bone, right foot, subsequent encounter for fracture with nonunion: Secondary | ICD-10-CM

## 2021-04-23 DIAGNOSIS — Z9889 Other specified postprocedural states: Secondary | ICD-10-CM

## 2021-04-24 ENCOUNTER — Encounter: Payer: Self-pay | Admitting: Podiatry

## 2021-04-24 NOTE — Progress Notes (Signed)
Subjective:  Patient ID: Carla [name], female    DOB: 1961/05/13,  MRN: [id number]  Chief Complaint  Patient presents with   Routine Post Op    POST OP DOS 5.16.22    DOS: 03/03/2021 Procedure: Right ORIF of nonhealing fracture metatarsal  60 y.o. female returns for post-op check.  Patient states she is doing well.  She still has occasional pain.  She has been weightbearing as tolerated in a boot.  I encouraged her to start ambulating more with the boot and in 2 weeks to begin transition to regular shoes.  She states understanding  Review of Systems: Negative except as noted in the HPI. Denies N/V/F/Ch.  Past Medical History:  Diagnosis Date   Allergy    Anxiety    Arthritis    Depression    Endometriosis    Episode of heavy vaginal bleeding 03/31/2015   Gait abnormality 05/11/2019   Hypothyroidism    Osteoporosis    Thyroid disease    hypothyroidism   Thyroid disease     Current Outpatient Medications:    acetaminophen (TYLENOL) 325 MG tablet, You can take 2 tablets every 4 hours as needed for pain.  You can alternate this with ibuprofen.  You can buy this over-the-counter at any drugstore. DO NOT TAKE MORE THAN 4000 MG OF TYLENOL PER DAY.  IT CAN HARM YOUR LIVER.  TYLENOL (ACETAMINOPHEN) IS ALSO IN YOUR PRESCRIPTION PAIN MEDICATION.  YOU HAVE TO COUNT IT IN YOUR DAILY TOTAL., Disp: , Rfl:    acetaminophen-codeine (TYLENOL #3) 300-30 MG tablet, Take 1-2 tablets by mouth every 4 (four) hours as needed for moderate pain., Disp: 30 tablet, Rfl: 0   celecoxib (CELEBREX) 200 MG capsule, Take 200 mg by mouth daily., Disp: , Rfl:    cyclobenzaprine (FLEXERIL) 10 MG tablet, Take 10 mg by mouth at bedtime., Disp: , Rfl: 3   desvenlafaxine (PRISTIQ) 50 MG 24 hr tablet, Take 50 mg by mouth daily., Disp: , Rfl: 1   docusate sodium (COLACE) 100 MG capsule, Take 100 mg by mouth 2 (two) times daily. , Disp: , Rfl:    EVENITY 105 MG/1. SOSY injection, INJECT 2 SYRINGE SUBCUTANEOUSLY  ONCE A MONTH, Disp: 2.34 mL, Rfl: 12   gabapentin (NEURONTIN) 600 MG tablet, , Disp: , Rfl:    ibuprofen (ADVIL) 800 MG tablet, Take 1 tablet (800 mg total) by mouth every 6 (six) hours as needed., Disp: 60 tablet, Rfl: 1   Inositol 500 MG TABS, Take 500 mg by mouth daily., Disp: , Rfl:    L-Methylfolate 15 MG TABS, Take 15 mg by mouth daily. , Disp: , Rfl:    levothyroxine (SYNTHROID) 137 MCG tablet, Take 137 mcg by mouth daily before breakfast., Disp: , Rfl:    Melatonin 5 MG CAPS, Take 1 capsule by mouth at bedtime., Disp: , Rfl:    mirtazapine (REMERON) 7.5 MG tablet, Take 7.5 mg by mouth at bedtime., Disp: , Rfl:    OLANZapine (ZYPREXA) 10 MG tablet, Take 10 mg by mouth at bedtime., Disp: , Rfl:    oxyCODONE-acetaminophen (PERCOCET) 5-325 MG tablet, Take 1-2 tablets by mouth every 4 (four) hours as needed for severe pain., Disp: 30 tablet, Rfl: 0   PENNSAID 2 % SOLN, SMARTSIG:2 Pump Topical Twice Daily, Disp: , Rfl:    ramelteon (ROZEREM) 8 MG tablet, Take 1 tablet (8 mg total) by mouth at bedtime., Disp: 30 tablet, Rfl: 0   traZODone (DESYREL) 100 MG tablet, Take 150 mg by  mouth at bedtime., Disp: , Rfl: 1   valACYclovir (VALTREX) 1000 MG tablet, Take 1 g by mouth daily., Disp: , Rfl: 1  Social History   Tobacco Use  Smoking Status Never  Smokeless Tobacco Never    Allergies  Allergen Reactions   Clarithromycin Nausea And Vomiting   Codeine Other (See Comments)    headache   Codeine Other (See Comments)    h/a   Erythromycin Nausea And Vomiting   Erythromycin Nausea And Vomiting   Objective:  There were no vitals filed for this visit. There is no height or weight on file to calculate BMI. Constitutional Well developed. Well nourished.  Vascular Foot warm and well perfused. Capillary refill normal to all digits.   Neurologic Normal speech. Oriented to person, place, and time. Epicritic sensation to light touch grossly present bilaterally.  Dermatologic Skin healing well  without signs of infection. Skin edges well coapted without signs of infection.  Orthopedic: Mild tenderness to palpation noted about the surgical site.  Mild pain on palpation to the right second and third metatarsal stress fracture.  Pain with range of motion of the MPJ joint of the right second and third.   Radiographs: 3 views of skeletally mature the right foot: Good correction alignment noted.  Hardware is intact.  No backing out or loosening of the hardware noted.  Bone callus formation noted across the stress fractures of second and third metatarsal.  Appears to be healing adequately  Assessment:   1. 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 Stress fracture, right foot, sequela   3. Status post foot surgery     Plan:  Patient was evaluated and treated and all questions answered.  S/p foot surgery right -Progressing as expected post-operatively. -XR: See above -WB Status: Begin transition to regular shoes -Sutures: Removed no clinical signs of dehiscence noted.  No complication noted. -Medications: None -Get repeat x-ray during next clinic visit  Stress fracture of second and third metatarsal -She will complete immobilizer foot and be nonweightbearing to the right lower extremity with a knee scooter.  At this time it is not displaced and not radiographically misaligned or angulated I believe patient may benefit from conservative treatment options for now. -Begin bone stimulator. -X-rays show bone callus forming around the fracture site.  No follow-ups on file.

## 2021-05-12 DIAGNOSIS — F322 Major depressive disorder, single episode, severe without psychotic features: Secondary | ICD-10-CM | POA: Diagnosis not present

## 2021-05-21 ENCOUNTER — Ambulatory Visit (INDEPENDENT_AMBULATORY_CARE_PROVIDER_SITE_OTHER): Payer: BC Managed Care – PPO

## 2021-05-21 ENCOUNTER — Ambulatory Visit (INDEPENDENT_AMBULATORY_CARE_PROVIDER_SITE_OTHER): Payer: BC Managed Care – PPO | Admitting: Podiatry

## 2021-05-21 ENCOUNTER — Other Ambulatory Visit: Payer: Self-pay

## 2021-05-21 DIAGNOSIS — S92344K Nondisplaced fracture of fourth metatarsal bone, right foot, subsequent encounter for fracture with nonunion: Secondary | ICD-10-CM

## 2021-05-21 DIAGNOSIS — F4310 Post-traumatic stress disorder, unspecified: Secondary | ICD-10-CM | POA: Diagnosis not present

## 2021-05-21 DIAGNOSIS — F411 Generalized anxiety disorder: Secondary | ICD-10-CM | POA: Diagnosis not present

## 2021-05-21 DIAGNOSIS — M84374S Stress fracture, right foot, sequela: Secondary | ICD-10-CM

## 2021-05-21 DIAGNOSIS — M79671 Pain in right foot: Secondary | ICD-10-CM | POA: Diagnosis not present

## 2021-05-21 DIAGNOSIS — F331 Major depressive disorder, recurrent, moderate: Secondary | ICD-10-CM | POA: Diagnosis not present

## 2021-05-23 ENCOUNTER — Encounter: Payer: Self-pay | Admitting: Podiatry

## 2021-05-23 NOTE — Progress Notes (Signed)
Subjective:  Patient ID: Carla [name], female    DOB: [birth year],  MRN: [id number]  Chief Complaint  Patient presents with   Routine Post Op    POS DOS 5.16.22    DOS: 03/03/2021 Procedure: Right ORIF of nonhealing fracture metatarsal  60 y.o. female returns for post-op check.  Patient states she is doing well.  She has began weightbearing tolerating in a boot.  She denies any other acute complaints.  She has also tried wearing shoes and seems to be doing well.  She denies any other acute complaints.  Review of Systems: Negative except as noted in the HPI. Denies N/V/F/Ch.  Past Medical History:  Diagnosis Date   Allergy    Anxiety    Arthritis    Depression    Endometriosis    Episode of heavy vaginal bleeding 03/31/2015   Gait abnormality 05/11/2019   Hypothyroidism    Osteoporosis    Thyroid disease    hypothyroidism   Thyroid disease     Current Outpatient Medications:    acetaminophen (TYLENOL) 325 MG tablet, You can take 2 tablets every 4 hours as needed for pain.  You can alternate this with ibuprofen.  You can buy this over-the-counter at any drugstore. DO NOT TAKE MORE THAN 4000 MG OF TYLENOL PER DAY.  IT CAN HARM YOUR LIVER.  TYLENOL (ACETAMINOPHEN) IS ALSO IN YOUR PRESCRIPTION PAIN MEDICATION.  YOU HAVE TO COUNT IT IN YOUR DAILY TOTAL., Disp: , Rfl:    acetaminophen-codeine (TYLENOL #3) 300-30 MG tablet, Take 1-2 tablets by mouth every 4 (four) hours as needed for moderate pain., Disp: 30 tablet, Rfl: 0   celecoxib (CELEBREX) 200 MG capsule, Take 200 mg by mouth daily., Disp: , Rfl:    cyclobenzaprine (FLEXERIL) 10 MG tablet, Take 10 mg by mouth at bedtime., Disp: , Rfl: 3   desvenlafaxine (PRISTIQ) 50 MG 24 hr tablet, Take 50 mg by mouth daily., Disp: , Rfl: 1   docusate sodium (COLACE) 100 MG capsule, Take 100 mg by mouth 2 (two) times daily. , Disp: , Rfl:    EVENITY 105 MG/1. SOSY injection, INJECT 2 SYRINGE SUBCUTANEOUSLY ONCE A MONTH, Disp: 2.34 mL, Rfl:  12   gabapentin (NEURONTIN) 600 MG tablet, , Disp: , Rfl:    ibuprofen (ADVIL) 800 MG tablet, Take 1 tablet (800 mg total) by mouth every 6 (six) hours as needed., Disp: 60 tablet, Rfl: 1   Inositol 500 MG TABS, Take 500 mg by mouth daily., Disp: , Rfl:    L-Methylfolate 15 MG TABS, Take 15 mg by mouth daily. , Disp: , Rfl:    levothyroxine (SYNTHROID) 137 MCG tablet, Take 137 mcg by mouth daily before breakfast., Disp: , Rfl:    Melatonin 5 MG CAPS, Take 1 capsule by mouth at bedtime., Disp: , Rfl:    mirtazapine (REMERON) 7.5 MG tablet, Take 7.5 mg by mouth at bedtime., Disp: , Rfl:    OLANZapine (ZYPREXA) 10 MG tablet, Take 10 mg by mouth at bedtime., Disp: , Rfl:    oxyCODONE-acetaminophen (PERCOCET) 5-325 MG tablet, Take 1-2 tablets by mouth every 4 (four) hours as needed for severe pain., Disp: 30 tablet, Rfl: 0   PENNSAID 2 % SOLN, SMARTSIG:2 Pump Topical Twice Daily, Disp: , Rfl:    ramelteon (ROZEREM) 8 MG tablet, Take 1 tablet (8 mg total) by mouth at bedtime., Disp: 30 tablet, Rfl: 0   traZODone (DESYREL) 100 MG tablet, Take 150 mg by mouth at bedtime., Disp: , Rfl:  1   valACYclovir (VALTREX) 1000 MG tablet, Take 1 g by mouth daily., Disp: , Rfl: 1  Social History   Tobacco Use  Smoking Status Never  Smokeless Tobacco Never    Allergies  Allergen Reactions   Clarithromycin Nausea And Vomiting   Codeine Other (See Comments)    headache   Codeine Other (See Comments)    h/a   Erythromycin Nausea And Vomiting   Erythromycin Nausea And Vomiting   Objective:  There were no vitals filed for this visit. There is no height or weight on file to calculate BMI. Constitutional Well developed. Well nourished.  Vascular Foot warm and well perfused. Capillary refill normal to all digits.   Neurologic Normal speech. Oriented to person, place, and time. Epicritic sensation to light touch grossly present bilaterally.  Dermatologic Skin healing well without signs of infection. Skin  edges well coapted without signs of infection.  Orthopedic: Mild tenderness to palpation noted about the surgical site.  Mild pain on palpation to the right second and third metatarsal stress fracture.  Pain with range of motion of the MPJ joint of the right second and third.   Radiographs: 3 views of skeletally mature the right foot: Good correction alignment noted.  Hardware is intact.  No backing out or loosening of the hardware noted.  Bone callus formation noted across the stress fractures of second and third metatarsal.  Appears to be healing adequately  Assessment:   1. 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 Stress fracture, right foot, sequela     Plan:  Patient was evaluated and treated and all questions answered.  S/p foot surgery right -Progressing as expected post-operatively. -XR: See above -WB Status: Begin transition to regular shoes -Sutures: Removed no clinical signs of dehiscence noted.  No complication noted. -Medications: None -Repeat x-rays show clinically radiographic osseous healing.  No screws are bit loosening noted.  At this time I have encouraged encouraged her to start utilizing the foot in regular shoes.  She can discontinue the bone stimulator as well.  Stress fracture of second and third metatarsal -She can be can weightbearing as tolerated in regular shoes. -Discontinue begin bone stimulator. -X-rays show bone callus around the stress fracture site. No follow-ups on file.

## 2021-05-28 DIAGNOSIS — N3001 Acute cystitis with hematuria: Secondary | ICD-10-CM | POA: Diagnosis not present

## 2021-05-28 DIAGNOSIS — R300 Dysuria: Secondary | ICD-10-CM | POA: Diagnosis not present

## 2021-07-02 ENCOUNTER — Encounter: Payer: Self-pay | Admitting: Internal Medicine

## 2021-07-04 DIAGNOSIS — F411 Generalized anxiety disorder: Secondary | ICD-10-CM | POA: Diagnosis not present

## 2021-07-21 DIAGNOSIS — F332 Major depressive disorder, recurrent severe without psychotic features: Secondary | ICD-10-CM | POA: Diagnosis not present

## 2021-07-28 DIAGNOSIS — H524 Presbyopia: Secondary | ICD-10-CM | POA: Diagnosis not present

## 2021-07-28 DIAGNOSIS — H04123 Dry eye syndrome of bilateral lacrimal glands: Secondary | ICD-10-CM | POA: Diagnosis not present

## 2021-07-28 DIAGNOSIS — H2513 Age-related nuclear cataract, bilateral: Secondary | ICD-10-CM | POA: Diagnosis not present

## 2021-07-29 DIAGNOSIS — F331 Major depressive disorder, recurrent, moderate: Secondary | ICD-10-CM | POA: Diagnosis not present

## 2021-07-29 DIAGNOSIS — F4310 Post-traumatic stress disorder, unspecified: Secondary | ICD-10-CM | POA: Diagnosis not present

## 2021-07-29 DIAGNOSIS — F411 Generalized anxiety disorder: Secondary | ICD-10-CM | POA: Diagnosis not present

## 2021-07-30 ENCOUNTER — Other Ambulatory Visit: Payer: Self-pay

## 2021-07-30 ENCOUNTER — Ambulatory Visit (AMBULATORY_SURGERY_CENTER): Payer: BC Managed Care – PPO

## 2021-07-30 VITALS — Ht 64.0 in | Wt 170.0 lb

## 2021-07-30 DIAGNOSIS — Z1211 Encounter for screening for malignant neoplasm of colon: Secondary | ICD-10-CM

## 2021-07-30 NOTE — Progress Notes (Signed)

## 2021-07-31 ENCOUNTER — Encounter: Payer: Self-pay | Admitting: Internal Medicine

## 2021-08-01 DIAGNOSIS — F331 Major depressive disorder, recurrent, moderate: Secondary | ICD-10-CM | POA: Diagnosis not present

## 2021-08-01 DIAGNOSIS — F411 Generalized anxiety disorder: Secondary | ICD-10-CM | POA: Diagnosis not present

## 2021-08-06 DIAGNOSIS — Z0000 Encounter for general adult medical examination without abnormal findings: Secondary | ICD-10-CM | POA: Diagnosis not present

## 2021-08-08 DIAGNOSIS — M810 Age-related osteoporosis without current pathological fracture: Secondary | ICD-10-CM | POA: Diagnosis not present

## 2021-08-08 DIAGNOSIS — Z23 Encounter for immunization: Secondary | ICD-10-CM | POA: Diagnosis not present

## 2021-08-08 DIAGNOSIS — F411 Generalized anxiety disorder: Secondary | ICD-10-CM | POA: Diagnosis not present

## 2021-08-08 DIAGNOSIS — Z0000 Encounter for general adult medical examination without abnormal findings: Secondary | ICD-10-CM | POA: Diagnosis not present

## 2021-08-08 DIAGNOSIS — E039 Hypothyroidism, unspecified: Secondary | ICD-10-CM | POA: Diagnosis not present

## 2021-08-14 ENCOUNTER — Other Ambulatory Visit: Payer: Self-pay

## 2021-08-14 ENCOUNTER — Ambulatory Visit (AMBULATORY_SURGERY_CENTER): Payer: BC Managed Care – PPO | Admitting: Internal Medicine

## 2021-08-14 ENCOUNTER — Encounter: Payer: Self-pay | Admitting: Internal Medicine

## 2021-08-14 ENCOUNTER — Telehealth: Payer: Self-pay | Admitting: Internal Medicine

## 2021-08-14 VITALS — BP 118/67 | HR 76 | Temp 97.1°F | Resp 20 | Ht 64.0 in | Wt 170.0 lb

## 2021-08-14 DIAGNOSIS — Z1211 Encounter for screening for malignant neoplasm of colon: Secondary | ICD-10-CM | POA: Diagnosis not present

## 2021-08-14 MED ORDER — SODIUM CHLORIDE 0.9 % IV SOLN: 500.0000 mL | INTRAVENOUS | Status: DC

## 2021-08-14 NOTE — Op Note (Signed)
[facility] Patient Name: Carla [name] Procedure Date: 08/14/2021 8:00 AM MRN: [id number] Endoscopist: Iva Boop , MD Age: 60 Referring MD:  Date of Birth: 04-Dec-1960 Gender: Female Account #: [id number] Procedure:                Colonoscopy Indications:              Screening for colorectal malignant neoplasm, Last                            colonoscopy: 2012 Medicines:                Propofol per Anesthesia, Monitored Anesthesia Care Procedure:                Pre-Anesthesia Assessment:                           - Prior to the procedure, a History and Physical                            was performed, and patient medications and                            allergies were reviewed. The patient's tolerance of                            previous anesthesia was also reviewed. The risks                            and benefits of the procedure and the sedation                            options and risks were discussed with the patient.                            All questions were answered, and informed consent                            was obtained. Prior Anticoagulants: The patient has                            taken no previous anticoagulant or antiplatelet                            agents. ASA Grade Assessment: II - A patient with                            mild systemic disease. After reviewing the risks                            and benefits, the patient was deemed in                            satisfactory condition to undergo the procedure.  After obtaining informed consent, the colonoscope                            was passed under direct vision. Throughout the                            procedure, the patient's blood pressure, pulse, and                            oxygen saturations were monitored continuously. The                            Olympus PCF-H190DL (#[id number]) Colonoscope was                            introduced through  the anus and advanced to the the                            cecum, identified by appendiceal orifice and                            ileocecal valve. The colonoscopy was performed                            without difficulty. The patient tolerated the                            procedure well. The quality of the bowel                            preparation was good. The ileocecal valve,                            appendiceal orifice, and rectum were photographed.                            The bowel preparation used was Miralax via split                            dose instruction. Scope In: 8:10:16 AM Scope Out: 8:32:20 AM Scope Withdrawal Time: 0 hours 13 minutes 37 seconds  Total Procedure Duration: 0 hours 22 minutes 4 seconds  Findings:                 The perianal and digital rectal examinations were                            normal.                           Multiple diverticula were found in the sigmoid                            colon.  The exam was otherwise without abnormality on                            direct and retroflexion views. Complications:            No immediate complications. Estimated Blood Loss:     Estimated blood loss: none. Impression:               - Diverticulosis in the sigmoid colon.                           - The examination was otherwise normal on direct                            and retroflexion views.                           - No specimens collected. Recommendation:           - Patient has a contact number available for                            emergencies. The signs and symptoms of potential                            delayed complications were discussed with the                            patient. Return to normal activities tomorrow.                            Written discharge instructions were provided to the                            patient.                           - Resume previous diet.                            - Continue present medications.                           - Repeat colonoscopy in 10 years for screening                            purposes. Iva Boop, MD 08/14/2021 8:41:25 AM This report has been signed electronically.

## 2021-08-14 NOTE — Telephone Encounter (Signed)
Patient had procedure this morning.  Still having a lot of liquid diarrhea.  Please call and advise.

## 2021-08-14 NOTE — Progress Notes (Signed)
[facility] History and Physical   Primary Care Physician:  Georgianne Fick, MD   Reason for Procedure:   Colon cancer screening  Plan:    colonoscopy     HPI: Carla [name] is a 60 y.o. female here for a screening colonoscopy   Past Medical History:  Diagnosis Date   Allergy    Anemia    Anxiety    Arthritis    Blood transfusion without reported diagnosis    Depression    Endometriosis    Episode of heavy vaginal bleeding 03/31/2015   Gait abnormality 05/11/2019   Hypothyroidism    Insomnia    recieve electoconvulsive therepy at [facility] to help with Insomnia   Osteoporosis    Thyroid disease    hypothyroidism   Thyroid disease     Past Surgical History:  Procedure Laterality Date   APPENDECTOMY     BACK SURGERY     COLONOSCOPY  04/28/2011   Normal   cyst left 2nd finger     DILATION AND CURETTAGE OF UTERUS     DILITATION & CURRETTAGE/HYSTROSCOPY WITH NOVASURE ABLATION N/A 03/14/2015   Procedure: DILATATION & CURETTAGE/HYSTEROSCOPY WITH POSSIBLE NOVASURE ABLATION;  Surgeon: Olivia Mackie, MD;  Location: [facility];  Service: Gynecology;  Laterality: N/A;   LAPAROSCOPIC APPENDECTOMY N/A 01/02/2019   Procedure: APPENDECTOMY LAPAROSCOPIC;  Surgeon: Ovidio Kin, MD;  Location: [facility];  Service: General;  Laterality: N/A;   LAPAROSCOPY ABDOMEN DIAGNOSTIC     left shoulder arthroscopy     LUMBAR DISC SURGERY     metatasral fracture Right    Plate to right metatarsal fx - May 2022   SHOULDER SURGERY     tonsillectomy and adnoidectomy     as a child    Prior to Admission medications   Medication Sig Start Date End Date Taking? Authorizing Provider  acetaminophen (TYLENOL) 325 MG tablet You can take 2 tablets every 4 hours as needed for pain.  You can alternate this with ibuprofen.  You can buy this over-the-counter at any drugstore. DO NOT TAKE MORE THAN 4000 MG OF TYLENOL PER DAY.  IT CAN HARM YOUR LIVER.  TYLENOL (ACETAMINOPHEN) IS ALSO IN YOUR  PRESCRIPTION PAIN MEDICATION.  YOU HAVE TO COUNT IT IN YOUR DAILY TOTAL. 01/04/19  Yes Sherrie George, PA-C  cetirizine (ZYRTEC) 10 MG tablet Take 10 mg by mouth daily.   Yes [provider]  cyclobenzaprine (FLEXERIL) 10 MG tablet Take 10 mg by mouth at bedtime. 02/11/18  Yes [provider]  desvenlafaxine (PRISTIQ) 50 MG 24 hr tablet Take 50 mg by mouth daily. 05/24/18  Yes [provider]  docusate sodium (COLACE) 100 MG capsule Take 100 mg by mouth 2 (two) times daily.    Yes [provider]  gabapentin (NEURONTIN) 300 MG capsule Take 600 mg by mouth at bedtime. 07/29/21  Yes [provider]  ibuprofen (ADVIL) 800 MG tablet Take 1 tablet (800 mg total) by mouth every 6 (six) hours as needed. 03/03/21  Yes Candelaria Stagers, DPM  Inositol 500 MG TABS Take 500 mg by mouth daily.   Yes [provider]  L-Methylfolate 15 MG TABS Take 15 mg by mouth daily.    Yes [provider]  Melatonin 5 MG CAPS Take 1 capsule by mouth at bedtime.   Yes [provider]  mirtazapine (REMERON) 15 MG tablet Take 15 mg by mouth at bedtime. 07/29/21  Yes [provider]  mometasone (NASONEX) 50 MCG/ACT nasal spray  2 sprays in each nostril 10/28/12  Yes [provider]  OLANZapine (ZYPREXA) 15 MG tablet Take 15 mg by mouth at bedtime. 07/29/21  Yes [provider]  SYNTHROID 175 MCG tablet Take 175 mcg by mouth every morning. 07/12/21  Yes [provider]  valACYclovir (VALTREX) 1000 MG tablet Take 1 g by mouth daily. 06/16/18  Yes [provider]  Cholecalciferol 25 MCG (1000 UT) tablet Take by mouth. Patient not taking: Reported on 08/14/2021    [provider]  gabapentin (NEURONTIN) 600 MG tablet  04/17/19   [provider]  levothyroxine (SYNTHROID) 137 MCG tablet Take 137 mcg by mouth daily before breakfast.    [provider]  mirtazapine (REMERON) 7.5 MG tablet Take 7.5 mg by  mouth at bedtime.    [provider]  ramelteon (ROZEREM) 8 MG tablet Take 1 tablet (8 mg total) by mouth at bedtime. Patient not taking: Reported on 08/14/2021 07/11/18   Oneta Rack, NP  Teriparatide, Recombinant, 600 MCG/2.4ML SOPN Inject into the skin. Patient not taking: Reported on 08/14/2021    [provider]  valACYclovir (VALTREX) 500 MG tablet Take by mouth. Patient not taking: Reported on 08/14/2021 06/16/18   [provider]    Current Outpatient Medications  Medication Sig Dispense Refill   acetaminophen (TYLENOL) 325 MG tablet You can take 2 tablets every 4 hours as needed for pain.  You can alternate this with ibuprofen.  You can buy this over-the-counter at any drugstore. DO NOT TAKE MORE THAN 4000 MG OF TYLENOL PER DAY.  IT CAN HARM YOUR LIVER.  TYLENOL (ACETAMINOPHEN) IS ALSO IN YOUR PRESCRIPTION PAIN MEDICATION.  YOU HAVE TO COUNT IT IN YOUR DAILY TOTAL.     cetirizine (ZYRTEC) 10 MG tablet Take 10 mg by mouth daily.     cyclobenzaprine (FLEXERIL) 10 MG tablet Take 10 mg by mouth at bedtime.  3   desvenlafaxine (PRISTIQ) 50 MG 24 hr tablet Take 50 mg by mouth daily.  1   docusate sodium (COLACE) 100 MG capsule Take 100 mg by mouth 2 (two) times daily.      gabapentin (NEURONTIN) 300 MG capsule Take 600 mg by mouth at bedtime.     ibuprofen (ADVIL) 800 MG tablet Take 1 tablet (800 mg total) by mouth every 6 (six) hours as needed. 60 tablet 1   Inositol 500 MG TABS Take 500 mg by mouth daily.     L-Methylfolate 15 MG TABS Take 15 mg by mouth daily.      Melatonin 5 MG CAPS Take 1 capsule by mouth at bedtime.     mirtazapine (REMERON) 15 MG tablet Take 15 mg by mouth at bedtime.     mometasone (NASONEX) 50 MCG/ACT nasal spray 2 sprays in each nostril     OLANZapine (ZYPREXA) 15 MG tablet Take 15 mg by mouth at bedtime.     SYNTHROID 175 MCG tablet Take 175 mcg by mouth every morning.     valACYclovir (VALTREX) 1000 MG tablet Take 1 g by mouth  daily.  1   Cholecalciferol 25 MCG (1000 UT) tablet Take by mouth. (Patient not taking: Reported on 08/14/2021)     gabapentin (NEURONTIN) 600 MG tablet      levothyroxine (SYNTHROID) 137 MCG tablet Take 137 mcg by mouth daily before breakfast.     mirtazapine (REMERON) 7.5 MG tablet Take 7.5 mg by mouth at bedtime.     ramelteon (ROZEREM) 8 MG tablet Take 1 tablet (  8 mg total) by mouth at bedtime. (Patient not taking: Reported on 08/14/2021) 30 tablet 0   Teriparatide, Recombinant, 600 MCG/2.4ML SOPN Inject into the skin. (Patient not taking: Reported on 08/14/2021)     valACYclovir (VALTREX) 500 MG tablet Take by mouth. (Patient not taking: Reported on 08/14/2021)     Current Facility-Administered Medications  Medication Dose Route Frequency Provider Last Rate Last Admin   0.9 %  sodium chloride infusion  500 mL Intravenous Continuous Iva Boop, MD        Allergies as of 08/14/2021 - Review Complete 08/14/2021  Allergen Reaction Noted   Clarithromycin Nausea And Vomiting 04/14/2011   Codeine Other (See Comments) 04/14/2011   Codeine Other (See Comments) 07/04/2018   Doxycycline calcium Nausea Only and Nausea And Vomiting 05/28/2021   Erythromycin Nausea And Vomiting 04/14/2011   Erythromycin Nausea And Vomiting 07/04/2018    Family History  Problem Relation Age of Onset   Alcohol abuse Father    Bipolar disorder Father    Colon cancer Neg Hx    Colon polyps Neg Hx    Esophageal cancer Neg Hx    Rectal cancer Neg Hx    Stomach cancer Neg Hx     Social History   Socioeconomic History   Marital status: Divorced    Spouse name: Not on file   Number of children: Not on file   Years of education: Not on file   Highest education level: Not on file  Occupational History   Not on file  Tobacco Use   Smoking status: Never   Smokeless tobacco: Never  Vaping Use   Vaping Use: Never used  Substance and Sexual Activity   Alcohol use: Never   Drug use: Never   Sexual  activity: Not Currently  Other Topics Concern   Not on file  Social History Narrative   ** Merged History Encounter **        Review of Systems:  All other review of systems negative except as mentioned in the HPI.  Physical Exam: Vital signs BP 127/72   Pulse 97   Temp (!) 97.1 F (36.2 C)   Ht 5\' 4"  (1.626 m)   Wt 170 lb (77.1 kg)   LMP 04/22/2011   SpO2 98%   BMI 29.18 kg/m   General:   Alert,  Well-developed, well-nourished, pleasant and cooperative in NAD Lungs:  Clear throughout to auscultation.   Heart:  Regular rate and rhythm; no murmurs, clicks, rubs,  or gallops. Abdomen:  Soft, nontender and nondistended. Normal bowel sounds.   Neuro/Psych:  Alert and cooperative. Normal mood and affect. A and O x 3   @Naia Ruff  06/23/2011, MD, [facility] [phone] (pager) 08/14/2021 8:01 AM@

## 2021-08-14 NOTE — Patient Instructions (Addendum)
No polyps or cancer identified.  You do have diverticulosis - thickened muscle rings and pouches in the colon wall. Please read the handout about this condition.  Next routine colonoscopy or other screening test in 10 years - 2032.  I appreciate the opportunity to care for you. Iva Boop, MD, FACG   YOU HAD AN ENDOSCOPIC PROCEDURE TODAY AT THE [facility]:   Refer to the procedure report that was given to you for any specific questions about what was found during the examination.  If the procedure report does not answer your questions, please call your gastroenterologist to clarify.  If you requested that your care partner not be given the details of your procedure findings, then the procedure report has been included in a sealed envelope for you to review at your convenience later.  YOU SHOULD EXPECT: Some feelings of bloating in the abdomen. Passage of more gas than usual.  Walking can help get rid of the air that was put into your GI tract during the procedure and reduce the bloating. If you had a lower endoscopy (such as a colonoscopy or flexible sigmoidoscopy) you may notice spotting of blood in your stool or on the toilet paper. If you underwent a bowel prep for your procedure, you may not have a normal bowel movement for a few days.  Please Note:  You might notice some irritation and congestion in your nose or some drainage.  This is from the oxygen used during your procedure.  There is no need for concern and it should clear up in a day or so.  SYMPTOMS TO REPORT IMMEDIATELY:  Following lower endoscopy (colonoscopy or flexible sigmoidoscopy):  Excessive amounts of blood in the stool  Significant tenderness or worsening of abdominal pains  Swelling of the abdomen that is new, acute  Fever of 100F or higher  For urgent or emergent issues, a gastroenterologist can be reached at any hour by calling [phone]. Do not use MyChart messaging for urgent concerns.     DIET:  We do recommend a small meal at first, but then you may proceed to your regular diet.  Drink plenty of fluids but you should avoid alcoholic beverages for 24 hours.  ACTIVITY:  You should plan to take it easy for the rest of today and you should NOT DRIVE or use heavy machinery until tomorrow (because of the sedation medicines used during the test).    FOLLOW UP: Our staff will call the number listed on your records 48-72 hours following your procedure to check on you and address any questions or concerns that you may have regarding the information given to you following your procedure. If we do not reach you, we will leave a message.  We will attempt to reach you two times.  During this call, we will ask if you have developed any symptoms of COVID 19. If you develop any symptoms (ie: fever, flu-like symptoms, shortness of breath, cough etc.) before then, please call [phone].  If you test positive for Covid 19 in the 2 weeks post procedure, please call and report this information to [place].    If any biopsies were taken you will be contacted by phone or by letter within the next 1-3 weeks.  Please call us at [phone] if you have not heard about the biopsies in 3 weeks.    SIGNATURES/CONFIDENTIALITY: You and/or your care partner have signed paperwork which will be entered into your electronic medical record.  These signatures  attest to the fact that that the information above on your After Visit Summary has been reviewed and is understood.  Full responsibility of the confidentiality of this discharge information lies with you and/or your care-partner.

## 2021-08-14 NOTE — Telephone Encounter (Signed)
Returned pt's call. Educated pt that she may not have a normal bowel movement for a few days after the bowel prep and that includes continuing to have loose bowel movements even after the procedure. Pt reports that she has not seen any blood in her stools, which is not expected since no specimens were collected and no hemorrhoids seen on exam. Pt asked if she could take Imodium for the loose bowel movements and RN instructed that she could take it per recommendations on the bottle. Instructed pt to call back if her loose bowel movements do not improve in the new couple of days or if she starts seeing blood in her stool. Pt agreeable to plan of care.

## 2021-08-14 NOTE — Progress Notes (Signed)
Pt's states no medical or surgical changes since previsit or office visit. Vitals done by DT. 

## 2021-08-19 ENCOUNTER — Telehealth: Payer: Self-pay

## 2021-08-19 NOTE — Telephone Encounter (Signed)
Attempted to reach pt. With follow-up call following endoscopic procedure 08/14/2021.  LM on pt. Voice mail to call if she has any questions or concerns. 

## 2021-08-20 DIAGNOSIS — F4310 Post-traumatic stress disorder, unspecified: Secondary | ICD-10-CM | POA: Diagnosis not present

## 2021-08-20 DIAGNOSIS — F411 Generalized anxiety disorder: Secondary | ICD-10-CM | POA: Diagnosis not present

## 2021-08-20 DIAGNOSIS — F331 Major depressive disorder, recurrent, moderate: Secondary | ICD-10-CM | POA: Diagnosis not present

## 2021-09-02 DIAGNOSIS — F332 Major depressive disorder, recurrent severe without psychotic features: Secondary | ICD-10-CM | POA: Diagnosis not present

## 2021-09-19 DIAGNOSIS — F411 Generalized anxiety disorder: Secondary | ICD-10-CM | POA: Diagnosis not present

## 2021-09-22 DIAGNOSIS — F411 Generalized anxiety disorder: Secondary | ICD-10-CM | POA: Diagnosis not present

## 2021-09-30 DIAGNOSIS — E559 Vitamin D deficiency, unspecified: Secondary | ICD-10-CM | POA: Diagnosis not present

## 2021-09-30 DIAGNOSIS — R5383 Other fatigue: Secondary | ICD-10-CM | POA: Diagnosis not present

## 2021-09-30 DIAGNOSIS — M810 Age-related osteoporosis without current pathological fracture: Secondary | ICD-10-CM | POA: Diagnosis not present

## 2021-11-05 DIAGNOSIS — M5416 Radiculopathy, lumbar region: Secondary | ICD-10-CM | POA: Diagnosis not present

## 2021-11-07 ENCOUNTER — Other Ambulatory Visit: Payer: Self-pay | Admitting: Neurosurgery

## 2021-11-07 DIAGNOSIS — M5416 Radiculopathy, lumbar region: Secondary | ICD-10-CM

## 2021-11-07 DIAGNOSIS — F411 Generalized anxiety disorder: Secondary | ICD-10-CM | POA: Diagnosis not present

## 2021-11-07 DIAGNOSIS — F331 Major depressive disorder, recurrent, moderate: Secondary | ICD-10-CM | POA: Diagnosis not present

## 2021-11-16 ENCOUNTER — Ambulatory Visit
Admission: RE | Admit: 2021-11-16 | Discharge: 2021-11-16 | Disposition: A | Discharge status: Home / Self Care | Payer: BC Managed Care – PPO | Source: Ambulatory Visit | Attending: Neurosurgery | Admitting: Neurosurgery

## 2021-11-16 ENCOUNTER — Other Ambulatory Visit: Payer: Self-pay

## 2021-11-16 DIAGNOSIS — M5416 Radiculopathy, lumbar region: Secondary | ICD-10-CM

## 2021-11-16 DIAGNOSIS — M545 Low back pain, unspecified: Secondary | ICD-10-CM | POA: Diagnosis not present

## 2021-11-18 DIAGNOSIS — F331 Major depressive disorder, recurrent, moderate: Secondary | ICD-10-CM | POA: Diagnosis not present

## 2021-11-18 DIAGNOSIS — F411 Generalized anxiety disorder: Secondary | ICD-10-CM | POA: Diagnosis not present

## 2021-11-18 DIAGNOSIS — F4310 Post-traumatic stress disorder, unspecified: Secondary | ICD-10-CM | POA: Diagnosis not present

## 2021-11-20 DIAGNOSIS — M5416 Radiculopathy, lumbar region: Secondary | ICD-10-CM | POA: Diagnosis not present

## 2021-11-27 DIAGNOSIS — Z01419 Encounter for gynecological examination (general) (routine) without abnormal findings: Secondary | ICD-10-CM | POA: Diagnosis not present

## 2021-11-27 DIAGNOSIS — Z01411 Encounter for gynecological examination (general) (routine) with abnormal findings: Secondary | ICD-10-CM | POA: Diagnosis not present

## 2021-11-27 DIAGNOSIS — Z1231 Encounter for screening mammogram for malignant neoplasm of breast: Secondary | ICD-10-CM | POA: Diagnosis not present

## 2021-11-27 DIAGNOSIS — Z124 Encounter for screening for malignant neoplasm of cervix: Secondary | ICD-10-CM | POA: Diagnosis not present

## 2021-11-27 DIAGNOSIS — Z6830 Body mass index (BMI) 30.0-30.9, adult: Secondary | ICD-10-CM | POA: Diagnosis not present

## 2021-12-03 DIAGNOSIS — M5416 Radiculopathy, lumbar region: Secondary | ICD-10-CM | POA: Diagnosis not present

## 2021-12-08 DIAGNOSIS — M5416 Radiculopathy, lumbar region: Secondary | ICD-10-CM | POA: Diagnosis not present

## 2021-12-15 DIAGNOSIS — F322 Major depressive disorder, single episode, severe without psychotic features: Secondary | ICD-10-CM | POA: Diagnosis not present

## 2021-12-16 DIAGNOSIS — M5416 Radiculopathy, lumbar region: Secondary | ICD-10-CM | POA: Diagnosis not present

## 2021-12-18 DIAGNOSIS — M5416 Radiculopathy, lumbar region: Secondary | ICD-10-CM | POA: Diagnosis not present

## 2021-12-23 DIAGNOSIS — M5416 Radiculopathy, lumbar region: Secondary | ICD-10-CM | POA: Diagnosis not present

## 2021-12-25 DIAGNOSIS — M5416 Radiculopathy, lumbar region: Secondary | ICD-10-CM | POA: Diagnosis not present

## 2021-12-29 DIAGNOSIS — M5416 Radiculopathy, lumbar region: Secondary | ICD-10-CM | POA: Diagnosis not present

## 2022-01-01 DIAGNOSIS — M5416 Radiculopathy, lumbar region: Secondary | ICD-10-CM | POA: Diagnosis not present

## 2022-01-05 DIAGNOSIS — M5416 Radiculopathy, lumbar region: Secondary | ICD-10-CM | POA: Diagnosis not present

## 2022-01-08 DIAGNOSIS — M5416 Radiculopathy, lumbar region: Secondary | ICD-10-CM | POA: Diagnosis not present

## 2022-01-13 DIAGNOSIS — M5416 Radiculopathy, lumbar region: Secondary | ICD-10-CM | POA: Diagnosis not present

## 2022-01-15 DIAGNOSIS — M5416 Radiculopathy, lumbar region: Secondary | ICD-10-CM | POA: Diagnosis not present

## 2022-01-19 DIAGNOSIS — M5416 Radiculopathy, lumbar region: Secondary | ICD-10-CM | POA: Diagnosis not present

## 2022-01-22 DIAGNOSIS — M5416 Radiculopathy, lumbar region: Secondary | ICD-10-CM | POA: Diagnosis not present

## 2022-01-26 DIAGNOSIS — M5416 Radiculopathy, lumbar region: Secondary | ICD-10-CM | POA: Diagnosis not present

## 2022-02-02 DIAGNOSIS — M5416 Radiculopathy, lumbar region: Secondary | ICD-10-CM | POA: Diagnosis not present

## 2022-02-05 DIAGNOSIS — E039 Hypothyroidism, unspecified: Secondary | ICD-10-CM | POA: Diagnosis not present

## 2022-02-05 DIAGNOSIS — R7989 Other specified abnormal findings of blood chemistry: Secondary | ICD-10-CM | POA: Diagnosis not present

## 2022-02-05 DIAGNOSIS — M5416 Radiculopathy, lumbar region: Secondary | ICD-10-CM | POA: Diagnosis not present

## 2022-02-09 DIAGNOSIS — M5416 Radiculopathy, lumbar region: Secondary | ICD-10-CM | POA: Diagnosis not present

## 2022-02-09 DIAGNOSIS — E039 Hypothyroidism, unspecified: Secondary | ICD-10-CM | POA: Diagnosis not present

## 2022-02-10 DIAGNOSIS — F331 Major depressive disorder, recurrent, moderate: Secondary | ICD-10-CM | POA: Diagnosis not present

## 2022-02-10 DIAGNOSIS — F411 Generalized anxiety disorder: Secondary | ICD-10-CM | POA: Diagnosis not present

## 2022-02-10 DIAGNOSIS — F4310 Post-traumatic stress disorder, unspecified: Secondary | ICD-10-CM | POA: Diagnosis not present

## 2022-02-12 DIAGNOSIS — M5416 Radiculopathy, lumbar region: Secondary | ICD-10-CM | POA: Diagnosis not present

## 2022-02-16 DIAGNOSIS — M8589 Other specified disorders of bone density and structure, multiple sites: Secondary | ICD-10-CM | POA: Diagnosis not present

## 2022-02-16 DIAGNOSIS — Z780 Asymptomatic menopausal state: Secondary | ICD-10-CM | POA: Diagnosis not present

## 2022-02-23 DIAGNOSIS — H1032 Unspecified acute conjunctivitis, left eye: Secondary | ICD-10-CM | POA: Diagnosis not present

## 2022-02-23 DIAGNOSIS — H2513 Age-related nuclear cataract, bilateral: Secondary | ICD-10-CM | POA: Diagnosis not present

## 2022-02-23 DIAGNOSIS — M5416 Radiculopathy, lumbar region: Secondary | ICD-10-CM | POA: Diagnosis not present

## 2022-02-23 DIAGNOSIS — B0052 Herpesviral keratitis: Secondary | ICD-10-CM | POA: Diagnosis not present

## 2022-02-26 DIAGNOSIS — M5416 Radiculopathy, lumbar region: Secondary | ICD-10-CM | POA: Diagnosis not present

## 2022-03-02 DIAGNOSIS — M5416 Radiculopathy, lumbar region: Secondary | ICD-10-CM | POA: Diagnosis not present

## 2022-03-12 DIAGNOSIS — M5416 Radiculopathy, lumbar region: Secondary | ICD-10-CM | POA: Diagnosis not present

## 2022-03-19 DIAGNOSIS — M5416 Radiculopathy, lumbar region: Secondary | ICD-10-CM | POA: Diagnosis not present

## 2022-03-20 DIAGNOSIS — Z01818 Encounter for other preprocedural examination: Secondary | ICD-10-CM | POA: Diagnosis not present

## 2022-03-20 DIAGNOSIS — F411 Generalized anxiety disorder: Secondary | ICD-10-CM | POA: Diagnosis not present

## 2022-03-20 DIAGNOSIS — G4700 Insomnia, unspecified: Secondary | ICD-10-CM | POA: Diagnosis not present

## 2022-03-20 DIAGNOSIS — Z79899 Other long term (current) drug therapy: Secondary | ICD-10-CM | POA: Diagnosis not present

## 2022-03-20 DIAGNOSIS — G8929 Other chronic pain: Secondary | ICD-10-CM | POA: Diagnosis not present

## 2022-03-20 DIAGNOSIS — F332 Major depressive disorder, recurrent severe without psychotic features: Secondary | ICD-10-CM | POA: Diagnosis not present

## 2022-03-20 DIAGNOSIS — F322 Major depressive disorder, single episode, severe without psychotic features: Secondary | ICD-10-CM | POA: Diagnosis not present

## 2022-03-20 DIAGNOSIS — F4310 Post-traumatic stress disorder, unspecified: Secondary | ICD-10-CM | POA: Diagnosis not present

## 2022-03-20 DIAGNOSIS — Z79890 Hormone replacement therapy (postmenopausal): Secondary | ICD-10-CM | POA: Diagnosis not present

## 2022-03-20 DIAGNOSIS — R9431 Abnormal electrocardiogram [ECG] [EKG]: Secondary | ICD-10-CM | POA: Diagnosis not present

## 2022-03-20 DIAGNOSIS — Z9889 Other specified postprocedural states: Secondary | ICD-10-CM | POA: Diagnosis not present

## 2022-03-20 DIAGNOSIS — M810 Age-related osteoporosis without current pathological fracture: Secondary | ICD-10-CM | POA: Diagnosis not present

## 2022-03-20 DIAGNOSIS — N183 Chronic kidney disease, stage 3 unspecified: Secondary | ICD-10-CM | POA: Diagnosis not present

## 2022-03-20 DIAGNOSIS — E039 Hypothyroidism, unspecified: Secondary | ICD-10-CM | POA: Diagnosis not present

## 2022-03-20 DIAGNOSIS — M1990 Unspecified osteoarthritis, unspecified site: Secondary | ICD-10-CM | POA: Diagnosis not present

## 2022-03-20 DIAGNOSIS — Z9049 Acquired absence of other specified parts of digestive tract: Secondary | ICD-10-CM | POA: Diagnosis not present

## 2022-03-20 DIAGNOSIS — Z9089 Acquired absence of other organs: Secondary | ICD-10-CM | POA: Diagnosis not present

## 2022-03-23 DIAGNOSIS — M5416 Radiculopathy, lumbar region: Secondary | ICD-10-CM | POA: Diagnosis not present

## 2022-03-24 DIAGNOSIS — F331 Major depressive disorder, recurrent, moderate: Secondary | ICD-10-CM | POA: Diagnosis not present

## 2022-03-24 DIAGNOSIS — F4310 Post-traumatic stress disorder, unspecified: Secondary | ICD-10-CM | POA: Diagnosis not present

## 2022-03-24 DIAGNOSIS — M810 Age-related osteoporosis without current pathological fracture: Secondary | ICD-10-CM | POA: Diagnosis not present

## 2022-03-24 DIAGNOSIS — R5383 Other fatigue: Secondary | ICD-10-CM | POA: Diagnosis not present

## 2022-03-24 DIAGNOSIS — E559 Vitamin D deficiency, unspecified: Secondary | ICD-10-CM | POA: Diagnosis not present

## 2022-03-24 DIAGNOSIS — F411 Generalized anxiety disorder: Secondary | ICD-10-CM | POA: Diagnosis not present

## 2022-05-11 DIAGNOSIS — F607 Dependent personality disorder: Secondary | ICD-10-CM | POA: Diagnosis not present

## 2022-05-11 DIAGNOSIS — F411 Generalized anxiety disorder: Secondary | ICD-10-CM | POA: Diagnosis not present

## 2022-05-11 DIAGNOSIS — E039 Hypothyroidism, unspecified: Secondary | ICD-10-CM | POA: Diagnosis not present

## 2022-05-11 DIAGNOSIS — F332 Major depressive disorder, recurrent severe without psychotic features: Secondary | ICD-10-CM | POA: Diagnosis not present

## 2022-05-11 DIAGNOSIS — Z818 Family history of other mental and behavioral disorders: Secondary | ICD-10-CM | POA: Diagnosis not present

## 2022-05-11 DIAGNOSIS — M1990 Unspecified osteoarthritis, unspecified site: Secondary | ICD-10-CM | POA: Diagnosis not present

## 2022-05-11 DIAGNOSIS — F322 Major depressive disorder, single episode, severe without psychotic features: Secondary | ICD-10-CM | POA: Diagnosis not present

## 2022-07-06 DIAGNOSIS — F411 Generalized anxiety disorder: Secondary | ICD-10-CM | POA: Diagnosis not present

## 2022-07-06 DIAGNOSIS — F322 Major depressive disorder, single episode, severe without psychotic features: Secondary | ICD-10-CM | POA: Diagnosis not present

## 2022-07-06 DIAGNOSIS — M1990 Unspecified osteoarthritis, unspecified site: Secondary | ICD-10-CM | POA: Diagnosis not present

## 2022-07-06 DIAGNOSIS — E039 Hypothyroidism, unspecified: Secondary | ICD-10-CM | POA: Diagnosis not present

## 2022-07-07 DIAGNOSIS — F331 Major depressive disorder, recurrent, moderate: Secondary | ICD-10-CM | POA: Diagnosis not present

## 2022-07-07 DIAGNOSIS — F411 Generalized anxiety disorder: Secondary | ICD-10-CM | POA: Diagnosis not present

## 2022-07-07 DIAGNOSIS — F4310 Post-traumatic stress disorder, unspecified: Secondary | ICD-10-CM | POA: Diagnosis not present

## 2022-08-04 DIAGNOSIS — H524 Presbyopia: Secondary | ICD-10-CM | POA: Diagnosis not present

## 2022-08-04 DIAGNOSIS — H2513 Age-related nuclear cataract, bilateral: Secondary | ICD-10-CM | POA: Diagnosis not present

## 2022-08-31 DIAGNOSIS — E039 Hypothyroidism, unspecified: Secondary | ICD-10-CM | POA: Diagnosis not present

## 2022-08-31 DIAGNOSIS — Z818 Family history of other mental and behavioral disorders: Secondary | ICD-10-CM | POA: Diagnosis not present

## 2022-08-31 DIAGNOSIS — Z79890 Hormone replacement therapy (postmenopausal): Secondary | ICD-10-CM | POA: Diagnosis not present

## 2022-08-31 DIAGNOSIS — M1990 Unspecified osteoarthritis, unspecified site: Secondary | ICD-10-CM | POA: Diagnosis not present

## 2022-08-31 DIAGNOSIS — F4310 Post-traumatic stress disorder, unspecified: Secondary | ICD-10-CM | POA: Diagnosis not present

## 2022-08-31 DIAGNOSIS — F411 Generalized anxiety disorder: Secondary | ICD-10-CM | POA: Diagnosis not present

## 2022-08-31 DIAGNOSIS — F322 Major depressive disorder, single episode, severe without psychotic features: Secondary | ICD-10-CM | POA: Diagnosis not present

## 2022-08-31 DIAGNOSIS — Z79899 Other long term (current) drug therapy: Secondary | ICD-10-CM | POA: Diagnosis not present

## 2022-09-09 DIAGNOSIS — E039 Hypothyroidism, unspecified: Secondary | ICD-10-CM | POA: Diagnosis not present

## 2022-09-16 DIAGNOSIS — E039 Hypothyroidism, unspecified: Secondary | ICD-10-CM | POA: Diagnosis not present

## 2022-09-16 DIAGNOSIS — E669 Obesity, unspecified: Secondary | ICD-10-CM | POA: Diagnosis not present

## 2022-10-30 DIAGNOSIS — F411 Generalized anxiety disorder: Secondary | ICD-10-CM | POA: Diagnosis not present

## 2022-10-30 DIAGNOSIS — F322 Major depressive disorder, single episode, severe without psychotic features: Secondary | ICD-10-CM | POA: Diagnosis not present

## 2022-10-30 DIAGNOSIS — Z9151 Personal history of suicidal behavior: Secondary | ICD-10-CM | POA: Diagnosis not present

## 2022-10-30 DIAGNOSIS — Z79899 Other long term (current) drug therapy: Secondary | ICD-10-CM | POA: Diagnosis not present

## 2022-10-30 DIAGNOSIS — E039 Hypothyroidism, unspecified: Secondary | ICD-10-CM | POA: Diagnosis not present

## 2022-11-09 DIAGNOSIS — E039 Hypothyroidism, unspecified: Secondary | ICD-10-CM | POA: Diagnosis not present

## 2022-11-09 DIAGNOSIS — E669 Obesity, unspecified: Secondary | ICD-10-CM | POA: Diagnosis not present

## 2022-11-16 DIAGNOSIS — E039 Hypothyroidism, unspecified: Secondary | ICD-10-CM | POA: Diagnosis not present

## 2022-11-16 DIAGNOSIS — E669 Obesity, unspecified: Secondary | ICD-10-CM | POA: Diagnosis not present

## 2022-11-17 ENCOUNTER — Other Ambulatory Visit (HOSPITAL_COMMUNITY): Payer: Self-pay

## 2022-11-17 MED ORDER — ZEPBOUND 2.5 MG/0.5ML ~~LOC~~ SOAJ
2.5000 mg | SUBCUTANEOUS | 0 refills | Status: DC
  Filled 2022-11-17 (×2): qty 2, 28d supply, fill #0

## 2022-12-09 ENCOUNTER — Other Ambulatory Visit (HOSPITAL_COMMUNITY): Payer: Self-pay

## 2022-12-09 MED ORDER — ZEPBOUND 2.5 MG/0.5ML ~~LOC~~ SOAJ
2.5000 mg | SUBCUTANEOUS | 0 refills | Status: AC
  Filled 2022-12-09: qty 2, 28d supply, fill #0

## 2022-12-11 ENCOUNTER — Other Ambulatory Visit (HOSPITAL_COMMUNITY): Payer: Self-pay

## 2022-12-14 ENCOUNTER — Other Ambulatory Visit (HOSPITAL_COMMUNITY): Payer: Self-pay

## 2022-12-23 DIAGNOSIS — E663 Overweight: Secondary | ICD-10-CM | POA: Diagnosis not present

## 2022-12-23 DIAGNOSIS — E039 Hypothyroidism, unspecified: Secondary | ICD-10-CM | POA: Diagnosis not present

## 2022-12-23 DIAGNOSIS — Z79890 Hormone replacement therapy (postmenopausal): Secondary | ICD-10-CM | POA: Diagnosis not present

## 2022-12-23 DIAGNOSIS — M810 Age-related osteoporosis without current pathological fracture: Secondary | ICD-10-CM | POA: Diagnosis not present

## 2022-12-23 DIAGNOSIS — F322 Major depressive disorder, single episode, severe without psychotic features: Secondary | ICD-10-CM | POA: Diagnosis not present

## 2022-12-23 DIAGNOSIS — F411 Generalized anxiety disorder: Secondary | ICD-10-CM | POA: Diagnosis not present

## 2022-12-23 DIAGNOSIS — N183 Chronic kidney disease, stage 3 unspecified: Secondary | ICD-10-CM | POA: Diagnosis not present

## 2022-12-23 DIAGNOSIS — Z6829 Body mass index (BMI) 29.0-29.9, adult: Secondary | ICD-10-CM | POA: Diagnosis not present

## 2022-12-23 DIAGNOSIS — Z01818 Encounter for other preprocedural examination: Secondary | ICD-10-CM | POA: Diagnosis not present

## 2022-12-24 ENCOUNTER — Other Ambulatory Visit (HOSPITAL_COMMUNITY): Payer: Self-pay

## 2022-12-24 MED ORDER — ZEPBOUND 5 MG/0.5ML ~~LOC~~ SOAJ
2.5000 mg | SUBCUTANEOUS | 1 refills | Status: AC
  Filled 2022-12-24: qty 2, 28d supply, fill #0
  Filled 2023-01-16: qty 2, 28d supply, fill #1

## 2022-12-24 MED ORDER — ZEPBOUND 5 MG/0.5ML ~~LOC~~ SOAJ
5.0000 mg | SUBCUTANEOUS | 1 refills | Status: AC
  Filled 2022-12-24: qty 2, 28d supply, fill #0

## 2023-01-05 DIAGNOSIS — F331 Major depressive disorder, recurrent, moderate: Secondary | ICD-10-CM | POA: Diagnosis not present

## 2023-01-05 DIAGNOSIS — F4310 Post-traumatic stress disorder, unspecified: Secondary | ICD-10-CM | POA: Diagnosis not present

## 2023-01-05 DIAGNOSIS — F411 Generalized anxiety disorder: Secondary | ICD-10-CM | POA: Diagnosis not present

## 2023-01-08 DIAGNOSIS — Z79899 Other long term (current) drug therapy: Secondary | ICD-10-CM | POA: Diagnosis not present

## 2023-01-08 DIAGNOSIS — F322 Major depressive disorder, single episode, severe without psychotic features: Secondary | ICD-10-CM | POA: Diagnosis not present

## 2023-01-08 DIAGNOSIS — M1990 Unspecified osteoarthritis, unspecified site: Secondary | ICD-10-CM | POA: Diagnosis not present

## 2023-01-08 DIAGNOSIS — E039 Hypothyroidism, unspecified: Secondary | ICD-10-CM | POA: Diagnosis not present

## 2023-01-08 DIAGNOSIS — Z79890 Hormone replacement therapy (postmenopausal): Secondary | ICD-10-CM | POA: Diagnosis not present

## 2023-01-08 DIAGNOSIS — F333 Major depressive disorder, recurrent, severe with psychotic symptoms: Secondary | ICD-10-CM | POA: Diagnosis not present

## 2023-01-18 ENCOUNTER — Other Ambulatory Visit (HOSPITAL_COMMUNITY): Payer: Self-pay

## 2023-01-21 ENCOUNTER — Other Ambulatory Visit (HOSPITAL_COMMUNITY): Payer: Self-pay

## 2023-02-01 ENCOUNTER — Encounter: Payer: Self-pay | Admitting: *Deleted

## 2023-02-03 DIAGNOSIS — E039 Hypothyroidism, unspecified: Secondary | ICD-10-CM | POA: Diagnosis not present

## 2023-02-16 ENCOUNTER — Other Ambulatory Visit (HOSPITAL_COMMUNITY): Payer: Self-pay

## 2023-02-16 MED ORDER — ZEPBOUND 7.5 MG/0.5ML ~~LOC~~ SOAJ
7.5000 mg | SUBCUTANEOUS | 0 refills | Status: AC
  Filled 2023-02-16: qty 2, 28d supply, fill #0

## 2023-02-16 MED ORDER — ZEPBOUND 7.5 MG/0.5ML ~~LOC~~ SOAJ
7.5000 mg | SUBCUTANEOUS | 0 refills | Status: DC
  Filled 2023-02-16: qty 2, 28d supply, fill #0

## 2023-02-17 ENCOUNTER — Other Ambulatory Visit (HOSPITAL_COMMUNITY): Payer: Self-pay

## 2023-02-18 DIAGNOSIS — Z1231 Encounter for screening mammogram for malignant neoplasm of breast: Secondary | ICD-10-CM | POA: Diagnosis not present

## 2023-02-18 DIAGNOSIS — Z01419 Encounter for gynecological examination (general) (routine) without abnormal findings: Secondary | ICD-10-CM | POA: Diagnosis not present

## 2023-02-18 DIAGNOSIS — Z124 Encounter for screening for malignant neoplasm of cervix: Secondary | ICD-10-CM | POA: Diagnosis not present

## 2023-02-24 ENCOUNTER — Other Ambulatory Visit (HOSPITAL_COMMUNITY): Payer: Self-pay

## 2023-02-24 DIAGNOSIS — R42 Dizziness and giddiness: Secondary | ICD-10-CM | POA: Diagnosis not present

## 2023-02-24 MED ORDER — ZEPBOUND 7.5 MG/0.5ML ~~LOC~~ SOAJ
7.5000 mg | SUBCUTANEOUS | 0 refills | Status: DC
  Filled 2023-02-24 – 2023-03-17 (×2): qty 2, 28d supply, fill #0

## 2023-02-25 DIAGNOSIS — E559 Vitamin D deficiency, unspecified: Secondary | ICD-10-CM | POA: Diagnosis not present

## 2023-02-25 DIAGNOSIS — R5383 Other fatigue: Secondary | ICD-10-CM | POA: Diagnosis not present

## 2023-02-25 DIAGNOSIS — M810 Age-related osteoporosis without current pathological fracture: Secondary | ICD-10-CM | POA: Diagnosis not present

## 2023-02-25 DIAGNOSIS — Z13820 Encounter for screening for osteoporosis: Secondary | ICD-10-CM | POA: Diagnosis not present

## 2023-03-02 DIAGNOSIS — F332 Major depressive disorder, recurrent severe without psychotic features: Secondary | ICD-10-CM | POA: Diagnosis not present

## 2023-03-02 DIAGNOSIS — F322 Major depressive disorder, single episode, severe without psychotic features: Secondary | ICD-10-CM | POA: Diagnosis not present

## 2023-03-03 ENCOUNTER — Other Ambulatory Visit (HOSPITAL_COMMUNITY): Payer: Self-pay

## 2023-03-05 ENCOUNTER — Other Ambulatory Visit (HOSPITAL_COMMUNITY): Payer: Self-pay

## 2023-03-16 DIAGNOSIS — M810 Age-related osteoporosis without current pathological fracture: Secondary | ICD-10-CM | POA: Diagnosis not present

## 2023-03-17 ENCOUNTER — Other Ambulatory Visit (HOSPITAL_BASED_OUTPATIENT_CLINIC_OR_DEPARTMENT_OTHER): Payer: Self-pay

## 2023-03-17 ENCOUNTER — Other Ambulatory Visit (HOSPITAL_COMMUNITY): Payer: Self-pay

## 2023-03-17 DIAGNOSIS — E039 Hypothyroidism, unspecified: Secondary | ICD-10-CM | POA: Diagnosis not present

## 2023-03-17 DIAGNOSIS — E669 Obesity, unspecified: Secondary | ICD-10-CM | POA: Diagnosis not present

## 2023-03-17 MED ORDER — ZEPBOUND 7.5 MG/0.5ML ~~LOC~~ SOAJ
7.5000 mg | SUBCUTANEOUS | 5 refills | Status: AC
  Filled 2023-03-17 – 2023-07-21 (×3): qty 2, 28d supply, fill #0

## 2023-03-17 MED ORDER — WEGOVY 1 MG/0.5ML ~~LOC~~ SOAJ: SUBCUTANEOUS | 5 refills | Status: AC

## 2023-03-17 MED ORDER — WEGOVY 1 MG/0.5ML ~~LOC~~ SOAJ
SUBCUTANEOUS | 5 refills | Status: AC
  Filled 2023-03-17: qty 2, 28d supply, fill #0

## 2023-03-23 ENCOUNTER — Other Ambulatory Visit (HOSPITAL_BASED_OUTPATIENT_CLINIC_OR_DEPARTMENT_OTHER): Payer: Self-pay

## 2023-03-23 MED ORDER — ZEPBOUND 7.5 MG/0.5ML ~~LOC~~ SOAJ
7.5000 mg | SUBCUTANEOUS | 5 refills | Status: AC
  Filled 2023-03-23: qty 2, 28d supply, fill #0
  Filled 2023-04-28: qty 2, 28d supply, fill #1
  Filled 2023-05-26: qty 2, 28d supply, fill #2
  Filled 2023-06-23: qty 2, 28d supply, fill #0
  Filled 2023-06-23: qty 2, 28d supply, fill #3
  Filled 2023-08-24: qty 2, 28d supply, fill #1
  Filled 2023-11-10: qty 2, 28d supply, fill #2

## 2023-03-25 DIAGNOSIS — R109 Unspecified abdominal pain: Secondary | ICD-10-CM | POA: Diagnosis not present

## 2023-03-25 DIAGNOSIS — K5900 Constipation, unspecified: Secondary | ICD-10-CM | POA: Diagnosis not present

## 2023-04-06 DIAGNOSIS — F4310 Post-traumatic stress disorder, unspecified: Secondary | ICD-10-CM | POA: Diagnosis not present

## 2023-04-06 DIAGNOSIS — F411 Generalized anxiety disorder: Secondary | ICD-10-CM | POA: Diagnosis not present

## 2023-04-06 DIAGNOSIS — F331 Major depressive disorder, recurrent, moderate: Secondary | ICD-10-CM | POA: Diagnosis not present

## 2023-04-15 DIAGNOSIS — F331 Major depressive disorder, recurrent, moderate: Secondary | ICD-10-CM | POA: Diagnosis not present

## 2023-04-15 DIAGNOSIS — F4310 Post-traumatic stress disorder, unspecified: Secondary | ICD-10-CM | POA: Diagnosis not present

## 2023-04-15 DIAGNOSIS — F411 Generalized anxiety disorder: Secondary | ICD-10-CM | POA: Diagnosis not present

## 2023-04-26 ENCOUNTER — Telehealth: Payer: Self-pay | Admitting: Pharmacy Technician

## 2023-04-26 ENCOUNTER — Other Ambulatory Visit: Payer: Self-pay

## 2023-04-26 NOTE — Telephone Encounter (Signed)
Auth Submission: NO AUTH NEEDED Site of care: Site of care: [facility] Payer: BCBS Medication & CPT/J Code(s) submitted: Reclast (Zolendronic acid) [id number] Route of submission (phone, fax, portal):  Phone # Fax # Auth type: Buy/Bill Units/visits requested: 1 Reference number:  Approval from: 04/26/23 to 10/19/23

## 2023-04-27 DIAGNOSIS — Z79890 Hormone replacement therapy (postmenopausal): Secondary | ICD-10-CM | POA: Diagnosis not present

## 2023-04-27 DIAGNOSIS — F333 Major depressive disorder, recurrent, severe with psychotic symptoms: Secondary | ICD-10-CM | POA: Diagnosis not present

## 2023-04-27 DIAGNOSIS — E039 Hypothyroidism, unspecified: Secondary | ICD-10-CM | POA: Diagnosis not present

## 2023-04-27 DIAGNOSIS — F322 Major depressive disorder, single episode, severe without psychotic features: Secondary | ICD-10-CM | POA: Diagnosis not present

## 2023-04-27 DIAGNOSIS — Z79899 Other long term (current) drug therapy: Secondary | ICD-10-CM | POA: Diagnosis not present

## 2023-04-28 ENCOUNTER — Other Ambulatory Visit (HOSPITAL_COMMUNITY): Payer: Self-pay

## 2023-04-28 ENCOUNTER — Other Ambulatory Visit (HOSPITAL_BASED_OUTPATIENT_CLINIC_OR_DEPARTMENT_OTHER): Payer: Self-pay

## 2023-05-13 ENCOUNTER — Ambulatory Visit: Payer: BC Managed Care – PPO

## 2023-05-17 DIAGNOSIS — E039 Hypothyroidism, unspecified: Secondary | ICD-10-CM | POA: Diagnosis not present

## 2023-05-18 ENCOUNTER — Ambulatory Visit (INDEPENDENT_AMBULATORY_CARE_PROVIDER_SITE_OTHER): Payer: BC Managed Care – PPO

## 2023-05-18 VITALS — BP 117/78 | HR 72 | Temp 97.8°F | Resp 18 | Ht 64.0 in | Wt 155.6 lb

## 2023-05-18 DIAGNOSIS — M810 Age-related osteoporosis without current pathological fracture: Secondary | ICD-10-CM

## 2023-05-18 MED ORDER — DIPHENHYDRAMINE HCL 25 MG PO CAPS
25.0000 mg | ORAL_CAPSULE | Freq: Once | ORAL | Status: AC
  Administered 2023-05-18: 25 mg via ORAL
  Filled 2023-05-18: qty 1

## 2023-05-18 MED ORDER — ACETAMINOPHEN 325 MG PO TABS
650.0000 mg | ORAL_TABLET | Freq: Once | ORAL | Status: AC
  Administered 2023-05-18: 650 mg via ORAL
  Filled 2023-05-18: qty 2

## 2023-05-18 MED ORDER — ZOLEDRONIC ACID 5 MG/100ML IV SOLN
5.0000 mg | Freq: Once | INTRAVENOUS | Status: AC
  Administered 2023-05-18: 5 mg via INTRAVENOUS
  Filled 2023-05-18: qty 100

## 2023-05-18 NOTE — Patient Instructions (Signed)

## 2023-05-18 NOTE — Progress Notes (Signed)
Diagnosis: Osteoporosis  Provider:  Chilton Greathouse MD  Procedure: IV Infusion  IV Type: Peripheral, IV Location: L Hand  Reclast (Zolendronic Acid), Dose: 5 mg  Infusion Start Time: 1435  Infusion Stop Time: 1504  Post Infusion IV Care: Observation period completed and Peripheral IV Discontinued  Discharge: Condition: Good, Destination: Home . AVS Provided  Performed by:  Rico Ala, LPN

## 2023-05-26 ENCOUNTER — Other Ambulatory Visit (HOSPITAL_COMMUNITY): Payer: Self-pay

## 2023-06-22 DIAGNOSIS — F332 Major depressive disorder, recurrent severe without psychotic features: Secondary | ICD-10-CM | POA: Diagnosis not present

## 2023-06-23 ENCOUNTER — Other Ambulatory Visit (HOSPITAL_COMMUNITY): Payer: Self-pay

## 2023-06-24 DIAGNOSIS — F331 Major depressive disorder, recurrent, moderate: Secondary | ICD-10-CM | POA: Diagnosis not present

## 2023-06-24 DIAGNOSIS — F411 Generalized anxiety disorder: Secondary | ICD-10-CM | POA: Diagnosis not present

## 2023-06-24 DIAGNOSIS — F4310 Post-traumatic stress disorder, unspecified: Secondary | ICD-10-CM | POA: Diagnosis not present

## 2023-07-21 ENCOUNTER — Other Ambulatory Visit: Payer: Self-pay

## 2023-07-21 ENCOUNTER — Other Ambulatory Visit (HOSPITAL_COMMUNITY): Payer: Self-pay

## 2023-08-05 DIAGNOSIS — H2513 Age-related nuclear cataract, bilateral: Secondary | ICD-10-CM | POA: Diagnosis not present

## 2023-08-05 DIAGNOSIS — H5203 Hypermetropia, bilateral: Secondary | ICD-10-CM | POA: Diagnosis not present

## 2023-08-17 DIAGNOSIS — F411 Generalized anxiety disorder: Secondary | ICD-10-CM | POA: Diagnosis not present

## 2023-08-17 DIAGNOSIS — Z79899 Other long term (current) drug therapy: Secondary | ICD-10-CM | POA: Diagnosis not present

## 2023-08-17 DIAGNOSIS — F322 Major depressive disorder, single episode, severe without psychotic features: Secondary | ICD-10-CM | POA: Diagnosis not present

## 2023-08-17 DIAGNOSIS — N183 Chronic kidney disease, stage 3 unspecified: Secondary | ICD-10-CM | POA: Diagnosis not present

## 2023-08-17 DIAGNOSIS — R9431 Abnormal electrocardiogram [ECG] [EKG]: Secondary | ICD-10-CM | POA: Diagnosis not present

## 2023-08-17 DIAGNOSIS — F333 Major depressive disorder, recurrent, severe with psychotic symptoms: Secondary | ICD-10-CM | POA: Diagnosis not present

## 2023-08-17 DIAGNOSIS — E039 Hypothyroidism, unspecified: Secondary | ICD-10-CM | POA: Diagnosis not present

## 2023-08-17 DIAGNOSIS — Z01818 Encounter for other preprocedural examination: Secondary | ICD-10-CM | POA: Diagnosis not present

## 2023-08-17 DIAGNOSIS — Z79890 Hormone replacement therapy (postmenopausal): Secondary | ICD-10-CM | POA: Diagnosis not present

## 2023-08-24 ENCOUNTER — Other Ambulatory Visit (HOSPITAL_COMMUNITY): Payer: Self-pay

## 2023-08-25 DIAGNOSIS — F331 Major depressive disorder, recurrent, moderate: Secondary | ICD-10-CM | POA: Diagnosis not present

## 2023-08-25 DIAGNOSIS — E039 Hypothyroidism, unspecified: Secondary | ICD-10-CM | POA: Diagnosis not present

## 2023-08-25 DIAGNOSIS — F4310 Post-traumatic stress disorder, unspecified: Secondary | ICD-10-CM | POA: Diagnosis not present

## 2023-08-25 DIAGNOSIS — F411 Generalized anxiety disorder: Secondary | ICD-10-CM | POA: Diagnosis not present

## 2023-09-01 ENCOUNTER — Other Ambulatory Visit (HOSPITAL_BASED_OUTPATIENT_CLINIC_OR_DEPARTMENT_OTHER): Payer: Self-pay

## 2023-09-01 DIAGNOSIS — E669 Obesity, unspecified: Secondary | ICD-10-CM | POA: Diagnosis not present

## 2023-09-01 DIAGNOSIS — E039 Hypothyroidism, unspecified: Secondary | ICD-10-CM | POA: Diagnosis not present

## 2023-09-01 MED ORDER — ZEPBOUND 7.5 MG/0.5ML ~~LOC~~ SOAJ
7.5000 mg | SUBCUTANEOUS | 5 refills | Status: AC
  Filled 2023-09-01 – 2023-09-20 (×3): qty 2, 28d supply, fill #0
  Filled 2023-10-15: qty 2, 28d supply, fill #1
  Filled 2023-11-10 – 2023-12-08 (×2): qty 2, 28d supply, fill #2
  Filled 2024-01-10: qty 2, 28d supply, fill #3

## 2023-09-06 DIAGNOSIS — R55 Syncope and collapse: Secondary | ICD-10-CM | POA: Diagnosis not present

## 2023-09-06 DIAGNOSIS — Z9181 History of falling: Secondary | ICD-10-CM | POA: Diagnosis not present

## 2023-09-11 ENCOUNTER — Other Ambulatory Visit (HOSPITAL_BASED_OUTPATIENT_CLINIC_OR_DEPARTMENT_OTHER): Payer: Self-pay

## 2023-09-20 ENCOUNTER — Other Ambulatory Visit (HOSPITAL_COMMUNITY): Payer: Self-pay

## 2023-10-05 DIAGNOSIS — F332 Major depressive disorder, recurrent severe without psychotic features: Secondary | ICD-10-CM | POA: Diagnosis not present

## 2023-10-05 DIAGNOSIS — F322 Major depressive disorder, single episode, severe without psychotic features: Secondary | ICD-10-CM | POA: Diagnosis not present

## 2023-10-15 ENCOUNTER — Other Ambulatory Visit (HOSPITAL_COMMUNITY): Payer: Self-pay

## 2023-11-10 ENCOUNTER — Other Ambulatory Visit (HOSPITAL_COMMUNITY): Payer: Self-pay

## 2023-11-24 DIAGNOSIS — F4310 Post-traumatic stress disorder, unspecified: Secondary | ICD-10-CM | POA: Diagnosis not present

## 2023-11-24 DIAGNOSIS — F331 Major depressive disorder, recurrent, moderate: Secondary | ICD-10-CM | POA: Diagnosis not present

## 2023-11-24 DIAGNOSIS — F411 Generalized anxiety disorder: Secondary | ICD-10-CM | POA: Diagnosis not present

## 2023-11-30 DIAGNOSIS — F332 Major depressive disorder, recurrent severe without psychotic features: Secondary | ICD-10-CM | POA: Diagnosis not present

## 2023-12-08 ENCOUNTER — Other Ambulatory Visit (HOSPITAL_COMMUNITY): Payer: Self-pay

## 2023-12-13 DIAGNOSIS — F321 Major depressive disorder, single episode, moderate: Secondary | ICD-10-CM | POA: Diagnosis not present

## 2023-12-23 DIAGNOSIS — F4310 Post-traumatic stress disorder, unspecified: Secondary | ICD-10-CM | POA: Diagnosis not present

## 2023-12-23 DIAGNOSIS — F411 Generalized anxiety disorder: Secondary | ICD-10-CM | POA: Diagnosis not present

## 2023-12-23 DIAGNOSIS — F331 Major depressive disorder, recurrent, moderate: Secondary | ICD-10-CM | POA: Diagnosis not present

## 2024-01-10 ENCOUNTER — Other Ambulatory Visit (HOSPITAL_COMMUNITY): Payer: Self-pay

## 2024-01-17 DIAGNOSIS — E039 Hypothyroidism, unspecified: Secondary | ICD-10-CM | POA: Diagnosis not present

## 2024-01-19 DIAGNOSIS — F321 Major depressive disorder, single episode, moderate: Secondary | ICD-10-CM | POA: Diagnosis not present

## 2024-01-24 DIAGNOSIS — E669 Obesity, unspecified: Secondary | ICD-10-CM | POA: Diagnosis not present

## 2024-01-24 DIAGNOSIS — E039 Hypothyroidism, unspecified: Secondary | ICD-10-CM | POA: Diagnosis not present

## 2024-01-25 DIAGNOSIS — Z79899 Other long term (current) drug therapy: Secondary | ICD-10-CM | POA: Diagnosis not present

## 2024-01-25 DIAGNOSIS — F411 Generalized anxiety disorder: Secondary | ICD-10-CM | POA: Diagnosis not present

## 2024-01-25 DIAGNOSIS — F333 Major depressive disorder, recurrent, severe with psychotic symptoms: Secondary | ICD-10-CM | POA: Diagnosis not present

## 2024-01-25 DIAGNOSIS — F4310 Post-traumatic stress disorder, unspecified: Secondary | ICD-10-CM | POA: Diagnosis not present

## 2024-01-25 DIAGNOSIS — F322 Major depressive disorder, single episode, severe without psychotic features: Secondary | ICD-10-CM | POA: Diagnosis not present

## 2024-01-25 DIAGNOSIS — F607 Dependent personality disorder: Secondary | ICD-10-CM | POA: Diagnosis not present

## 2024-01-25 DIAGNOSIS — E039 Hypothyroidism, unspecified: Secondary | ICD-10-CM | POA: Diagnosis not present

## 2024-01-31 DIAGNOSIS — R197 Diarrhea, unspecified: Secondary | ICD-10-CM | POA: Diagnosis not present

## 2024-02-02 ENCOUNTER — Other Ambulatory Visit (HOSPITAL_COMMUNITY): Payer: Self-pay

## 2024-02-03 ENCOUNTER — Other Ambulatory Visit (HOSPITAL_COMMUNITY): Payer: Self-pay

## 2024-02-03 MED ORDER — ZEPBOUND 5 MG/0.5ML ~~LOC~~ SOAJ
5.0000 mg | SUBCUTANEOUS | 11 refills | Status: AC
  Filled 2024-02-03: qty 2, 28d supply, fill #0

## 2024-02-16 DIAGNOSIS — F321 Major depressive disorder, single episode, moderate: Secondary | ICD-10-CM | POA: Diagnosis not present

## 2024-02-23 DIAGNOSIS — S93601A Unspecified sprain of right foot, initial encounter: Secondary | ICD-10-CM | POA: Diagnosis not present

## 2024-02-24 DIAGNOSIS — F411 Generalized anxiety disorder: Secondary | ICD-10-CM | POA: Diagnosis not present

## 2024-02-24 DIAGNOSIS — F331 Major depressive disorder, recurrent, moderate: Secondary | ICD-10-CM | POA: Diagnosis not present

## 2024-02-24 DIAGNOSIS — F4310 Post-traumatic stress disorder, unspecified: Secondary | ICD-10-CM | POA: Diagnosis not present

## 2024-03-01 DIAGNOSIS — F321 Major depressive disorder, single episode, moderate: Secondary | ICD-10-CM | POA: Diagnosis not present

## 2024-03-03 ENCOUNTER — Other Ambulatory Visit (HOSPITAL_COMMUNITY): Payer: Self-pay

## 2024-03-03 MED ORDER — ZEPBOUND 7.5 MG/0.5ML ~~LOC~~ SOAJ
7.5000 mg | SUBCUTANEOUS | 11 refills | Status: AC
  Filled 2024-03-03: qty 2, 28d supply, fill #0
  Filled 2024-03-26: qty 2, 28d supply, fill #1
  Filled 2024-04-28: qty 2, 28d supply, fill #2
  Filled 2024-05-17 – 2024-05-19 (×2): qty 2, 28d supply, fill #3
  Filled 2024-06-21: qty 2, 28d supply, fill #4

## 2024-03-06 DIAGNOSIS — E039 Hypothyroidism, unspecified: Secondary | ICD-10-CM | POA: Diagnosis not present

## 2024-03-15 DIAGNOSIS — F321 Major depressive disorder, single episode, moderate: Secondary | ICD-10-CM | POA: Diagnosis not present

## 2024-03-21 DIAGNOSIS — F332 Major depressive disorder, recurrent severe without psychotic features: Secondary | ICD-10-CM | POA: Diagnosis not present

## 2024-03-27 ENCOUNTER — Other Ambulatory Visit (HOSPITAL_COMMUNITY): Payer: Self-pay

## 2024-03-29 DIAGNOSIS — F321 Major depressive disorder, single episode, moderate: Secondary | ICD-10-CM | POA: Diagnosis not present

## 2024-04-05 DIAGNOSIS — Z1331 Encounter for screening for depression: Secondary | ICD-10-CM | POA: Diagnosis not present

## 2024-04-05 DIAGNOSIS — Z01419 Encounter for gynecological examination (general) (routine) without abnormal findings: Secondary | ICD-10-CM | POA: Diagnosis not present

## 2024-04-05 DIAGNOSIS — Z1231 Encounter for screening mammogram for malignant neoplasm of breast: Secondary | ICD-10-CM | POA: Diagnosis not present

## 2024-04-06 DIAGNOSIS — R5383 Other fatigue: Secondary | ICD-10-CM | POA: Diagnosis not present

## 2024-04-06 DIAGNOSIS — E559 Vitamin D deficiency, unspecified: Secondary | ICD-10-CM | POA: Diagnosis not present

## 2024-04-06 DIAGNOSIS — M810 Age-related osteoporosis without current pathological fracture: Secondary | ICD-10-CM | POA: Diagnosis not present

## 2024-04-07 ENCOUNTER — Other Ambulatory Visit: Payer: Self-pay | Admitting: Sports Medicine

## 2024-04-18 ENCOUNTER — Telehealth: Payer: Self-pay

## 2024-04-18 NOTE — Telephone Encounter (Signed)
 Auth Submission: NO AUTH NEEDED Site of care: Site of care: [facility] Payer: BCBS commercial Medication & CPT/J Code(s) submitted: Reclast  (Zolendronic acid) [id number] Diagnosis Code:  Route of submission (phone, fax, portal):  Phone # Fax # Auth type: Buy/Bill PB Units/visits requested: 5mg  x 1 dose Reference number:  Approval from: 04/18/24 to 10/18/24

## 2024-04-26 DIAGNOSIS — F321 Major depressive disorder, single episode, moderate: Secondary | ICD-10-CM | POA: Diagnosis not present

## 2024-04-28 ENCOUNTER — Other Ambulatory Visit (HOSPITAL_COMMUNITY): Payer: Self-pay

## 2024-05-17 ENCOUNTER — Other Ambulatory Visit (HOSPITAL_COMMUNITY): Payer: Self-pay

## 2024-05-18 ENCOUNTER — Ambulatory Visit

## 2024-05-18 VITALS — BP 133/86 | HR 83 | Temp 97.6°F | Resp 16 | Ht 64.0 in | Wt 130.4 lb

## 2024-05-18 DIAGNOSIS — M810 Age-related osteoporosis without current pathological fracture: Secondary | ICD-10-CM

## 2024-05-18 MED ORDER — ZOLEDRONIC ACID 5 MG/100ML IV SOLN
5.0000 mg | Freq: Once | INTRAVENOUS | Status: AC
Start: 2024-05-18 — End: 2024-05-18
  Administered 2024-05-18: 5 mg via INTRAVENOUS
  Filled 2024-05-18: qty 100

## 2024-05-18 MED ORDER — ACETAMINOPHEN 325 MG PO TABS
650.0000 mg | ORAL_TABLET | Freq: Once | ORAL | Status: AC
  Administered 2024-05-18: 650 mg via ORAL
  Filled 2024-05-18: qty 2

## 2024-05-18 MED ORDER — DIPHENHYDRAMINE HCL 25 MG PO CAPS
25.0000 mg | ORAL_CAPSULE | Freq: Once | ORAL | Status: AC
Start: 2024-05-18 — End: 2024-05-18
  Administered 2024-05-18: 25 mg via ORAL
  Filled 2024-05-18: qty 1

## 2024-05-18 NOTE — Progress Notes (Signed)
 Diagnosis: Osteoporosis  Provider:  Lonna Coder MD  Procedure: IV Infusion  IV Type: Peripheral, IV Location: R Antecubital  Reclast  (Zolendronic Acid), Dose: 5 mg  Infusion Start Time: 1043  Infusion Stop Time: 1115  Post Infusion IV Care: Peripheral IV Discontinued  Discharge: Condition: Good, Destination: Home . AVS Declined  Performed by:  Maximiano JONELLE Pouch, LPN

## 2024-05-19 ENCOUNTER — Other Ambulatory Visit (HOSPITAL_COMMUNITY): Payer: Self-pay

## 2024-05-23 DIAGNOSIS — F322 Major depressive disorder, single episode, severe without psychotic features: Secondary | ICD-10-CM | POA: Diagnosis not present

## 2024-05-23 DIAGNOSIS — F332 Major depressive disorder, recurrent severe without psychotic features: Secondary | ICD-10-CM | POA: Diagnosis not present

## 2024-05-23 DIAGNOSIS — Z01818 Encounter for other preprocedural examination: Secondary | ICD-10-CM | POA: Diagnosis not present

## 2024-05-23 DIAGNOSIS — E039 Hypothyroidism, unspecified: Secondary | ICD-10-CM | POA: Diagnosis not present

## 2024-05-23 DIAGNOSIS — F411 Generalized anxiety disorder: Secondary | ICD-10-CM | POA: Diagnosis not present

## 2024-05-25 DIAGNOSIS — F4310 Post-traumatic stress disorder, unspecified: Secondary | ICD-10-CM | POA: Diagnosis not present

## 2024-05-25 DIAGNOSIS — F411 Generalized anxiety disorder: Secondary | ICD-10-CM | POA: Diagnosis not present

## 2024-05-25 DIAGNOSIS — F331 Major depressive disorder, recurrent, moderate: Secondary | ICD-10-CM | POA: Diagnosis not present

## 2024-06-07 DIAGNOSIS — M545 Low back pain, unspecified: Secondary | ICD-10-CM | POA: Diagnosis not present

## 2024-06-07 DIAGNOSIS — M5442 Lumbago with sciatica, left side: Secondary | ICD-10-CM | POA: Diagnosis not present

## 2024-06-21 ENCOUNTER — Other Ambulatory Visit (HOSPITAL_COMMUNITY): Payer: Self-pay

## 2024-06-30 DIAGNOSIS — E673 Hypervitaminosis D: Secondary | ICD-10-CM | POA: Diagnosis not present

## 2024-06-30 DIAGNOSIS — M810 Age-related osteoporosis without current pathological fracture: Secondary | ICD-10-CM | POA: Diagnosis not present

## 2024-06-30 DIAGNOSIS — R7401 Elevation of levels of liver transaminase levels: Secondary | ICD-10-CM | POA: Diagnosis not present

## 2024-06-30 DIAGNOSIS — E559 Vitamin D deficiency, unspecified: Secondary | ICD-10-CM | POA: Diagnosis not present

## 2024-07-11 ENCOUNTER — Other Ambulatory Visit (HOSPITAL_COMMUNITY): Payer: Self-pay

## 2024-07-11 MED ORDER — TIRZEPATIDE-WEIGHT MANAGEMENT 7.5 MG/0.5ML ~~LOC~~ SOAJ
7.5000 mg | SUBCUTANEOUS | 11 refills | Status: AC
  Filled 2024-07-11 – 2024-07-19 (×3): qty 2, 28d supply, fill #0
  Filled 2024-09-04: qty 2, 28d supply, fill #1
  Filled 2024-09-26: qty 2, 28d supply, fill #2
  Filled 2024-10-23 – 2024-10-25 (×2): qty 2, 28d supply, fill #3

## 2024-07-18 DIAGNOSIS — F332 Major depressive disorder, recurrent severe without psychotic features: Secondary | ICD-10-CM | POA: Diagnosis not present

## 2024-07-19 ENCOUNTER — Other Ambulatory Visit (HOSPITAL_COMMUNITY): Payer: Self-pay

## 2024-07-24 DIAGNOSIS — H2513 Age-related nuclear cataract, bilateral: Secondary | ICD-10-CM | POA: Diagnosis not present

## 2024-07-24 DIAGNOSIS — H524 Presbyopia: Secondary | ICD-10-CM | POA: Diagnosis not present

## 2024-08-01 DIAGNOSIS — E039 Hypothyroidism, unspecified: Secondary | ICD-10-CM | POA: Diagnosis not present

## 2024-08-01 DIAGNOSIS — E669 Obesity, unspecified: Secondary | ICD-10-CM | POA: Diagnosis not present

## 2024-08-28 DIAGNOSIS — J020 Streptococcal pharyngitis: Secondary | ICD-10-CM | POA: Diagnosis not present

## 2024-09-04 ENCOUNTER — Other Ambulatory Visit (HOSPITAL_COMMUNITY): Payer: Self-pay

## 2024-09-05 DIAGNOSIS — F332 Major depressive disorder, recurrent severe without psychotic features: Secondary | ICD-10-CM | POA: Diagnosis not present

## 2024-09-05 DIAGNOSIS — F321 Major depressive disorder, single episode, moderate: Secondary | ICD-10-CM | POA: Diagnosis not present

## 2024-09-12 DIAGNOSIS — E039 Hypothyroidism, unspecified: Secondary | ICD-10-CM | POA: Diagnosis not present

## 2024-09-26 ENCOUNTER — Other Ambulatory Visit (HOSPITAL_COMMUNITY): Payer: Self-pay

## 2024-09-27 DIAGNOSIS — F321 Major depressive disorder, single episode, moderate: Secondary | ICD-10-CM | POA: Diagnosis not present

## 2024-10-23 ENCOUNTER — Other Ambulatory Visit (HOSPITAL_COMMUNITY): Payer: Self-pay

## 2024-10-24 ENCOUNTER — Other Ambulatory Visit (HOSPITAL_COMMUNITY): Payer: Self-pay

## 2024-10-25 ENCOUNTER — Other Ambulatory Visit (HOSPITAL_COMMUNITY): Payer: Self-pay

## 2024-11-22 ENCOUNTER — Other Ambulatory Visit (HOSPITAL_COMMUNITY): Payer: Self-pay

## 2024-11-22 MED ORDER — ZEPBOUND 10 MG/0.5ML ~~LOC~~ SOAJ
10.0000 mg | SUBCUTANEOUS | 3 refills | Status: AC
  Filled 2024-11-22: qty 2, 28d supply, fill #0

## 2024-11-24 ENCOUNTER — Other Ambulatory Visit (HOSPITAL_COMMUNITY): Payer: Self-pay
